# Patient Record
Sex: Female | Born: 1990 | Race: Black or African American | Hispanic: No | Marital: Single | Smoking: Current some day smoker
Health system: Southern US, Community
[De-identification: ages and names within clinical notes are randomized; demographics above are authoritative.]

## PROBLEM LIST (undated history)

## (undated) ENCOUNTER — Inpatient Hospital Stay (HOSPITAL_COMMUNITY): Payer: No Typology Code available for payment source

## (undated) ENCOUNTER — Ambulatory Visit: Payer: Commercial Managed Care - HMO

## (undated) DIAGNOSIS — F1291 Cannabis use, unspecified, in remission: Secondary | ICD-10-CM

## (undated) DIAGNOSIS — F419 Anxiety disorder, unspecified: Secondary | ICD-10-CM

## (undated) DIAGNOSIS — J939 Pneumothorax, unspecified: Secondary | ICD-10-CM

## (undated) DIAGNOSIS — S329XXA Fracture of unspecified parts of lumbosacral spine and pelvis, initial encounter for closed fracture: Secondary | ICD-10-CM

## (undated) HISTORY — PX: NO PAST SURGERIES: SHX2092

---
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
## 2012-01-31 ENCOUNTER — Encounter (HOSPITAL_COMMUNITY): Payer: Self-pay | Admitting: *Deleted

## 2012-01-31 ENCOUNTER — Inpatient Hospital Stay (HOSPITAL_COMMUNITY)
Admission: AD | Admit: 2012-01-31 | Discharge: 2012-01-31 | Disposition: A | Discharge status: Home / Self Care | Payer: Self-pay | Source: Ambulatory Visit | Attending: Family Medicine | Admitting: Family Medicine

## 2012-01-31 DIAGNOSIS — B9689 Other specified bacterial agents as the cause of diseases classified elsewhere: Secondary | ICD-10-CM | POA: Insufficient documentation

## 2012-01-31 DIAGNOSIS — Z202 Contact with and (suspected) exposure to infections with a predominantly sexual mode of transmission: Secondary | ICD-10-CM | POA: Diagnosis present

## 2012-01-31 DIAGNOSIS — Z3202 Encounter for pregnancy test, result negative: Secondary | ICD-10-CM | POA: Insufficient documentation

## 2012-01-31 DIAGNOSIS — N912 Amenorrhea, unspecified: Secondary | ICD-10-CM | POA: Insufficient documentation

## 2012-01-31 DIAGNOSIS — N760 Acute vaginitis: Secondary | ICD-10-CM | POA: Insufficient documentation

## 2012-01-31 DIAGNOSIS — N911 Secondary amenorrhea: Secondary | ICD-10-CM | POA: Diagnosis present

## 2012-01-31 DIAGNOSIS — A499 Bacterial infection, unspecified: Secondary | ICD-10-CM | POA: Insufficient documentation

## 2012-01-31 HISTORY — DX: Contact with and (suspected) exposure to infections with a predominantly sexual mode of transmission: Z20.2

## 2012-01-31 LAB — URINALYSIS, ROUTINE W REFLEX MICROSCOPIC
Bilirubin Urine: NEGATIVE
Ketones, ur: NEGATIVE mg/dL
Leukocytes, UA: NEGATIVE
Nitrite: NEGATIVE
Protein, ur: NEGATIVE mg/dL
Urobilinogen, UA: 0.2 mg/dL (ref 0.0–1.0)
pH: 8.5 — ABNORMAL HIGH (ref 5.0–8.0)

## 2012-01-31 LAB — CBC
Hemoglobin: 11.5 g/dL — ABNORMAL LOW (ref 12.0–15.0)
MCH: 24.4 pg — ABNORMAL LOW (ref 26.0–34.0)
MCHC: 32.6 g/dL (ref 30.0–36.0)
RDW: 15.3 % (ref 11.5–15.5)

## 2012-01-31 LAB — WET PREP, GENITAL: Yeast Wet Prep HPF POC: NONE SEEN

## 2012-01-31 MED ORDER — METRONIDAZOLE 500 MG PO TABS: 500.0000 mg | ORAL_TABLET | Freq: Two times a day (BID) | ORAL | Status: DC

## 2012-01-31 MED ORDER — ONDANSETRON 8 MG PO TBDP
8.0000 mg | ORAL_TABLET | ORAL | Status: AC
  Administered 2012-01-31: 8 mg via ORAL
  Filled 2012-01-31: qty 1

## 2012-01-31 MED ORDER — CEFTRIAXONE SODIUM 250 MG IJ SOLR
250.0000 mg | INTRAMUSCULAR | Status: AC
  Administered 2012-01-31: 250 mg via INTRAMUSCULAR
  Filled 2012-01-31: qty 250

## 2012-01-31 MED ORDER — AZITHROMYCIN 250 MG PO TABS
1000.0000 mg | ORAL_TABLET | Freq: Once | ORAL | Status: AC
  Administered 2012-01-31: 1000 mg via ORAL
  Filled 2012-01-31: qty 4

## 2012-01-31 NOTE — MAU Provider Note (Signed)
Chief Complaint: Possible Pregnancy   First Provider Initiated Contact with Patient 01/31/12 2016     SUBJECTIVE HPI: Victoria Cline is a 21 y.o. G2P0020 at who presents to maternity admissions reporting positive UPT at home in June, last LMP in May, but no weight gain or pregnancy symptoms.  She denies abdominal pain, vaginal bleeding, vaginal itching/burning, urinary symptoms, h/a, dizziness, n/v, or fever/chills.    After initial history taking, pt reports concern about exposure to STIs.  She requested treatment before her labs today come back.  Past Medical History  Diagnosis Date  . Asthma   . Anxiety    Past Surgical History  Procedure Date  . No past surgeries    History   Social History  . Marital Status: Single    Spouse Name: N/A    Number of Children: N/A  . Years of Education: N/A   Occupational History  . Not on file.   Social History Main Topics  . Smoking status: Current Every Day Smoker -- 0.2 packs/day for 5 years    Types: Cigarettes  . Smokeless tobacco: Not on file  . Alcohol Use: No  . Drug Use: No  . Sexually Active: Yes    Birth Control/ Protection: None   Other Topics Concern  . Not on file   Social History Narrative  . No narrative on file   No current facility-administered medications on file prior to encounter.   No current outpatient prescriptions on file prior to encounter.   No Known Allergies  ROS: Pertinent items in HPI  OBJECTIVE Blood pressure 114/72, pulse 74, temperature 98 F (36.7 C), temperature source Oral, resp. rate 16, height 5\' 3"  (1.6 m), weight 48.807 kg (107 lb 9.6 oz). GENERAL: Well-developed, well-nourished female in no acute distress.  HEENT: Normocephalic HEART: normal rate RESP: normal effort ABDOMEN: Soft, non-tender EXTREMITIES: Nontender, no edema NEURO: Alert and oriented Pelvic exam: Cervix pink, visually closed, without lesion, moderate amount white creamy discharge, vaginal walls and external  genitalia normal Bimanual exam: Cervix 0/long/high, firm, posterior, neg CMT, uterus nontender, nonenlarged, adnexa without tenderness, enlargement, or mass  LAB RESULTS Results for orders placed during the hospital encounter of 01/31/12 (from the past 24 hour(s))  URINALYSIS, ROUTINE W REFLEX MICROSCOPIC     Status: Abnormal   Collection Time   01/31/12  7:52 PM      Component Value Range   Color, Urine YELLOW  YELLOW   APPearance CLEAR  CLEAR   Specific Gravity, Urine 1.020  1.005 - 1.030   pH 8.5 (*) 5.0 - 8.0   Glucose, UA NEGATIVE  NEGATIVE mg/dL   Hgb urine dipstick NEGATIVE  NEGATIVE   Bilirubin Urine NEGATIVE  NEGATIVE   Ketones, ur NEGATIVE  NEGATIVE mg/dL   Protein, ur NEGATIVE  NEGATIVE mg/dL   Urobilinogen, UA 0.2  0.0 - 1.0 mg/dL   Nitrite NEGATIVE  NEGATIVE   Leukocytes, UA NEGATIVE  NEGATIVE  POCT PREGNANCY, URINE     Status: Normal   Collection Time   01/31/12  8:02 PM      Component Value Range   Preg Test, Ur NEGATIVE  NEGATIVE  WET PREP, GENITAL     Status: Abnormal   Collection Time   01/31/12  8:25 PM      Component Value Range   Yeast Wet Prep HPF POC NONE SEEN  NONE SEEN   Trich, Wet Prep NONE SEEN  NONE SEEN   Clue Cells Wet Prep HPF POC MANY (*)  NONE SEEN   WBC, Wet Prep HPF POC FEW (*) NONE SEEN  CBC     Status: Abnormal   Collection Time   01/31/12  8:31 PM      Component Value Range   WBC 7.9  4.0 - 10.5 K/uL   RBC 4.71  3.87 - 5.11 MIL/uL   Hemoglobin 11.5 (*) 12.0 - 15.0 g/dL   HCT 81.1 (*) 91.4 - 78.2 %   MCV 74.9 (*) 78.0 - 100.0 fL   MCH 24.4 (*) 26.0 - 34.0 pg   MCHC 32.6  30.0 - 36.0 g/dL   RDW 95.6  21.3 - 08.6 %   Platelets 296  150 - 400 K/uL    ASSESSMENT 1. Negative pregnancy test   2. Exposure to STD   3. Secondary amenorrhea   4. Bacterial vaginosis     PLAN Rocephin 250 mg IM, Zofran 8 mg ODT, and azithromycin 1000 mg PO x1 dose in MAU Discharge home Reviewed negative pregnancy test results with pt Message sent to  Gyn clinic for f/u for amenorrhea Flagyl 500 mg BID x7 days Return to MAU as needed  Sharen Counter Certified Nurse-Midwife 01/31/2012  8:32 PM

## 2012-01-31 NOTE — MAU Note (Signed)
Pt LMP in May, + home UPT in June.  No prenatal care.  Pt concerned because she is not gaining weight.  Denies pain, bleeding, nausea or vomiting at this time.

## 2012-02-01 LAB — GC/CHLAMYDIA PROBE AMP, GENITAL
Chlamydia, DNA Probe: NEGATIVE
GC Probe Amp, Genital: NEGATIVE

## 2012-02-01 NOTE — MAU Provider Note (Signed)
Chart reviewed and agree with management and plan.  

## 2012-06-10 ENCOUNTER — Emergency Department (HOSPITAL_COMMUNITY): Payer: Self-pay

## 2012-06-10 ENCOUNTER — Emergency Department (HOSPITAL_COMMUNITY)
Admission: EM | Admit: 2012-06-10 | Discharge: 2012-06-10 | Disposition: A | Discharge status: Home / Self Care | Payer: Self-pay | Attending: Emergency Medicine | Admitting: Emergency Medicine

## 2012-06-10 DIAGNOSIS — F17200 Nicotine dependence, unspecified, uncomplicated: Secondary | ICD-10-CM | POA: Insufficient documentation

## 2012-06-10 DIAGNOSIS — R079 Chest pain, unspecified: Secondary | ICD-10-CM | POA: Insufficient documentation

## 2012-06-10 DIAGNOSIS — Z3202 Encounter for pregnancy test, result negative: Secondary | ICD-10-CM | POA: Insufficient documentation

## 2012-06-10 DIAGNOSIS — F411 Generalized anxiety disorder: Secondary | ICD-10-CM | POA: Insufficient documentation

## 2012-06-10 DIAGNOSIS — R1110 Vomiting, unspecified: Secondary | ICD-10-CM | POA: Insufficient documentation

## 2012-06-10 DIAGNOSIS — N390 Urinary tract infection, site not specified: Secondary | ICD-10-CM | POA: Insufficient documentation

## 2012-06-10 DIAGNOSIS — J45909 Unspecified asthma, uncomplicated: Secondary | ICD-10-CM | POA: Insufficient documentation

## 2012-06-10 DIAGNOSIS — R1013 Epigastric pain: Secondary | ICD-10-CM | POA: Insufficient documentation

## 2012-06-10 LAB — URINALYSIS, ROUTINE W REFLEX MICROSCOPIC
Bilirubin Urine: NEGATIVE
Glucose, UA: NEGATIVE mg/dL
Nitrite: NEGATIVE
Specific Gravity, Urine: 1.027 (ref 1.005–1.030)
pH: 7.5 (ref 5.0–8.0)

## 2012-06-10 LAB — COMPREHENSIVE METABOLIC PANEL
ALT: 17 U/L (ref 0–35)
AST: 19 U/L (ref 0–37)
Alkaline Phosphatase: 67 U/L (ref 39–117)
Calcium: 9.9 mg/dL (ref 8.4–10.5)
Potassium: 3.9 mEq/L (ref 3.5–5.1)
Sodium: 134 mEq/L — ABNORMAL LOW (ref 135–145)
Total Protein: 8.3 g/dL (ref 6.0–8.3)

## 2012-06-10 LAB — CBC WITH DIFFERENTIAL/PLATELET
Basophils Absolute: 0 10*3/uL (ref 0.0–0.1)
Eosinophils Absolute: 0 10*3/uL (ref 0.0–0.7)
Eosinophils Relative: 0 % (ref 0–5)
Lymphocytes Relative: 12 % (ref 12–46)
MCH: 24.7 pg — ABNORMAL LOW (ref 26.0–34.0)
MCV: 73.6 fL — ABNORMAL LOW (ref 78.0–100.0)
Neutrophils Relative %: 85 % — ABNORMAL HIGH (ref 43–77)
Platelets: 357 10*3/uL (ref 150–400)
RBC: 5.23 MIL/uL — ABNORMAL HIGH (ref 3.87–5.11)
RDW: 15.1 % (ref 11.5–15.5)
WBC: 9.7 10*3/uL (ref 4.0–10.5)

## 2012-06-10 LAB — URINE MICROSCOPIC-ADD ON

## 2012-06-10 MED ORDER — PROMETHAZINE HCL 25 MG PO TABS: 25.0000 mg | ORAL_TABLET | Freq: Four times a day (QID) | ORAL | Status: DC | PRN

## 2012-06-10 MED ORDER — ONDANSETRON HCL 4 MG/2ML IJ SOLN
4.0000 mg | Freq: Once | INTRAMUSCULAR | Status: AC
  Administered 2012-06-10: 4 mg via INTRAVENOUS
  Filled 2012-06-10: qty 2

## 2012-06-10 MED ORDER — HYDROMORPHONE HCL PF 1 MG/ML IJ SOLN
0.5000 mg | Freq: Once | INTRAMUSCULAR | Status: AC
  Administered 2012-06-10: 0.5 mg via INTRAVENOUS
  Filled 2012-06-10: qty 1

## 2012-06-10 MED ORDER — TRAMADOL HCL 50 MG PO TABS: 50.0000 mg | ORAL_TABLET | Freq: Four times a day (QID) | ORAL | Status: DC | PRN

## 2012-06-10 MED ORDER — CEPHALEXIN 500 MG PO CAPS: 500.0000 mg | ORAL_CAPSULE | Freq: Four times a day (QID) | ORAL | Status: DC

## 2012-06-10 MED ORDER — SODIUM CHLORIDE 0.9 % IV BOLUS (SEPSIS)
1000.0000 mL | Freq: Once | INTRAVENOUS | Status: AC
  Administered 2012-06-10: 1000 mL via INTRAVENOUS

## 2012-06-10 MED ORDER — LORAZEPAM 2 MG/ML IJ SOLN
0.5000 mg | Freq: Once | INTRAMUSCULAR | Status: AC
  Administered 2012-06-10: 0.5 mg via INTRAVENOUS
  Filled 2012-06-10: qty 1

## 2012-06-10 MED ORDER — PANTOPRAZOLE SODIUM 40 MG IV SOLR
40.0000 mg | Freq: Once | INTRAVENOUS | Status: AC
  Administered 2012-06-10: 40 mg via INTRAVENOUS
  Filled 2012-06-10: qty 40

## 2012-06-10 NOTE — ED Notes (Signed)
Pt family and pt report pt has been vomiting since last night multiple times. Pt reports central chest pain  10/10, aching, burning that started this morning. Hx of asthma and anxiety. Pt has not been able to keep fluids down.

## 2012-06-10 NOTE — ED Provider Notes (Signed)
History     CSN: [id number]  Arrival date & time 06/10/12  4098   First MD Initiated Contact with Patient 06/10/12 [phone]      Chief Complaint  Patient presents with  . Abdominal Pain  . Chest Pain    (Consider location/radiation/quality/duration/timing/severity/associated sxs/prior treatment) Patient is a 22 y.o. female presenting with abdominal pain and chest pain. The history is provided by the patient (the pt complains of vomiting).  Abdominal Pain Pain location:  Epigastric Pain quality: aching   Pain radiates to:  Does not radiate Pain severity:  Mild Onset quality:  Sudden Timing:  Constant Progression:  Waxing and waning Chronicity:  New Context: not alcohol use   Associated symptoms: chest pain   Associated symptoms: no cough, no diarrhea, no fatigue and no hematuria   Chest Pain Associated symptoms: abdominal pain   Associated symptoms: no back pain, no cough, no fatigue and no headache     Past Medical History  Diagnosis Date  . Asthma   . Anxiety     Past Surgical History  Procedure Laterality Date  . No past surgeries      No family history on file.  History  Substance Use Topics  . Smoking status: Current Every Day Smoker -- 0.25 packs/day for 5 years    Types: Cigarettes  . Smokeless tobacco: Not on file  . Alcohol Use: No    OB History   Grav Para Term Preterm Abortions TAB SAB Ect Mult Living   2    2  2          Review of Systems  Constitutional: Negative for fatigue.  HENT: Negative for congestion, sinus pressure and ear discharge.   Eyes: Negative for discharge.  Respiratory: Negative for cough.   Cardiovascular: Positive for chest pain.  Gastrointestinal: Positive for abdominal pain. Negative for diarrhea.  Genitourinary: Negative for frequency and hematuria.  Musculoskeletal: Negative for back pain.  Skin: Negative for rash.  Neurological: Negative for seizures and headaches.  Psychiatric/Behavioral: Negative for  hallucinations.    Allergies  Review of patient's allergies indicates no known allergies.  Home Medications   Current Outpatient Rx  Name  Route  Sig  Dispense  Refill  . cephALEXin (KEFLEX) 500 MG capsule   Oral   Take 1 capsule (500 mg total) by mouth 4 (four) times daily.   28 capsule   0   . promethazine (PHENERGAN) 25 MG tablet   Oral   Take 1 tablet (25 mg total) by mouth every 6 (six) hours as needed for nausea.   15 tablet   0   . traMADol (ULTRAM) 50 MG tablet   Oral   Take 1 tablet (50 mg total) by mouth every 6 (six) hours as needed for pain.   15 tablet   0     BP 125/64  Pulse 89  Temp(Src) 98.1 F (36.7 C)  Resp 22  SpO2 100%  LMP 06/10/2012  Physical Exam  Constitutional: She is oriented to person, place, and time. She appears well-developed.  HENT:  Head: Normocephalic and atraumatic.  Eyes: Conjunctivae and EOM are normal. No scleral icterus.  Neck: Neck supple. No thyromegaly present.  Cardiovascular: Normal rate and regular rhythm.  Exam reveals no gallop and no friction rub.   No murmur heard. Pulmonary/Chest: No stridor. She has no wheezes. She has no rales. She exhibits no tenderness.  Abdominal: She exhibits no distension. There is tenderness. There is no rebound.  Musculoskeletal: Normal range  of motion. She exhibits no edema.  Lymphadenopathy:    She has no cervical adenopathy.  Neurological: She is oriented to person, place, and time. Coordination normal.  Skin: No rash noted. No erythema.  Psychiatric: She has a normal mood and affect. Her behavior is normal.    ED Course  Procedures (including critical care time)  Labs Reviewed  CBC WITH DIFFERENTIAL - Abnormal; Notable for the following:    RBC 5.23 (*)    MCV 73.6 (*)    MCH 24.7 (*)    Neutrophils Relative 85 (*)    Neutro Abs 8.2 (*)    All other components within normal limits  COMPREHENSIVE METABOLIC PANEL - Abnormal; Notable for the following:    Sodium 134 (*)     Glucose, Bld 113 (*)    All other components within normal limits  URINALYSIS, ROUTINE W REFLEX MICROSCOPIC - Abnormal; Notable for the following:    APPearance CLOUDY (*)    Hgb urine dipstick MODERATE (*)    Ketones, ur TRACE (*)    Protein, ur 30 (*)    Leukocytes, UA SMALL (*)    All other components within normal limits  URINE MICROSCOPIC-ADD ON - Abnormal; Notable for the following:    Squamous Epithelial / LPF FEW (*)    Bacteria, UA MANY (*)    All other components within normal limits  URINE CULTURE  PREGNANCY, URINE   Dg Abd Acute W/chest  06/10/2012  *RADIOLOGY REPORT*  Clinical Data: Abdomen pain and chest pain.  Nausea and vomiting.  ACUTE ABDOMEN SERIES (ABDOMEN 2 VIEW & CHEST 1 VIEW)  Comparison: Radiographs dated 03/30/2011 and 02/01/2008  Findings: Heart and lungs appear normal.  No free air in the abdomen.  Bowel gas pattern is normal.  No abnormal calcifications. No osseous abnormality.  IMPRESSION: Benign-appearing abdomen and chest.   Original Report Authenticated By: Francene Boyers, M.D.      1. UTI (lower urinary tract infection)   2. Vomiting       MDM          Benny Lennert, MD 06/10/12 1316

## 2012-06-11 LAB — URINE CULTURE: Colony Count: 15000

## 2012-06-24 ENCOUNTER — Emergency Department (HOSPITAL_COMMUNITY): Payer: Self-pay

## 2012-06-24 ENCOUNTER — Emergency Department (HOSPITAL_COMMUNITY)
Admission: EM | Admit: 2012-06-24 | Discharge: 2012-06-24 | Disposition: A | Discharge status: Home / Self Care | Payer: Self-pay | Attending: Emergency Medicine | Admitting: Emergency Medicine

## 2012-06-24 ENCOUNTER — Encounter (HOSPITAL_COMMUNITY): Payer: Self-pay | Admitting: Emergency Medicine

## 2012-06-24 DIAGNOSIS — S43401A Unspecified sprain of right shoulder joint, initial encounter: Secondary | ICD-10-CM

## 2012-06-24 DIAGNOSIS — R209 Unspecified disturbances of skin sensation: Secondary | ICD-10-CM | POA: Insufficient documentation

## 2012-06-24 DIAGNOSIS — Z8659 Personal history of other mental and behavioral disorders: Secondary | ICD-10-CM | POA: Insufficient documentation

## 2012-06-24 DIAGNOSIS — F17200 Nicotine dependence, unspecified, uncomplicated: Secondary | ICD-10-CM | POA: Insufficient documentation

## 2012-06-24 DIAGNOSIS — IMO0002 Reserved for concepts with insufficient information to code with codable children: Secondary | ICD-10-CM | POA: Insufficient documentation

## 2012-06-24 DIAGNOSIS — J45909 Unspecified asthma, uncomplicated: Secondary | ICD-10-CM | POA: Insufficient documentation

## 2012-06-24 MED ORDER — HYDROCODONE-ACETAMINOPHEN 5-325 MG PO TABS: 1.0000 | ORAL_TABLET | Freq: Four times a day (QID) | ORAL | Status: DC | PRN

## 2012-06-24 MED ORDER — HYDROCODONE-ACETAMINOPHEN 5-325 MG PO TABS
1.0000 | ORAL_TABLET | Freq: Once | ORAL | Status: AC
  Administered 2012-06-24: 1 via ORAL
  Filled 2012-06-24: qty 1
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
## 2012-06-24 NOTE — ED Provider Notes (Signed)
Medical screening examination/treatment/procedure(s) were performed by non-physician practitioner and as supervising physician I was immediately available for consultation/collaboration.   David H Yao, MD 06/24/12 1146 

## 2012-08-01 ENCOUNTER — Emergency Department (HOSPITAL_COMMUNITY)
Admission: EM | Admit: 2012-08-01 | Discharge: 2012-08-01 | Disposition: A | Discharge status: Home / Self Care | Payer: Self-pay | Attending: Emergency Medicine | Admitting: Emergency Medicine

## 2012-08-01 ENCOUNTER — Encounter (HOSPITAL_COMMUNITY): Payer: Self-pay | Admitting: Emergency Medicine

## 2012-08-01 DIAGNOSIS — IMO0001 Reserved for inherently not codable concepts without codable children: Secondary | ICD-10-CM | POA: Insufficient documentation

## 2012-08-01 DIAGNOSIS — R51 Headache: Secondary | ICD-10-CM | POA: Insufficient documentation

## 2012-08-01 DIAGNOSIS — O99330 Smoking (tobacco) complicating pregnancy, unspecified trimester: Secondary | ICD-10-CM | POA: Insufficient documentation

## 2012-08-01 DIAGNOSIS — Z8659 Personal history of other mental and behavioral disorders: Secondary | ICD-10-CM | POA: Insufficient documentation

## 2012-08-01 DIAGNOSIS — J45909 Unspecified asthma, uncomplicated: Secondary | ICD-10-CM | POA: Insufficient documentation

## 2012-08-01 DIAGNOSIS — R112 Nausea with vomiting, unspecified: Secondary | ICD-10-CM

## 2012-08-01 DIAGNOSIS — O210 Mild hyperemesis gravidarum: Secondary | ICD-10-CM | POA: Insufficient documentation

## 2012-08-01 DIAGNOSIS — O9989 Other specified diseases and conditions complicating pregnancy, childbirth and the puerperium: Secondary | ICD-10-CM | POA: Insufficient documentation

## 2012-08-01 LAB — URINALYSIS, ROUTINE W REFLEX MICROSCOPIC
Ketones, ur: >80 mg/dL — AB
Leukocytes, UA: NEGATIVE
Protein, ur: NEGATIVE mg/dL
Urobilinogen, UA: 0.2 mg/dL (ref 0.0–1.0)

## 2012-08-01 LAB — CBC WITH DIFFERENTIAL/PLATELET
Basophils Absolute: 0 10*3/uL (ref 0.0–0.1)
Basophils Relative: 0 % (ref 0–1)
Eosinophils Absolute: 0.1 10*3/uL (ref 0.0–0.7)
Eosinophils Relative: 1 % (ref 0–5)
Lymphocytes Relative: 15 % (ref 12–46)
MCHC: 33.7 g/dL (ref 30.0–36.0)
MCV: 72.7 fL — ABNORMAL LOW (ref 78.0–100.0)
Platelets: 343 10*3/uL (ref 150–400)
RDW: 15.1 % (ref 11.5–15.5)
WBC: 8.2 10*3/uL (ref 4.0–10.5)

## 2012-08-01 LAB — PREGNANCY, URINE: Preg Test, Ur: NEGATIVE

## 2012-08-01 LAB — BASIC METABOLIC PANEL
Calcium: 9.6 mg/dL (ref 8.4–10.5)
Creatinine, Ser: 0.7 mg/dL (ref 0.50–1.10)
GFR calc Af Amer: >90 mL/min (ref >90)
GFR calc non Af Amer: >90 mL/min (ref >90)
Sodium: 138 mEq/L (ref 135–145)

## 2012-08-01 MED ORDER — METOCLOPRAMIDE HCL 5 MG/ML IJ SOLN
10.0000 mg | Freq: Once | INTRAMUSCULAR | Status: AC
  Administered 2012-08-01: 10 mg via INTRAVENOUS
  Filled 2012-08-01: qty 2

## 2012-08-01 MED ORDER — ACETAMINOPHEN 10 MG/ML IV SOLN
1000.0000 mg | Freq: Once | INTRAVENOUS | Status: AC
  Administered 2012-08-01: 1000 mg via INTRAVENOUS
  Filled 2012-08-01: qty 100

## 2012-08-01 MED ORDER — ONDANSETRON HCL 4 MG/2ML IJ SOLN
4.0000 mg | Freq: Once | INTRAMUSCULAR | Status: AC
  Administered 2012-08-01: 4 mg via INTRAVENOUS
  Filled 2012-08-01: qty 2

## 2012-08-01 MED ORDER — DIPHENHYDRAMINE HCL 50 MG/ML IJ SOLN
25.0000 mg | Freq: Once | INTRAMUSCULAR | Status: AC
  Administered 2012-08-01: 25 mg via INTRAVENOUS
  Filled 2012-08-01: qty 1

## 2012-08-01 MED ORDER — PROMETHAZINE HCL 25 MG PO TABS: 25.0000 mg | ORAL_TABLET | Freq: Four times a day (QID) | ORAL | Status: DC | PRN

## 2012-08-01 MED ORDER — SODIUM CHLORIDE 0.9 % IV BOLUS (SEPSIS)
1000.0000 mL | Freq: Once | INTRAVENOUS | Status: AC
  Administered 2012-08-01: 1000 mL via INTRAVENOUS

## 2012-08-01 MED ORDER — PROMETHAZINE HCL 25 MG/ML IJ SOLN
25.0000 mg | Freq: Once | INTRAMUSCULAR | Status: AC
  Administered 2012-08-01: 25 mg via INTRAVENOUS
  Filled 2012-08-01: qty 1

## 2012-08-01 MED ORDER — ONDANSETRON HCL 4 MG/2ML IJ SOLN
INTRAMUSCULAR | Status: AC
  Administered 2012-08-01: 4 mg via INTRAVENOUS
  Filled 2012-08-01: qty 2

## 2012-08-01 MED ORDER — KETOROLAC TROMETHAMINE 30 MG/ML IJ SOLN: 30.0000 mg | Freq: Once | INTRAMUSCULAR | Status: DC

## 2012-08-01 MED ORDER — DIPHENHYDRAMINE HCL 50 MG/ML IJ SOLN: 25.0000 mg | Freq: Once | INTRAMUSCULAR | Status: DC

## 2012-08-01 MED ORDER — ONDANSETRON HCL 4 MG/2ML IJ SOLN: 4.0000 mg | Freq: Once | INTRAMUSCULAR | Status: AC

## 2012-08-01 NOTE — ED Notes (Signed)
PA notified that patient is still vomiting.

## 2012-08-01 NOTE — ED Notes (Signed)
Pt c/o generalized pain, with nausea and vomiting, pt is 4 months pregnant, states she is having small amount of spotting which she has had intermittently since pregnant, denies abdominal cramping at present, vomiting in waiting room and room

## 2012-08-01 NOTE — ED Notes (Signed)
Hold medications until patient urinates and urine sample obtained.

## 2012-08-01 NOTE — ED Provider Notes (Signed)
History     CSN: [id number]  Arrival date & time 08/01/12  1326   First MD Initiated Contact with Patient 08/01/12 1512      No chief complaint on file.   (Consider location/radiation/quality/duration/timing/severity/associated sxs/prior treatment) HPI Comments: Patient is a 22 year old G1P0 female who presents with headache and body aches that patient woke up with this morning. Patient is 4 months pregnant. Patient reports a gradual onset and progressive worsening of the headache. The pain is sharp, constant and is located in generalized head without radiation. The body aches are "all over her body" and severe. Patient has tried nothing for symptoms without relief. No alleviating/aggravating factors. Patient reports associated nausea and vomiting. Patient denies fever, vomiting, diarrhea, numbness/tingling, weakness, visual changes, congestion, chest pain, SOB, abdominal pain.      Past Medical History  Diagnosis Date  . Asthma   . Anxiety     Past Surgical History  Procedure Laterality Date  . No past surgeries      No family history on file.  History  Substance Use Topics  . Smoking status: Current Every Day Smoker -- 0.25 packs/day for 5 years    Types: Cigarettes  . Smokeless tobacco: Never Used  . Alcohol Use: No    OB History   Grav Para Term Preterm Abortions TAB SAB Ect Mult Living   3    2  2          Review of Systems  Gastrointestinal: Positive for nausea and vomiting.  Musculoskeletal: Positive for myalgias.  Neurological: Positive for headaches.  All other systems reviewed and are negative.    Allergies  Review of patient's allergies indicates no known allergies.  Home Medications   Current Outpatient Rx  Name  Route  Sig  Dispense  Refill  . albuterol (PROVENTIL HFA;VENTOLIN HFA) 108 (90 BASE) MCG/ACT inhaler   Inhalation   Inhale 2 puffs into the lungs every 6 (six) hours as needed for wheezing.         . Prenatal Vit-Fe Fumarate-FA  (PRENATAL MULTIVITAMIN) TABS   Oral   Take 1 tablet by mouth daily.           BP 113/79  Pulse 87  Temp(Src) 98.8 F (37.1 C) (Oral)  SpO2 100%  LMP 06/10/2012  Physical Exam  Nursing note and vitals reviewed. Constitutional: She is oriented to person, place, and time. She appears well-developed and well-nourished. No distress.  HENT:  Head: Normocephalic and atraumatic.  Eyes: Conjunctivae and EOM are normal. Pupils are equal, round, and reactive to light. No scleral icterus.  Neck: Normal range of motion. Neck supple.  Cardiovascular: Normal rate and regular rhythm.  Exam reveals no gallop and no friction rub.   No murmur heard. Pulmonary/Chest: Effort normal and breath sounds normal. She has no wheezes. She has no rales. She exhibits no tenderness.  Abdominal: Soft. She exhibits distension. There is no tenderness. There is no rebound and no guarding.  Distension consistent with pregnancy. No tenderness to palpation.   Musculoskeletal: Normal range of motion.  Neurological: She is alert and oriented to person, place, and time. Coordination normal.  Speech is goal-oriented. Moves limbs without ataxia.   Skin: Skin is warm and dry.  Psychiatric: She has a normal mood and affect. Her behavior is normal.    ED Course  Procedures (including critical care time)  Labs Reviewed  CBC WITH DIFFERENTIAL - Abnormal; Notable for the following:    MCV 72.7 (*)  MCH 24.5 (*)    Neutrophils Relative 79 (*)    All other components within normal limits  URINALYSIS, ROUTINE W REFLEX MICROSCOPIC - Abnormal; Notable for the following:    APPearance CLOUDY (*)    Hgb urine dipstick LARGE (*)    Ketones, ur >80 (*)    All other components within normal limits  URINE MICROSCOPIC-ADD ON - Abnormal; Notable for the following:    Bacteria, UA FEW (*)    All other components within normal limits  BASIC METABOLIC PANEL  PREGNANCY, URINE   No results found.   1. Nausea and vomiting        MDM  3:14 PM Basic labs pending. Patient will have fluids and benadryl. Vitals stable and patient is afebrile.   6:53 PM Patient continues to vomit despite 2 doses of zofran. Patient given IV tylenol for pain.   7:11 PM Patient is not pregnant and is now changing her initial history. Patient will be discharged with phenergan for nausea. Vitals stable and patient afebrile. Labs show no signs of dehydration. No further evaluation needed. Patient instructed to return with worsening or concerning symptoms.      Emilia Beck, PA-C 08/01/12 1954

## 2012-08-01 NOTE — ED Notes (Signed)
Patient continues to be nauseated with pain.  PA notified.

## 2012-08-01 NOTE — ED Notes (Signed)
Pt sts she woke this AM and has pain "everywhere" as well as a HA. Pt also stated she has been vomiting this am. Pt sts she is 4 months pregnant. Pt does not have an OB/GYN. Denies any vaginal concerns. Pt has never been pregnant in the past.

## 2012-08-01 NOTE — ED Provider Notes (Signed)
Medical screening examination/treatment/procedure(s) were performed by non-physician practitioner and as supervising physician I was immediately available for consultation/collaboration.    Tonimarie Gritz R Shivangi Lutz, MD 08/01/12 2015 

## 2012-08-08 ENCOUNTER — Emergency Department (HOSPITAL_COMMUNITY)
Admission: EM | Admit: 2012-08-08 | Discharge: 2012-08-08 | Disposition: A | Discharge status: Home / Self Care | Payer: Self-pay

## 2012-08-08 NOTE — ED Notes (Signed)
Pt noted to be cursing during check in and threatening to pass out which was voiced more than once. Pt observed leaving lobby after speaking briefly with Security.

## 2012-12-03 ENCOUNTER — Emergency Department (HOSPITAL_COMMUNITY)
Admission: EM | Admit: 2012-12-03 | Discharge: 2012-12-03 | Disposition: A | Discharge status: Home / Self Care | Payer: Self-pay | Attending: Emergency Medicine | Admitting: Emergency Medicine

## 2012-12-03 ENCOUNTER — Encounter (HOSPITAL_COMMUNITY): Payer: Self-pay | Admitting: Emergency Medicine

## 2012-12-03 DIAGNOSIS — R350 Frequency of micturition: Secondary | ICD-10-CM | POA: Insufficient documentation

## 2012-12-03 DIAGNOSIS — R1084 Generalized abdominal pain: Secondary | ICD-10-CM | POA: Insufficient documentation

## 2012-12-03 DIAGNOSIS — R112 Nausea with vomiting, unspecified: Secondary | ICD-10-CM | POA: Insufficient documentation

## 2012-12-03 DIAGNOSIS — R3915 Urgency of urination: Secondary | ICD-10-CM | POA: Insufficient documentation

## 2012-12-03 DIAGNOSIS — Z3202 Encounter for pregnancy test, result negative: Secondary | ICD-10-CM | POA: Insufficient documentation

## 2012-12-03 DIAGNOSIS — R197 Diarrhea, unspecified: Secondary | ICD-10-CM | POA: Insufficient documentation

## 2012-12-03 DIAGNOSIS — N898 Other specified noninflammatory disorders of vagina: Secondary | ICD-10-CM | POA: Insufficient documentation

## 2012-12-03 DIAGNOSIS — R109 Unspecified abdominal pain: Secondary | ICD-10-CM

## 2012-12-03 DIAGNOSIS — F17200 Nicotine dependence, unspecified, uncomplicated: Secondary | ICD-10-CM | POA: Insufficient documentation

## 2012-12-03 DIAGNOSIS — R52 Pain, unspecified: Secondary | ICD-10-CM | POA: Insufficient documentation

## 2012-12-03 DIAGNOSIS — J45909 Unspecified asthma, uncomplicated: Secondary | ICD-10-CM | POA: Insufficient documentation

## 2012-12-03 DIAGNOSIS — Z8659 Personal history of other mental and behavioral disorders: Secondary | ICD-10-CM | POA: Insufficient documentation

## 2012-12-03 DIAGNOSIS — IMO0001 Reserved for inherently not codable concepts without codable children: Secondary | ICD-10-CM | POA: Insufficient documentation

## 2012-12-03 LAB — COMPREHENSIVE METABOLIC PANEL
Albumin: 3.9 g/dL (ref 3.5–5.2)
Alkaline Phosphatase: 84 U/L (ref 39–117)
BUN: 5 mg/dL — ABNORMAL LOW (ref 6–23)
Chloride: 104 mEq/L (ref 96–112)
Creatinine, Ser: 0.73 mg/dL (ref 0.50–1.10)
GFR calc Af Amer: >90 mL/min (ref >90)
Glucose, Bld: 99 mg/dL (ref 70–99)
Potassium: 4 mEq/L (ref 3.5–5.1)
Total Bilirubin: 0.3 mg/dL (ref 0.3–1.2)

## 2012-12-03 LAB — URINALYSIS, ROUTINE W REFLEX MICROSCOPIC
Bilirubin Urine: NEGATIVE
Ketones, ur: NEGATIVE mg/dL
Leukocytes, UA: NEGATIVE
Nitrite: NEGATIVE
Protein, ur: NEGATIVE mg/dL
Urobilinogen, UA: 0.2 mg/dL (ref 0.0–1.0)

## 2012-12-03 LAB — CBC WITH DIFFERENTIAL/PLATELET
Basophils Relative: 1 % (ref 0–1)
Eosinophils Absolute: 0.1 10*3/uL (ref 0.0–0.7)
HCT: 35.5 % — ABNORMAL LOW (ref 36.0–46.0)
Hemoglobin: 11.2 g/dL — ABNORMAL LOW (ref 12.0–15.0)
Lymphs Abs: 1.6 10*3/uL (ref 0.7–4.0)
MCH: 23.7 pg — ABNORMAL LOW (ref 26.0–34.0)
MCHC: 31.5 g/dL (ref 30.0–36.0)
Monocytes Absolute: 0.3 10*3/uL (ref 0.1–1.0)
Monocytes Relative: 5 % (ref 3–12)
Neutro Abs: 3.6 10*3/uL (ref 1.7–7.7)
Neutrophils Relative %: 64 % (ref 43–77)
RBC: 4.73 MIL/uL (ref 3.87–5.11)

## 2012-12-03 LAB — URINE MICROSCOPIC-ADD ON

## 2012-12-03 LAB — LIPASE, BLOOD: Lipase: 26 U/L (ref 11–59)

## 2012-12-03 MED ORDER — DICYCLOMINE HCL 20 MG PO TABS: 20.0000 mg | ORAL_TABLET | Freq: Two times a day (BID) | ORAL | Status: DC | PRN

## 2012-12-03 MED ORDER — ONDANSETRON HCL 4 MG PO TABS: 4.0000 mg | ORAL_TABLET | Freq: Three times a day (TID) | ORAL | Status: DC | PRN

## 2012-12-03 MED ORDER — SODIUM CHLORIDE 0.9 % IV BOLUS (SEPSIS)
1000.0000 mL | Freq: Once | INTRAVENOUS | Status: AC
  Administered 2012-12-03: 1000 mL via INTRAVENOUS

## 2012-12-03 MED ORDER — ONDANSETRON HCL 4 MG/2ML IJ SOLN
4.0000 mg | Freq: Once | INTRAMUSCULAR | Status: AC
Start: 2012-12-03 — End: 2012-12-03
  Administered 2012-12-03: 4 mg via INTRAVENOUS
  Filled 2012-12-03: qty 2

## 2012-12-03 MED ORDER — KETOROLAC TROMETHAMINE 30 MG/ML IJ SOLN
30.0000 mg | Freq: Once | INTRAMUSCULAR | Status: AC
  Administered 2012-12-03: 30 mg via INTRAVENOUS
  Filled 2012-12-03: qty 1

## 2012-12-03 NOTE — ED Notes (Signed)
MD at bedside. 

## 2012-12-03 NOTE — ED Provider Notes (Signed)
CSN: [id number]     Arrival date & time 12/03/12  1328 History     First MD Initiated Contact with Patient 12/03/12 1405     Chief Complaint  Patient presents with  . Generalized Body Aches  . Nausea  . Emesis  . Diarrhea   (Consider location/radiation/quality/duration/timing/severity/associated sxs/prior Treatment) HPI Comments: Patient reports one day of body aches, N/V/D.  Emesis x 5 - green/yellow, then clear.  Diarrhea x 3.  Is also have urinary urgency and frequency x weeks.  Had unprotected sex 2 months ago. Has had two positive and one negative pregnancy test last month, currently "menstruating," and has had normal periods since the unprotected sex- states women in her family have periods while they are pregnant.  Pt is taking prenatal vitamins but has not seen an OB yet.  If she is pregnant she is G3P0 with two spontaneous abortions at around 3 months.  Denies eating abnormal foods, denies sick contacts, denies recent travel.   Patient is a 22 y.o. female presenting with vomiting and diarrhea. The history is provided by the patient.  Emesis Associated symptoms: abdominal pain, diarrhea and myalgias   Associated symptoms: no chills   Diarrhea Associated symptoms: abdominal pain, myalgias and vomiting   Associated symptoms: no chills and no fever     Past Medical History  Diagnosis Date  . Asthma   . Anxiety    Past Surgical History  Procedure Laterality Date  . No past surgeries     No family history on file. History  Substance Use Topics  . Smoking status: Current Every Day Smoker -- 0.25 packs/day for 5 years    Types: Cigarettes  . Smokeless tobacco: Never Used  . Alcohol Use: No   OB History   Grav Para Term Preterm Abortions TAB SAB Ect Mult Living   3    2  2         Review of Systems  Constitutional: Negative for fever and chills.  Gastrointestinal: Positive for nausea, vomiting, abdominal pain and diarrhea.  Genitourinary: Positive for urgency,  frequency and vaginal bleeding. Negative for dysuria, vaginal discharge and menstrual problem.  Musculoskeletal: Positive for myalgias.    Allergies  Review of patient's allergies indicates no known allergies.  Home Medications   Current Outpatient Rx  Name  Route  Sig  Dispense  Refill  . albuterol (PROVENTIL HFA;VENTOLIN HFA) 108 (90 BASE) MCG/ACT inhaler   Inhalation   Inhale 2 puffs into the lungs every 6 (six) hours as needed for wheezing.         . promethazine (PHENERGAN) 25 MG tablet   Oral   Take 1 tablet (25 mg total) by mouth every 6 (six) hours as needed for nausea.   12 tablet   0    BP 114/71  Pulse 69  Temp(Src) 98.6 F (37 C) (Oral)  Resp 16  SpO2 100%  LMP 10/08/2012 Physical Exam  Nursing note and vitals reviewed. Constitutional: She appears well-developed and well-nourished. No distress.  HENT:  Head: Normocephalic and atraumatic.  Neck: Neck supple.  Cardiovascular: Normal rate and regular rhythm.   Pulmonary/Chest: Effort normal and breath sounds normal. No respiratory distress. She has no wheezes. She has no rales.  Abdominal: Soft. She exhibits no distension. There is generalized tenderness. There is no rebound and no guarding.  Neurological: She is alert.  Skin: She is not diaphoretic.    ED Course   Procedures (including critical care time)  Labs Reviewed  CBC  WITH DIFFERENTIAL - Abnormal; Notable for the following:    Hemoglobin 11.2 (*)    HCT 35.5 (*)    MCV 75.1 (*)    MCH 23.7 (*)    All other components within normal limits  COMPREHENSIVE METABOLIC PANEL - Abnormal; Notable for the following:    BUN 5 (*)    All other components within normal limits  URINALYSIS, ROUTINE W REFLEX MICROSCOPIC - Abnormal; Notable for the following:    Hgb urine dipstick LARGE (*)    All other components within normal limits  LIPASE, BLOOD  URINE MICROSCOPIC-ADD ON  POCT PREGNANCY, URINE   No results found.  I have discussed with the  patient that if she is pregnant, most medications will reach the baby.  I explained that zofran is currently commonly used in pregnancy but there is no guarantee that it will continue to be used or considered safe for her or the baby.  Pt verbalizes understanding and states she would like to have zofran.     3:40 PM Abdominal exam is unchanged.    No diagnosis found.  MDM  Pt with N/V/D and generalized crampy abdominal pain that began today.  Unknown etiology - likely viral vs food poisoning.  Question of whether she might be pregnant because of unprotected sex two months ago and positive pregnancy test over a month ago.  Pregnancy test is negative here.  D/C home with symptomatic medications bentyl and zofran. Discussed all results with patient.  Pt given return precautions.  Pt verbalizes understanding and agrees with plan.      Trixie Dredge, PA-C 12/03/12 1543

## 2012-12-03 NOTE — ED Notes (Signed)
When asked when pt's last period was, pt stated that she didn't know.  I asked if she had gotten one last month.  Pt stated yes.  The LMP that was in epic was in February and said that the pt was pregnant.  Pt states that she lost that baby and now she is 2 months pregnant again (she thinks).  Unsure of due date b/c pt does not know date of LMP.

## 2012-12-03 NOTE — ED Notes (Signed)
Pt c/o body aches, NVD since this morning.  Has thrown up 5 times today.

## 2012-12-03 NOTE — ED Provider Notes (Signed)
Medical screening examination/treatment/procedure(s) were performed by non-physician practitioner and as supervising physician I was immediately available for consultation/collaboration. Devoria Albe, MD, Armando Gang   Ward Givens, MD 12/03/12 [phone]

## 2013-04-25 ENCOUNTER — Emergency Department (HOSPITAL_COMMUNITY)
Admission: EM | Admit: 2013-04-25 | Discharge: 2013-04-25 | Disposition: A | Discharge status: Home / Self Care | Payer: Medicaid Other | Attending: Emergency Medicine | Admitting: Emergency Medicine

## 2013-04-25 ENCOUNTER — Encounter (HOSPITAL_COMMUNITY): Payer: Self-pay | Admitting: Emergency Medicine

## 2013-04-25 DIAGNOSIS — J45909 Unspecified asthma, uncomplicated: Secondary | ICD-10-CM | POA: Insufficient documentation

## 2013-04-25 DIAGNOSIS — F411 Generalized anxiety disorder: Secondary | ICD-10-CM | POA: Insufficient documentation

## 2013-04-25 DIAGNOSIS — R112 Nausea with vomiting, unspecified: Secondary | ICD-10-CM | POA: Insufficient documentation

## 2013-04-25 DIAGNOSIS — F17200 Nicotine dependence, unspecified, uncomplicated: Secondary | ICD-10-CM | POA: Insufficient documentation

## 2013-04-25 DIAGNOSIS — R52 Pain, unspecified: Secondary | ICD-10-CM | POA: Insufficient documentation

## 2013-04-25 MED ORDER — OXYCODONE-ACETAMINOPHEN 5-325 MG PO TABS
1.0000 | ORAL_TABLET | Freq: Once | ORAL | Status: AC
  Administered 2013-04-25: 1 via ORAL
  Filled 2013-04-25: qty 1

## 2013-04-25 MED ORDER — ONDANSETRON HCL 4 MG/2ML IJ SOLN
4.0000 mg | Freq: Once | INTRAMUSCULAR | Status: AC
  Administered 2013-04-25: 4 mg via INTRAVENOUS
  Filled 2013-04-25: qty 2

## 2013-04-25 NOTE — ED Notes (Signed)
Pt presents to department for evaluation of nausea/vomiting and body aches. Onset today. Denies pain at the time. Pt is alert and oriented x4. Denies urinary symptoms. Denies vaginal symptoms. No signs of acute distress noted.

## 2013-04-25 NOTE — ED Notes (Signed)
Pt left. Did not say anything to RN or tech.

## 2013-04-25 NOTE — ED Notes (Signed)
Pt took out IV

## 2013-05-17 ENCOUNTER — Emergency Department (HOSPITAL_COMMUNITY)
Admission: EM | Admit: 2013-05-17 | Discharge: 2013-05-17 | Disposition: A | Discharge status: Home / Self Care | Payer: Medicaid Other | Attending: Emergency Medicine | Admitting: Emergency Medicine

## 2013-05-17 ENCOUNTER — Encounter (HOSPITAL_COMMUNITY): Payer: Self-pay | Admitting: Emergency Medicine

## 2013-05-17 DIAGNOSIS — O9989 Other specified diseases and conditions complicating pregnancy, childbirth and the puerperium: Secondary | ICD-10-CM | POA: Insufficient documentation

## 2013-05-17 DIAGNOSIS — Z8659 Personal history of other mental and behavioral disorders: Secondary | ICD-10-CM | POA: Insufficient documentation

## 2013-05-17 DIAGNOSIS — Z792 Long term (current) use of antibiotics: Secondary | ICD-10-CM | POA: Insufficient documentation

## 2013-05-17 DIAGNOSIS — Z79899 Other long term (current) drug therapy: Secondary | ICD-10-CM | POA: Insufficient documentation

## 2013-05-17 DIAGNOSIS — J069 Acute upper respiratory infection, unspecified: Secondary | ICD-10-CM | POA: Insufficient documentation

## 2013-05-17 DIAGNOSIS — H9209 Otalgia, unspecified ear: Secondary | ICD-10-CM | POA: Insufficient documentation

## 2013-05-17 DIAGNOSIS — Z7982 Long term (current) use of aspirin: Secondary | ICD-10-CM | POA: Insufficient documentation

## 2013-05-17 DIAGNOSIS — O99330 Smoking (tobacco) complicating pregnancy, unspecified trimester: Secondary | ICD-10-CM | POA: Insufficient documentation

## 2013-05-17 DIAGNOSIS — Z3490 Encounter for supervision of normal pregnancy, unspecified, unspecified trimester: Secondary | ICD-10-CM

## 2013-05-17 DIAGNOSIS — R112 Nausea with vomiting, unspecified: Secondary | ICD-10-CM | POA: Insufficient documentation

## 2013-05-17 DIAGNOSIS — O2390 Unspecified genitourinary tract infection in pregnancy, unspecified trimester: Secondary | ICD-10-CM | POA: Insufficient documentation

## 2013-05-17 DIAGNOSIS — N390 Urinary tract infection, site not specified: Secondary | ICD-10-CM | POA: Insufficient documentation

## 2013-05-17 DIAGNOSIS — J45909 Unspecified asthma, uncomplicated: Secondary | ICD-10-CM | POA: Insufficient documentation

## 2013-05-17 DIAGNOSIS — R197 Diarrhea, unspecified: Secondary | ICD-10-CM | POA: Insufficient documentation

## 2013-05-17 LAB — URINALYSIS, ROUTINE W REFLEX MICROSCOPIC
BILIRUBIN URINE: NEGATIVE
Glucose, UA: NEGATIVE mg/dL
KETONES UR: 15 mg/dL — AB
Nitrite: POSITIVE — AB
PROTEIN: NEGATIVE mg/dL
Specific Gravity, Urine: 1.015 (ref 1.005–1.030)
UROBILINOGEN UA: 0.2 mg/dL (ref 0.0–1.0)
pH: 7 (ref 5.0–8.0)

## 2013-05-17 LAB — URINE MICROSCOPIC-ADD ON

## 2013-05-17 LAB — POCT I-STAT, CHEM 8
BUN: 5 mg/dL — ABNORMAL LOW (ref 6–23)
CALCIUM ION: 1.29 mmol/L — AB (ref 1.12–1.23)
CREATININE: 0.7 mg/dL (ref 0.50–1.10)
Chloride: 102 mEq/L (ref 96–112)
GLUCOSE: 93 mg/dL (ref 70–99)
HCT: 44 % (ref 36.0–46.0)
HEMOGLOBIN: 15 g/dL (ref 12.0–15.0)
POTASSIUM: 3.7 meq/L (ref 3.7–5.3)
Sodium: 138 mEq/L (ref 137–147)
TCO2: 25 mmol/L (ref 0–100)

## 2013-05-17 MED ORDER — SODIUM CHLORIDE 0.9 % IV BOLUS (SEPSIS)
1000.0000 mL | Freq: Once | INTRAVENOUS | Status: AC
  Administered 2013-05-17: 1000 mL via INTRAVENOUS

## 2013-05-17 MED ORDER — NITROFURANTOIN MONOHYD MACRO 100 MG PO CAPS
100.0000 mg | ORAL_CAPSULE | Freq: Once | ORAL | Status: AC
  Administered 2013-05-17: 100 mg via ORAL
  Filled 2013-05-17: qty 1

## 2013-05-17 MED ORDER — NITROFURANTOIN MONOHYD MACRO 100 MG PO CAPS: 100.0000 mg | ORAL_CAPSULE | Freq: Two times a day (BID) | ORAL | Status: DC

## 2013-05-17 MED ORDER — AZITHROMYCIN 250 MG PO TABS: 500.0000 mg | ORAL_TABLET | Freq: Once | ORAL | Status: DC

## 2013-05-17 MED ORDER — SODIUM CHLORIDE 0.9 % IV SOLN
1000.0000 mL | Freq: Once | INTRAVENOUS | Status: AC
  Administered 2013-05-17: 1000 mL via INTRAVENOUS

## 2013-05-17 MED ORDER — ONDANSETRON HCL 4 MG/2ML IJ SOLN
4.0000 mg | Freq: Once | INTRAMUSCULAR | Status: AC
  Administered 2013-05-17: 4 mg via INTRAVENOUS
  Filled 2013-05-17: qty 2

## 2013-05-17 MED ORDER — AZITHROMYCIN 250 MG PO TABS: 250.0000 mg | ORAL_TABLET | Freq: Every day | ORAL | Status: DC

## 2013-05-17 MED ORDER — PRENATAL COMPLETE 14-0.4 MG PO TABS: 1.0000 | ORAL_TABLET | Freq: Two times a day (BID) | ORAL | Status: DC

## 2013-05-17 MED ORDER — OXYCODONE-ACETAMINOPHEN 5-325 MG PO TABS
1.0000 | ORAL_TABLET | Freq: Once | ORAL | Status: AC
  Administered 2013-05-17: 1 via ORAL
  Filled 2013-05-17: qty 1

## 2013-05-17 NOTE — ED Provider Notes (Signed)
CSN: [id number]     Arrival date & time 05/17/13  1502 History   First MD Initiated Contact with Patient 05/17/13 1734     Chief Complaint  Patient presents with  . Flu like symptoms    (Consider location/radiation/quality/duration/timing/severity/associated sxs/prior Treatment) HPI  Patient is a 23 yo AA female who presents today with flu-like symptoms ongoing for about a week. Patient recently found out with home pregnancy test she is approximately 4-[redacted] weeks pregnant. Has not had any prenatal care and is currently taking OTC prenatal vitamins. States she has generalized body aches, fatigue, a persistent dry cough, and a runny nose. Also complains of bilateral ear pain. Has had nausea and vomiting for the past three days, with about three episodes of vomiting per day without pain in her flank or abdomen. Recent decrease in fluid and food intake. Has noticed recent odor change in urine and slight discomfort with urination, but denies blood in urine or color change. Denies chest pain, headaches, abdominal pain, diarrhea, hematochezia, and hematemesis. Patient is afebrile and has not taken any medication for fever and BP is 116/63.    Past Medical History  Diagnosis Date  . Asthma   . Anxiety    Past Surgical History  Procedure Laterality Date  . No past surgeries     History reviewed. No pertinent family history. History  Substance Use Topics  . Smoking status: Current Every Day Smoker -- 0.25 packs/day for 5 years    Types: Cigarettes  . Smokeless tobacco: Never Used  . Alcohol Use: No   OB History   Grav Para Term Preterm Abortions TAB SAB Ect Mult Living   3    2  2         Review of Systems  Constitutional: Positive for fatigue. Negative for fever.  HENT: Positive for congestion, ear pain and rhinorrhea.        Nasal congestion with yellow drainage. Bilateral ear pain.   Respiratory: Negative for chest tightness and wheezing.   Cardiovascular: Negative for chest pain.   Gastrointestinal: Positive for nausea, vomiting and diarrhea. Negative for abdominal pain and blood in stool.  Genitourinary: Negative for frequency and flank pain.       States some mild discomfort with urination. Has noticed recent odor change in urine. States "feels like I have a UTI"     Allergies  Review of patient's allergies indicates no known allergies.  Home Medications   Current Outpatient Rx  Name  Route  Sig  Dispense  Refill  . albuterol (PROVENTIL HFA;VENTOLIN HFA) 108 (90 BASE) MCG/ACT inhaler   Inhalation   Inhale 2 puffs into the lungs every 6 (six) hours as needed for wheezing.         [name] aspirin 325 MG tablet   Oral   Take 1,300 mg by mouth daily as needed for moderate pain.          . nitrofurantoin, macrocrystal-monohydrate, (MACROBID) 100 MG capsule   Oral   Take 1 capsule (100 mg total) by mouth 2 (two) times daily.   10 capsule   0   . Prenatal Vit-Fe Fumarate-FA (PRENATAL COMPLETE) 14-0.4 MG TABS   Oral   Take 1 tablet by mouth 2 (two) times daily.   60 each   2    BP 116/63  Pulse 103  Temp(Src) 98.9 F (37.2 C) (Oral)  Resp 16  Wt 130 lb (58.968 kg)  SpO2 100%  LMP 03/19/2013 Physical Exam  Constitutional: She is  oriented to person, place, and time. She appears well-developed and well-nourished.  HENT:  Head: Normocephalic.  Mouth/Throat: Oropharynx is clear and moist. No oropharyngeal exudate.  Patient states some mild discomfort during examination of ear. Some redness along both canals. Both canals clear of any exudate and both TMs visualized. Nasal passages clear with some clear rhinorrhea.   Eyes: Conjunctivae are normal. Pupils are equal, round, and reactive to light.  Cardiovascular: Normal rate, regular rhythm and normal heart sounds.   Pulmonary/Chest: Effort normal and breath sounds normal. No respiratory distress. She has no wheezes. She has no rales.  Abdominal: Soft. Bowel sounds are normal. There is no tenderness.   Neurological: She is alert and oriented to person, place, and time.    ED Course  Procedures (including critical care time) Labs Review Labs Reviewed  URINALYSIS, ROUTINE W REFLEX MICROSCOPIC - Abnormal; Notable for the following:    Hgb urine dipstick TRACE (*)    Ketones, ur 15 (*)    Nitrite POSITIVE (*)    Leukocytes, UA TRACE (*)    All other components within normal limits  URINE MICROSCOPIC-ADD ON - Abnormal; Notable for the following:    Squamous Epithelial / LPF MANY (*)    Bacteria, UA MANY (*)    All other components within normal limits  POCT I-STAT, CHEM 8 - Abnormal; Notable for the following:    BUN 5 (*)    Calcium, Ion 1.29 (*)    All other components within normal limits  URINE CULTURE  URINE CULTURE   Imaging Review No results found.  EKG Interpretation   None       MDM   1. Pregnant   2. URI (upper respiratory infection)   3. UTI (lower urinary tract infection)    Patient received 2 L of saline in the. Nurse has documented her pulse at 110. When coughing the pulse does elevate but in the room, patient resting it was in the high 80's.  Her I-stat and urine tests are not concerning.  I feel that this is more bronchitis, I did not xray to avoid excess radiation during pregnancy.   [facility]  22 y.o.Manuella GhaziSamira Najaah Fossum's evaluation in the Emergency Department is complete. It has been determined that no acute conditions requiring further emergency intervention are present at this time. The patient/guardian have been advised of the diagnosis and plan. We have discussed signs and symptoms that warrant return to the ED, such as changes or worsening in symptoms.  Vital signs are stable at discharge. Filed Vitals:   05/17/13 1830  BP: 116/63  Pulse: 103  Temp:   Resp:     Patient/guardian has voiced understanding and agreed to follow-up with the PCP or specialist.     Dorthula Matasiffany G Khalidah Herbold, PA-C 05/17/13  1942

## 2013-05-17 NOTE — ED Notes (Signed)
PA at bedside.

## 2013-05-17 NOTE — Discharge Instructions (Signed)
Urinary Tract Infection Urinary tract infections (UTIs) can develop anywhere along your urinary tract. Your urinary tract is your body's drainage system for removing wastes and extra water. Your urinary tract includes two kidneys, two ureters, a bladder, and a urethra. Your kidneys are a pair of bean-shaped organs. Each kidney is about the size of your fist. They are located below your ribs, one on each side of your spine. CAUSES Infections are caused by microbes, which are microscopic organisms, including fungi, viruses, and bacteria. These organisms are so small that they can only be seen through a microscope. Bacteria are the microbes that most commonly cause UTIs. SYMPTOMS  Symptoms of UTIs may vary by age and gender of the patient and by the location of the infection. Symptoms in young women typically include a frequent and intense urge to urinate and a painful, burning feeling in the bladder or urethra during urination. Older women and men are more likely to be tired, shaky, and weak and have muscle aches and abdominal pain. A fever may mean the infection is in your kidneys. Other symptoms of a kidney infection include pain in your back or sides below the ribs, nausea, and vomiting. DIAGNOSIS To diagnose a UTI, your caregiver will ask you about your symptoms. Your caregiver also will ask to provide a urine sample. The urine sample will be tested for bacteria and white blood cells. White blood cells are made by your body to help fight infection. TREATMENT  Typically, UTIs can be treated with medication. Because most UTIs are caused by a bacterial infection, they usually can be treated with the use of antibiotics. The choice of antibiotic and length of treatment depend on your symptoms and the type of bacteria causing your infection. HOME CARE INSTRUCTIONS  If you were prescribed antibiotics, take them exactly as your caregiver instructs you. Finish the medication even if you feel better after you  have only taken some of the medication.  Drink enough water and fluids to keep your urine clear or pale yellow.  Avoid caffeine, tea, and carbonated beverages. They tend to irritate your bladder.  Empty your bladder often. Avoid holding urine for long periods of time.  Empty your bladder before and after sexual intercourse.  After a bowel movement, women should cleanse from front to back. Use each tissue only once. SEEK MEDICAL CARE IF:   You have back pain.  You develop a fever.  Your symptoms do not begin to resolve within 3 days. SEEK IMMEDIATE MEDICAL CARE IF:   You have severe back pain or lower abdominal pain.  You develop chills.  You have nausea or vomiting.  You have continued burning or discomfort with urination. MAKE SURE YOU:   Understand these instructions.  Will watch your condition.  Will get help right away if you are not doing well or get worse. Document Released: 01/07/2005 Document Revised: 09/29/2011 Document Reviewed: 05/08/2011 [facility] Patient Information 2014 [place].  Bronchitis Bronchitis is inflammation of the airways that extend from the windpipe into the lungs (bronchi). The inflammation often causes mucus to develop, which leads to a cough. If the inflammation becomes severe, it may cause shortness of breath. CAUSES  Bronchitis may be caused by:   Viral infections.   Bacteria.   Cigarette smoke.   Allergens, pollutants, and other irritants.  SIGNS AND SYMPTOMS  The most common symptom of bronchitis is a frequent cough that produces mucus. Other symptoms include:  Fever.   Body aches.   Chest congestion.  Chills.   Shortness of breath.   Sore throat.  DIAGNOSIS  Bronchitis is usually diagnosed through a medical history and physical exam. Tests, such as chest X-rays, are sometimes done to rule out other conditions.  TREATMENT  You may need to avoid contact with whatever caused the problem (smoking, for  example). Medicines are sometimes needed. These may include:  Antibiotics. These may be prescribed if the condition is caused by bacteria.  Cough suppressants. These may be prescribed for relief of cough symptoms.   Inhaled medicines. These may be prescribed to help open your airways and make it easier for you to breathe.   Steroid medicines. These may be prescribed for those with recurrent (chronic) bronchitis. HOME CARE INSTRUCTIONS  Get plenty of rest.   Drink enough fluids to keep your urine clear or pale yellow (unless you have a medical condition that requires fluid restriction). Increasing fluids may help thin your secretions and will prevent dehydration.   Only take over-the-counter or prescription medicines as directed by your health care provider.  Only take antibiotics as directed. Make sure you finish them even if you start to feel better.  Avoid secondhand smoke, irritating chemicals, and strong fumes. These will make bronchitis worse. If you are a smoker, quit smoking. Consider using nicotine gum or skin patches to help control withdrawal symptoms. Quitting smoking will help your lungs heal faster.   Put a cool-mist humidifier in your bedroom at night to moisten the air. This may help loosen mucus. Change the water in the humidifier daily. You can also run the hot water in your shower and sit in the bathroom with the door closed for 5 10 minutes.   Follow up with your health care provider as directed.   Wash your hands frequently to avoid catching bronchitis again or spreading an infection to others.  SEEK MEDICAL CARE IF: Your symptoms do not improve after 1 week of treatment.  SEEK IMMEDIATE MEDICAL CARE IF:  Your fever increases.  You have chills.   You have chest pain.   You have worsening shortness of breath.   You have bloody sputum.  You faint.  You have lightheadedness.  You have a severe headache.   You vomit repeatedly. MAKE SURE  YOU:   Understand these instructions.  Will watch your condition.  Will get help right away if you are not doing well or get worse. Document Released: 03/30/2005 Document Revised: 01/18/2013 Document Reviewed: 11/22/2012 [facility] Patient Information 2014 [place], [place]. Pregnancy - First Trimester During sexual intercourse, millions of sperm go into the vagina. Only 1 sperm will penetrate and fertilize the female egg while it is in the Fallopian tube. One week later, the fertilized egg implants into the wall of the uterus. An embryo begins to develop into a baby. At 6 to 8 weeks, the eyes and face are formed and the heartbeat can be seen on ultrasound. At the end of 12 weeks (first trimester), all the baby's organs are formed. Now that you are pregnant, you will want to do everything you can to have a healthy baby. Two of the most important things are to get good prenatal care and follow your caregiver's instructions. Prenatal care is all the medical care you receive before the baby's birth. It is given to prevent, find, and treat problems during the pregnancy and childbirth. PRENATAL EXAMS  During prenatal visits, your weight, blood pressure, and urine are checked. This is done to make sure you are healthy and progressing normally  during the pregnancy.  A pregnant woman should gain 25 to 35 pounds during the pregnancy. However, if you are overweight or underweight, your caregiver will advise you regarding your weight.  Your caregiver will ask and answer questions for you.  Blood work, cervical cultures, other necessary tests, and a Pap test are done during your prenatal exams. These tests are done to check on your health and the probable health of your baby. Tests are strongly recommended and done for HIV with your permission. This is the virus that causes AIDS. These tests are done because medicines can be given to help prevent your baby from being born with this infection should you have been  infected without knowing it. Blood work is also used to find out your blood type, previous infections, and follow your blood levels (hemoglobin).  Low hemoglobin (anemia) is common during pregnancy. Iron and vitamins are given to help prevent this. Later in the pregnancy, blood tests for diabetes will be done along with any other tests if any problems develop.  You may need other tests to make sure you and the baby are doing well. CHANGES DURING THE FIRST TRIMESTER  Your body goes through many changes during pregnancy. They vary from person to person. Talk to your caregiver about changes you notice and are concerned about. Changes can include:  Your menstrual period stops.  The egg and sperm carry the genes that determine what you look like. Genes from you and your partner are forming a baby. The female genes determine whether the baby is a boy or a girl.  Your body increases in girth and you may feel bloated.  Feeling sick to your stomach (nauseous) and throwing up (vomiting). If the vomiting is uncontrollable, call your caregiver.  Your breasts will begin to enlarge and become tender.  Your nipples may stick out more and become darker.  The need to urinate more. Painful urination may mean you have a bladder infection.  Tiring easily.  Loss of appetite.  Cravings for certain kinds of food.  At first, you may gain or lose a couple of pounds.  You may have changes in your emotions from day to day (excited to be pregnant or concerned something may go wrong with the pregnancy and baby).  You may have more vivid and strange dreams. HOME CARE INSTRUCTIONS   It is very important to avoid all smoking, alcohol and non-prescribed drugs during your pregnancy. These affect the formation and growth of the baby. Avoid chemicals while pregnant to ensure the delivery of a healthy infant.  Start your prenatal visits by the 12th week of pregnancy. They are usually scheduled monthly at first, then  more often in the last 2 months before delivery. Keep your caregiver's appointments. Follow your caregiver's instructions regarding medicine use, blood and lab tests, exercise, and diet.  During pregnancy, you are providing food for you and your baby. Eat regular, well-balanced meals. Choose foods such as meat, fish, milk and other low fat dairy products, vegetables, fruits, and whole-grain breads and cereals. Your caregiver will tell you of the ideal weight gain.  You can help morning sickness by keeping soda crackers at the bedside. Eat a couple before arising in the morning. You may want to use the crackers without salt on them.  Eating 4 to 5 small meals rather than 3 large meals a day also may help the nausea and vomiting.  Drinking liquids between meals instead of during meals also seems to help nausea and  vomiting.  A physical sexual relationship may be continued throughout pregnancy if there are no other problems. Problems may be early (premature) leaking of amniotic fluid from the membranes, vaginal bleeding, or belly (abdominal) pain.  Exercise regularly if there are no restrictions. Check with your caregiver or physical therapist if you are unsure of the safety of some of your exercises. Greater weight gain will occur in the last 2 trimesters of pregnancy. Exercising will help:  Control your weight.  Keep you in shape.  Prepare you for labor and delivery.  Help you lose your pregnancy weight after you deliver your baby.  Wear a good support or jogging bra for breast tenderness during pregnancy. This may help if worn during sleep too.  Ask when prenatal classes are available. Begin classes when they are offered.  Do not use hot tubs, steam rooms, or saunas.  Wear your seat belt when driving. This protects you and your baby if you are in an accident.  Avoid raw meat, uncooked cheese, cat litter boxes, and soil used by cats throughout the pregnancy. These carry germs that can  cause birth defects in the baby.  The first trimester is a good time to visit your dentist for your dental health. Getting your teeth cleaned is okay. Use a softer toothbrush and brush gently during pregnancy.  Ask for help if you have financial, counseling, or nutritional needs during pregnancy. Your caregiver will be able to offer counseling for these needs as well as refer you for other special needs.  Do not take any medicines or herbs unless told by your caregiver.  Inform your caregiver if there is any mental or physical domestic violence.  Make a list of emergency phone numbers of family, friends, hospital, and police and fire departments.  Write down your questions. Take them to your prenatal visit.  Do not douche.  Do not cross your legs.  If you have to stand for long periods of time, rotate you feet or take small steps in a circle.  You may have more vaginal secretions that may require a sanitary pad. Do not use tampons or scented sanitary pads. MEDICINES AND DRUG USE IN PREGNANCY  Take prenatal vitamins as directed. The vitamin should contain 1 milligram of folic acid. Keep all vitamins out of reach of children. Only a couple vitamins or tablets containing iron may be fatal to a baby or young child when ingested.  Avoid use of all medicines, including herbs, over-the-counter medicines, not prescribed or suggested by your caregiver. Only take over-the-counter or prescription medicines for pain, discomfort, or fever as directed by your caregiver. Do not use aspirin, ibuprofen, or naproxen unless directed by your caregiver.  Let your caregiver also know about herbs you may be using.  Alcohol is related to a number of birth defects. This includes fetal alcohol syndrome. All alcohol, in any form, should be avoided completely. Smoking will cause low birth rate and premature babies.  Street or illegal drugs are very harmful to the baby. They are absolutely forbidden. A baby born  to an addicted mother will be addicted at birth. The baby will go through the same withdrawal an adult does.  Let your caregiver know about any medicines that you have to take and for what reason you take them. SEEK MEDICAL CARE IF:  You have any concerns or worries during your pregnancy. It is better to call with your questions if you feel they cannot wait, rather than worry about them. SEEK IMMEDIATE  MEDICAL CARE IF:   An unexplained oral temperature above 102 F (38.9 C) develops, or as your caregiver suggests.  You have leaking of fluid from the vagina (birth canal). If leaking membranes are suspected, take your temperature and inform your caregiver of this when you call.  There is vaginal spotting or bleeding. Notify your caregiver of the amount and how many pads are used.  You develop a bad smelling vaginal discharge with a change in the color.  You continue to feel sick to your stomach (nauseated) and have no relief from remedies suggested. You vomit blood or coffee ground-like materials.  You lose more than 2 pounds of weight in 1 week.  You gain more than 2 pounds of weight in 1 week and you notice swelling of your face, hands, feet, or legs.  You gain 5 pounds or more in 1 week (even if you do not have swelling of your hands, face, legs, or feet).  You get exposed to [place] measles and have never had them.  You are exposed to fifth disease or chickenpox.  You develop belly (abdominal) pain. Round ligament discomfort is a common non-cancerous (benign) cause of abdominal pain in pregnancy. Your caregiver still must evaluate this.  You develop headache, fever, diarrhea, pain with urination, or shortness of breath.  You fall or are in a car accident or have any kind of trauma.  There is mental or physical violence in your home. Document Released: 03/24/2001 Document Revised: 12/23/2011 Document Reviewed: 09/25/2008 [facility] Patient Information 2014 [place], [place].  Folic  Acid in Pregnancy Folic acid is a B vitamin that helps prevent neural tube defects (NTDs). The neural tube is the part of a developing baby that becomes the brain and spinal cord. When the neural tube does not close properly, a baby is born with an NTD. NTDs include spina bifida, hernia of the spinal cord, and the absence of part of, or all of the brain (anencephaly).  Take folic acid at least 4 weeks before getting pregnant and through the first 3 months of pregnancy. This is when the neural tube is developing. It is available in most multivitamins, as a folic acid-only supplement, and in some foods. Taking the right amount of folic acid before conception and during pregnancy lessens the chances of having a baby born with an NTD. Giving folic acid will not affect a neural tube defect if it is already present. DIAGNOSIS   An Alpha-Fetoprotein (AFP) blood or amniotic fluid test will show high levels of the alpha-feto protein if a woman is carrying a baby with an NTD. This test is done on all pregnant women in the first trimester.  An ultrasound may detect an NTD. WHAT YOU CAN DO:  Take a multivitamin with at least 0.4 milligrams (400 micrograms) of folic acid daily at least 4 weeks before getting pregnant and through the first 12 weeks of pregnancy.  If you have already had a pregnancy affected by an NTD, take 4 milligrams (4,000 micrograms) of folic acid daily. Take this amount 1 month before you start trying to get pregnant and continue through the first 3 months of pregnancy. If you have a seizure disorder or take medicines to control seizures, tell your maternity care provider. Continue to take your folic acid unless you are told otherwise.  FOLIC ACID IN FOODS Eat a healthy diet that has foods that contain folic acid, the natural form of the vitamin. Such foods include:  Fortified breakfast cereals.  Lentils.  Asparagus.  Spinach.  Organ meats (liver).  Black beans.  Peanuts (eat  only if you do not have a peanut allergy).  Broccoli.  Strawberries, oranges.  Orange juice (from concentrate is best).  Enriched breads and pasta.  Romaine lettuce. TALK TO YOUR HEALTH CARE PROVIDER IF:  You are in your first trimester and have high blood sugar.  You are in your first trimester and develop a high fever. In almost all cases, a fetus found to have an NTD will need specialized care that may not be available in all hospitals. Talk to your health care provider about what is best for you and your baby. Document Released: 04/02/2003 Document Revised: 01/18/2013 Document Reviewed: 07/03/2009 [facility] Patient Information 2014 [place], [place].

## 2013-05-17 NOTE — ED Notes (Addendum)
Pt in c/o cough, congestion, fever and vomiting over the last week, also body aches and chills, no medication PTA. Pt states she is approx 4-[redacted] weeks pregnant. Denies lower abd cramping or vaginal bleeding.

## 2013-05-19 LAB — URINE CULTURE

## 2013-05-19 NOTE — ED Provider Notes (Signed)
Medical screening examination/treatment/procedure(s) were performed by non-physician practitioner and as supervising physician I was immediately available for consultation/collaboration.  EKG Interpretation   None         [name] [name], DO 05/19/13 1427 

## 2013-05-20 ENCOUNTER — Telehealth (HOSPITAL_COMMUNITY): Payer: Self-pay | Admitting: Emergency Medicine

## 2013-05-20 NOTE — ED Notes (Signed)
Post ED Visit - Positive Culture Follow-up  Culture report reviewed by antimicrobial stewardship pharmacist: []  Wes Dulaney, Pharm.D., BCPS [x]  Celedonio MiyamotoJeremy Frens, Pharm.D., BCPS []  Georgina PillionElizabeth Martin, [address]Pharm[address]D., BCPS []  [place] Pham, [place]arm[place] BCPS, AAHIVP []  Estella HuskMichelle Turner, Pharm.D., BCPS, AAHIVP  Positive urine culture Treated with Macrobid, organism sensitive to the same and no further patient follow-up is required at this time.  [place], Victoria LucksKylie 05/20/2013, 9:28 AM

## 2013-08-08 ENCOUNTER — Emergency Department (HOSPITAL_COMMUNITY)
Admission: EM | Admit: 2013-08-08 | Discharge: 2013-08-08 | Discharge status: Against Medical Advice | Payer: Medicaid Other | Attending: Emergency Medicine | Admitting: Emergency Medicine

## 2013-08-08 ENCOUNTER — Emergency Department (HOSPITAL_COMMUNITY): Payer: Medicaid Other

## 2013-08-08 ENCOUNTER — Encounter (HOSPITAL_COMMUNITY): Payer: Self-pay | Admitting: Emergency Medicine

## 2013-08-08 DIAGNOSIS — Z87828 Personal history of other (healed) physical injury and trauma: Secondary | ICD-10-CM | POA: Insufficient documentation

## 2013-08-08 DIAGNOSIS — J45909 Unspecified asthma, uncomplicated: Secondary | ICD-10-CM | POA: Insufficient documentation

## 2013-08-08 DIAGNOSIS — N938 Other specified abnormal uterine and vaginal bleeding: Secondary | ICD-10-CM | POA: Insufficient documentation

## 2013-08-08 DIAGNOSIS — N949 Unspecified condition associated with female genital organs and menstrual cycle: Secondary | ICD-10-CM | POA: Insufficient documentation

## 2013-08-08 DIAGNOSIS — R102 Pelvic and perineal pain: Secondary | ICD-10-CM

## 2013-08-08 DIAGNOSIS — Z8659 Personal history of other mental and behavioral disorders: Secondary | ICD-10-CM | POA: Insufficient documentation

## 2013-08-08 DIAGNOSIS — F17200 Nicotine dependence, unspecified, uncomplicated: Secondary | ICD-10-CM | POA: Insufficient documentation

## 2013-08-08 DIAGNOSIS — N390 Urinary tract infection, site not specified: Secondary | ICD-10-CM | POA: Insufficient documentation

## 2013-08-08 DIAGNOSIS — Z3202 Encounter for pregnancy test, result negative: Secondary | ICD-10-CM | POA: Insufficient documentation

## 2013-08-08 DIAGNOSIS — Z8781 Personal history of (healed) traumatic fracture: Secondary | ICD-10-CM | POA: Insufficient documentation

## 2013-08-08 DIAGNOSIS — Z79899 Other long term (current) drug therapy: Secondary | ICD-10-CM | POA: Insufficient documentation

## 2013-08-08 DIAGNOSIS — N925 Other specified irregular menstruation: Secondary | ICD-10-CM | POA: Insufficient documentation

## 2013-08-08 HISTORY — DX: Fracture of unspecified parts of lumbosacral spine and pelvis, initial encounter for closed fracture: S32.9XXA

## 2013-08-08 LAB — URINE MICROSCOPIC-ADD ON

## 2013-08-08 LAB — URINALYSIS, ROUTINE W REFLEX MICROSCOPIC
Bilirubin Urine: NEGATIVE
GLUCOSE, UA: NEGATIVE mg/dL
KETONES UR: NEGATIVE mg/dL
Nitrite: POSITIVE — AB
PH: 6 (ref 5.0–8.0)
Protein, ur: NEGATIVE mg/dL
Specific Gravity, Urine: 1.019 (ref 1.005–1.030)
Urobilinogen, UA: 0.2 mg/dL (ref 0.0–1.0)

## 2013-08-08 LAB — HCG, QUANTITATIVE, PREGNANCY: hCG, Beta Chain, Quant, S: <1 m[IU]/mL (ref <5)

## 2013-08-08 LAB — POC URINE PREG, ED: Preg Test, Ur: NEGATIVE

## 2013-08-08 MED ORDER — MORPHINE SULFATE 4 MG/ML IJ SOLN
4.0000 mg | Freq: Once | INTRAMUSCULAR | Status: AC
  Administered 2013-08-08: 4 mg via INTRAMUSCULAR
  Filled 2013-08-08: qty 1

## 2013-08-08 MED ORDER — KETOROLAC TROMETHAMINE 60 MG/2ML IM SOLN: 15.0000 mg | Freq: Once | INTRAMUSCULAR | Status: DC

## 2013-08-08 MED ORDER — PREDNISONE 20 MG PO TABS
60.0000 mg | ORAL_TABLET | Freq: Once | ORAL | Status: AC
  Administered 2013-08-08: 60 mg via ORAL
  Filled 2013-08-08: qty 3

## 2013-08-08 MED ORDER — DEXAMETHASONE SODIUM PHOSPHATE 10 MG/ML IJ SOLN: 10.0000 mg | Freq: Once | INTRAMUSCULAR | Status: DC

## 2013-08-08 NOTE — ED Notes (Signed)
Pt not in room to receive d/c instructions.  

## 2013-08-08 NOTE — ED Notes (Signed)
Pt states that when she was [age]yr old she had an accident and broke her pelvic bone. For past week pt c/o of pelvic pain. Pt denies any trouble using the bathroom.

## 2013-08-08 NOTE — ED Notes (Signed)
Pt has a ride home.  

## 2013-08-08 NOTE — ED Notes (Signed)
Patient transported to X-ray 
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
## 2013-08-10 LAB — URINE CULTURE
Colony Count: >=100000
SPECIAL REQUESTS: NORMAL

## 2013-08-10 NOTE — ED Provider Notes (Signed)
Medical screening examination/treatment/procedure(s) were performed by non-physician practitioner and as supervising physician I was immediately available for consultation/collaboration.   EKG Interpretation None       Taquila Leys, MD 08/10/13 1321 

## 2013-08-11 ENCOUNTER — Telehealth (HOSPITAL_BASED_OUTPATIENT_CLINIC_OR_DEPARTMENT_OTHER): Payer: Self-pay | Admitting: Emergency Medicine

## 2013-08-11 NOTE — Progress Notes (Signed)
ED Antimicrobial Stewardship Positive Culture Follow Up   Victoria GhaziSamira Najaah Cline is an 23 y.o. female who [facility] with a chief complaint of pelvic pain  Chief Complaint  Patient presents with  . Pelvic Pain    Recent Results (from the past 720 hour(s))  URINE CULTURE     Status: None   Collection Time    08/08/13  5:26 PM      Result Value Ref Range Status   Specimen Description URINE, CLEAN CATCH   Final   Special Requests Normal   Final   Culture  Setup Time     Final   Value: 08/09/2013 01:03     Performed at [facility]   Colony Count     Final   Value: >=100,000 COLONIES/ML     [facility]formed at [facility]   Culture     Final   Value: ESCHERICHIA COLI     Performed at [facility]   Report Status 08/10/2013 FINAL   Final   Organism ID, Bacteria ESCHERICHIA COLI   Final    [x]  Patient discharged originally without antimicrobial agent and treatment is now indicated  22 YOF who [facility] with c/o pelvic pain (pt has hx broke pelvic bone ~2 years ago). Work-up revealed a dirty UA however pt left prior to further testing (i.e. CT of the pelvis). UCx grew E. Coli -- would recommend treating.   New antibiotic prescription: Bactrim DS 1 tab bid x 5 days  ED Provider: Ebbie Ridgehris Lawyer, PA-C  [name] 08/11/2013, 9:45 AM Infectious Diseases Pharmacist Phone# [phone]

## 2013-08-11 NOTE — Telephone Encounter (Signed)
Post ED Visit - Positive Culture Follow-up: Successful Patient Follow-Up  Culture assessed and recommendations reviewed by: []  Wes Dulaney, Pharm.D., BCPS []  Celedonio MiyamotoJeremy Frens, Pharm.D., BCPS [x]  Georgina PillionElizabeth Martin, [address] BoulevardPharm.D., BCPS []  HeathcoteMinh Pham, [place]arm[place]D., BCPS, AAHIVP []  Estella HuskMichelle Turner, Pharm.D., BCPS, AAHIVP  Positive urine culture  [x]  Patient discharged without antimicrobial prescription and treatment is now indicated []  Organism is resistant to prescribed ED discharge antimicrobial []  Patient with positive blood cultures  Changes discussed with ED provider: Ebbie Ridgehris Lawyer PA-C New antibiotic prescription: Bactrim DS 1 tab BID x 5 days    [facility] Cline 08/11/2013, 2:47 PM

## 2013-10-31 ENCOUNTER — Encounter (HOSPITAL_COMMUNITY): Payer: Self-pay | Admitting: Emergency Medicine

## 2013-10-31 ENCOUNTER — Emergency Department (HOSPITAL_COMMUNITY)
Admission: EM | Admit: 2013-10-31 | Discharge: 2013-10-31 | Disposition: A | Discharge status: Home / Self Care | Payer: Medicaid Other | Attending: Emergency Medicine | Admitting: Emergency Medicine

## 2013-10-31 ENCOUNTER — Emergency Department (HOSPITAL_COMMUNITY): Payer: Medicaid Other

## 2013-10-31 DIAGNOSIS — I319 Disease of pericardium, unspecified: Secondary | ICD-10-CM | POA: Diagnosis not present

## 2013-10-31 DIAGNOSIS — R079 Chest pain, unspecified: Secondary | ICD-10-CM | POA: Diagnosis present

## 2013-10-31 DIAGNOSIS — F419 Anxiety disorder, unspecified: Secondary | ICD-10-CM

## 2013-10-31 DIAGNOSIS — Z8781 Personal history of (healed) traumatic fracture: Secondary | ICD-10-CM | POA: Insufficient documentation

## 2013-10-31 DIAGNOSIS — F411 Generalized anxiety disorder: Secondary | ICD-10-CM | POA: Insufficient documentation

## 2013-10-31 DIAGNOSIS — J45901 Unspecified asthma with (acute) exacerbation: Secondary | ICD-10-CM | POA: Insufficient documentation

## 2013-10-31 DIAGNOSIS — Z79899 Other long term (current) drug therapy: Secondary | ICD-10-CM | POA: Diagnosis not present

## 2013-10-31 DIAGNOSIS — F17200 Nicotine dependence, unspecified, uncomplicated: Secondary | ICD-10-CM | POA: Insufficient documentation

## 2013-10-31 MED ORDER — LORAZEPAM 1 MG PO TABS
1.0000 mg | ORAL_TABLET | Freq: Once | ORAL | Status: AC
  Administered 2013-10-31: 1 mg via ORAL
  Filled 2013-10-31: qty 1

## 2013-10-31 MED ORDER — IBUPROFEN 600 MG PO TABS: 600.0000 mg | ORAL_TABLET | Freq: Three times a day (TID) | ORAL | Status: DC

## 2013-10-31 MED ORDER — KETOROLAC TROMETHAMINE 30 MG/ML IJ SOLN
30.0000 mg | Freq: Once | INTRAMUSCULAR | Status: AC
  Administered 2013-10-31: 30 mg via INTRAVENOUS
  Filled 2013-10-31: qty 1
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
## 2013-10-31 NOTE — ED Notes (Signed)
Pt ambulated with standby assist to nearby restroom to void.  

## 2013-10-31 NOTE — ED Notes (Addendum)
Pt states "my heart hurts and my body is numb." Pt is teary.
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
## 2013-10-31 NOTE — ED Notes (Signed)
Bed: WA11 Expected date:  Expected time:  Means of arrival:  Comments: EMS 

## 2013-11-01 NOTE — ED Provider Notes (Signed)
Medical screening examination/treatment/procedure(s) were performed by non-physician practitioner and as supervising physician I was immediately available for consultation/collaboration.   EKG Interpretation   Date/Time:  Tuesday October 31 2013 06:26:04 EDT Ventricular Rate:  77 PR Interval:  125 QRS Duration: 89 QT Interval:  403 QTC Calculation: 456 R Axis:   90 Text Interpretation:  Sinus rhythm Borderline right axis deviation RSR' in  V1 or V2, probably normal variant Probable left ventricular hypertrophy ST  elevation suggests acute pericarditis Confirmed by WARD,  DO, KRISTEN  [phone] on 10/31/2013 8:22:38 AM        Victoria CoKevin M Lorelie Biermann, MD 11/01/13 0400

## 2013-11-23 ENCOUNTER — Encounter: Payer: Self-pay | Admitting: *Deleted

## 2013-11-28 ENCOUNTER — Ambulatory Visit (INDEPENDENT_AMBULATORY_CARE_PROVIDER_SITE_OTHER): Payer: Medicaid Other | Admitting: Cardiology

## 2013-11-28 ENCOUNTER — Encounter: Payer: Self-pay | Admitting: Cardiology

## 2013-11-28 VITALS — BP 120/80 | HR 94 | Ht 63.0 in

## 2013-11-28 DIAGNOSIS — R002 Palpitations: Secondary | ICD-10-CM

## 2013-11-28 LAB — TSH: TSH: 1.989 u[IU]/mL (ref 0.350–4.500)

## 2013-11-28 NOTE — Progress Notes (Signed)
HPI The patient presents for evaluation of chest discomfort. She has no past cardiac history. She was in the emergency room in late July I reviewed these. She told her that they have fluid around her heart but I see no evidence of this. In particular a chest x-ray demonstrated no edema. The patient reports that daily with any activity or emotional stress her heart will race. She also developed chest discomfort which is under her left breast and in the mid chest. It is sharp. It is 10 out of 10 in intensity. She gets nauseated short of breath and diaphoretic. He maintains hours before he goes away. She has been told in the past she has panic attacks but I am not clear on this workup for this diagnosis was made. She limits her activities because of this discomfort. He denies any PND or orthopnea. She's had no presyncope or syncope. She has had multiple emergency room visits for various complaints.  No Known Allergies  Current Outpatient Prescriptions  Medication Sig Dispense Refill  . albuterol (PROVENTIL HFA;VENTOLIN HFA) 108 (90 BASE) MCG/ACT inhaler Inhale 2 puffs into the lungs every 6 (six) hours as needed for wheezing.      [name] ibuprofen (ADVIL,MOTRIN) 600 MG tablet Take 1 tablet (600 mg total) by mouth every 8 (eight) hours.  42 tablet  0   No current facility-administered medications for this visit.    Past Medical History  Diagnosis Date  . Asthma   . Anxiety   . Fracture, pelvis closed     Past Surgical History  Procedure Laterality Date  . No past surgeries      No family history on file.  History   Social History  . Marital Status: Single    Spouse Name: N/A    Number of Children: N/A  . Years of Education: N/A   Occupational History  . Not on file.   Social History Main Topics  . Smoking status: Current Every Day Smoker -- 0.25 packs/day for 5 years    Types: Cigarettes  . Smokeless tobacco: Never Used  . Alcohol Use: No  . Drug Use: No  . Sexual Activity: Yes     Birth Control/ Protection: None   Other Topics Concern  . Not on file   Social History Narrative  . No narrative on file    ROS:  Positive for dizziness, reflux, feet swelling.  Otherwise as stated in the HPI and negative for all other systems.  PHYSICAL EXAM BP 120/80  Pulse 94  Ht 5\' 3"  (1.6 m)  LMP 10/11/2013 GENERAL:  Well appearing HEENT:  Pupils equal round and reactive, fundi not visualized, oral mucosa unremarkable NECK:  No jugular venous distention, waveform within normal limits, carotid upstroke brisk and symmetric, no bruits, no thyromegaly LYMPHATICS:  No cervical, inguinal adenopathy LUNGS:  Clear to auscultation bilaterally BACK:  No CVA tenderness CHEST:  Unremarkable HEART:  PMI not displaced or sustained,S1 and S2 within normal limits, no S3, no S4, no clicks, no rubs, no murmurs ABD:  Flat, positive bowel sounds normal in frequency in pitch, no bruits, no rebound, no guarding, no midline pulsatile mass, no hepatomegaly, no splenomegaly EXT:  2 plus pulses throughout, no edema, no cyanosis no clubbing SKIN:  No rashes no nodules NEURO:  Cranial nerves II through XII grossly intact, motor grossly intact throughout PSYCH:  Cognitively intact, oriented to person place and time   EKG:  Sinus arrhythmia, rate 94, axis within normal limits, intervals within  normal limits, early repolarization pattern, no acute ST-T wave changes. 11/28/2013  ASSESSMENT AND PLAN  PALPITATIONS:  Since these are these are occurring a daily basis I will place occuring on a daily basis I should be capturing event with a 48 hour monitor. I will also check a TSH.  CHEST PAIN:  This is very atypical. The pretest probability of obstructive coronary disease is very low and therefore stress testing would be of no value. I do not also suspect structural heart disease.  No further testing is planned.  She will be referred to see if she can establish with a primary care MD.

## 2013-11-28 NOTE — Patient Instructions (Signed)
Your physician recommends that you schedule a follow-up appointment in: as needed  We are ordering a 48 hr monitor to evaluate your palptations  We are ordering some Bloodwork for you to get done  We encourage you to obtain a primary care doctor

## 2013-11-30 ENCOUNTER — Encounter: Payer: Self-pay | Admitting: *Deleted

## 2013-11-30 ENCOUNTER — Encounter (INDEPENDENT_AMBULATORY_CARE_PROVIDER_SITE_OTHER): Payer: Medicaid Other

## 2013-11-30 DIAGNOSIS — R002 Palpitations: Secondary | ICD-10-CM

## 2013-11-30 NOTE — Progress Notes (Signed)
Patient ID: Victoria Cline, female   DOB: 02/05/1991, 23 y.o.   MRN: [id number] [facility] 48 hour holter monitor applied to patient.

## 2013-12-19 ENCOUNTER — Encounter (HOSPITAL_COMMUNITY): Payer: Self-pay | Admitting: Emergency Medicine

## 2013-12-19 ENCOUNTER — Emergency Department (HOSPITAL_COMMUNITY)
Admission: EM | Admit: 2013-12-19 | Discharge: 2013-12-19 | Discharge status: Against Medical Advice | Payer: Medicaid Other | Attending: Emergency Medicine | Admitting: Emergency Medicine

## 2013-12-19 DIAGNOSIS — Z79899 Other long term (current) drug therapy: Secondary | ICD-10-CM | POA: Diagnosis not present

## 2013-12-19 DIAGNOSIS — Z8781 Personal history of (healed) traumatic fracture: Secondary | ICD-10-CM | POA: Diagnosis not present

## 2013-12-19 DIAGNOSIS — J45901 Unspecified asthma with (acute) exacerbation: Secondary | ICD-10-CM | POA: Diagnosis not present

## 2013-12-19 DIAGNOSIS — F411 Generalized anxiety disorder: Secondary | ICD-10-CM | POA: Diagnosis not present

## 2013-12-19 DIAGNOSIS — J069 Acute upper respiratory infection, unspecified: Secondary | ICD-10-CM | POA: Diagnosis not present

## 2013-12-19 DIAGNOSIS — F17200 Nicotine dependence, unspecified, uncomplicated: Secondary | ICD-10-CM | POA: Insufficient documentation

## 2013-12-19 DIAGNOSIS — J3489 Other specified disorders of nose and nasal sinuses: Secondary | ICD-10-CM | POA: Insufficient documentation

## 2013-12-19 DIAGNOSIS — R6883 Chills (without fever): Secondary | ICD-10-CM | POA: Diagnosis not present

## 2013-12-19 LAB — POC URINE PREG, ED: PREG TEST UR: NEGATIVE

## 2013-12-19 MED ORDER — IPRATROPIUM-ALBUTEROL 0.5-2.5 (3) MG/3ML IN SOLN: 3.0000 mL | Freq: Once | RESPIRATORY_TRACT | Status: DC

## 2013-12-19 MED ORDER — PREDNISONE 20 MG PO TABS: 60.0000 mg | ORAL_TABLET | Freq: Once | ORAL | Status: DC

## 2013-12-19 NOTE — ED Provider Notes (Signed)
CSN: [id number]     Arrival date & time 12/19/13  1856 History  This chart was scribed for Victoria Drown, PA-C working with Arby Barrette, MD by Evon Slack, ED Scribe. This patient was seen in room TR07C/TR07C and the patient's care was started at 8:21 PM.  Chief Complaint  Patient presents with  . Nasal Congestion  . Cough   HPI Comments: Takeela Peil is a 23 y.o. female with a Hx of asthma who presents to the Emergency Department complaining of nasal congestion onset 3 days prior. She state she has associated non productive cough, rhinorrhea, fever and intermittent chills. She sattes she has tried Nyquil and OTC cough medicine with no relief. She states she has recently been exposed to 1 sick contact. She states she recently used the last of her inhaler last night. She denies SOB, chest pain.   Patient is a 23 y.o. female presenting with cough. The history is provided by the patient. No language interpreter was used.  Cough Associated symptoms: chills, fever, rhinorrhea and wheezing   Associated symptoms: no chest pain and no shortness of breath      Past Medical History  Diagnosis Date  . Asthma   . Anxiety   . Fracture, pelvis closed    Past Surgical History  Procedure Laterality Date  . No past surgeries     Family History  Problem Relation Age of Onset  . Cancer Mother     Living, brain tumor   History  Substance Use Topics  . Smoking status: Current Every Day Smoker -- 0.25 packs/day for 5 years    Types: Cigarettes  . Smokeless tobacco: Never Used  . Alcohol Use: No   OB History   Grav Para Term Preterm Abortions TAB SAB Ect Mult Living   Review of Systems  Constitutional: Positive for fever and chills.  HENT: Positive for congestion and rhinorrhea.   Respiratory: Positive for cough and wheezing. Negative for shortness of breath.   Cardiovascular: Negative for chest pain.   Allergies  Review of patient's allergies indicates no  known allergies.  Home Medications   Prior to Admission medications   Medication Sig Start Date End Date Taking? Authorizing Provider  albuterol (PROVENTIL HFA;VENTOLIN HFA) 108 (90 BASE) MCG/ACT inhaler Inhale 2 puffs into the lungs every 6 (six) hours as needed for wheezing.    Historical Provider, MD  ibuprofen (ADVIL,MOTRIN) 600 MG tablet Take 1 tablet (600 mg total) by mouth every 8 (eight) hours. 10/31/13   Santiago Glad, PA-C   Triage Vitals: BP 112/68  Pulse 98  Temp(Src) 98.1 F (36.7 C) (Oral)  Resp 18  SpO2 100%  LMP 11/18/2013  Physical Exam  Nursing note and vitals reviewed. Constitutional: She is oriented to person, place, and time. She appears well-developed and well-nourished. No distress.  HENT:  Head: Normocephalic and atraumatic.  Right Ear: Tympanic membrane normal. Tympanic membrane is not erythematous, not retracted and not bulging.  Left Ear: Tympanic membrane normal. Tympanic membrane is not erythematous, not retracted and not bulging.  Nose: Rhinorrhea present.  Mouth/Throat: Uvula is midline and mucous membranes are normal. No trismus in the jaw. Posterior oropharyngeal erythema present.  Eyes: Conjunctivae and EOM are normal.  Neck: Neck supple. No tracheal deviation present.  Cardiovascular: Normal rate.   Pulmonary/Chest: Effort normal. Not tachypneic. No respiratory distress. She has no decreased breath sounds. She has wheezes. She has rhonchi.  She has no rales.  Mild wheezing in bilateral lung fields left slightly greater than right. Patient is able to speak in complete sentences.   Musculoskeletal: Normal range of motion.  Neurological: She is alert and oriented to person, place, and time.  Skin: Skin is warm and dry.  Psychiatric: She has a normal mood and affect. Her behavior is normal.    ED Course  Procedures (including critical care time) DIAGNOSTIC STUDIES: Oxygen Saturation is 100% on RA, normal by my interpretation.    COORDINATION OF  CARE: 8:26 PM-Discussed treatment plan which includes breathing treatment with pt at bedside and pt agreed to plan.     Labs Review Labs Reviewed  POC URINE PREG, ED    Imaging Review No results found.   EKG Interpretation None      MDM   Final diagnoses:  URI, acute  Asthma exacerbation   Patient presents with upper respiratory symptoms history of asthma, in no acute distress, oxygen saturation 100% on room air, mild wheezing bilaterally slight increase on the left lower lung fields no rhonchi. Patient afebrile, no tachycardia. Likely viral URI and asthma exacerbation will treat with 1 DuoNeb and prednisone. And will reassess. High fived friend after hearing about pregnancy results. After discussing treatment plan with the patient and placing order patient left emergency department in NAD AMA. Meds given in ED:  Medications  ipratropium-albuterol (DUONEB) 0.5-2.5 (3) MG/3ML nebulizer solution 3 mL (not administered)  predniSONE (DELTASONE) tablet 60 mg (not administered)    Discharge Medication List as of 12/19/2013  8:31 PM     I personally performed the services described in this documentation, which was scribed in my presence. The recorded information has been reviewed and is accurate.         Victoria Drown, PA-C 12/19/13 2034

## 2013-12-19 NOTE — ED Notes (Signed)
Pt also wants pregnancy test. PA aware and gave VO order.

## 2013-12-19 NOTE — ED Notes (Signed)
Pt left after PA evaluated pt.

## 2013-12-19 NOTE — ED Notes (Signed)
Pt reports nasal congestion and non productive cough x 3 days. Reports intermittent chills. Denies SOB. Denies CP but states "it just hurts when I cough." Pt in NAD. 98% RA.

## 2013-12-21 ENCOUNTER — Emergency Department (HOSPITAL_COMMUNITY): Payer: Medicaid Other

## 2013-12-21 ENCOUNTER — Encounter (HOSPITAL_COMMUNITY): Payer: Self-pay | Admitting: Emergency Medicine

## 2013-12-21 ENCOUNTER — Emergency Department (HOSPITAL_COMMUNITY)
Admission: EM | Admit: 2013-12-21 | Discharge: 2013-12-22 | Discharge status: Against Medical Advice | Payer: Medicaid Other | Attending: Emergency Medicine | Admitting: Emergency Medicine

## 2013-12-21 DIAGNOSIS — R0602 Shortness of breath: Secondary | ICD-10-CM | POA: Insufficient documentation

## 2013-12-21 DIAGNOSIS — J45901 Unspecified asthma with (acute) exacerbation: Secondary | ICD-10-CM | POA: Insufficient documentation

## 2013-12-21 DIAGNOSIS — F17200 Nicotine dependence, unspecified, uncomplicated: Secondary | ICD-10-CM | POA: Diagnosis not present

## 2013-12-21 DIAGNOSIS — Z8781 Personal history of (healed) traumatic fracture: Secondary | ICD-10-CM | POA: Diagnosis not present

## 2013-12-21 MED ORDER — IPRATROPIUM BROMIDE 0.02 % IN SOLN
RESPIRATORY_TRACT | Status: AC
  Administered 2013-12-21: 0.5 mg
  Filled 2013-12-21: qty 2.5

## 2013-12-21 MED ORDER — ALBUTEROL SULFATE (2.5 MG/3ML) 0.083% IN NEBU
INHALATION_SOLUTION | RESPIRATORY_TRACT | Status: AC
  Administered 2013-12-21: 21:00:00
  Filled 2013-12-21: qty 3

## 2013-12-21 NOTE — ED Provider Notes (Signed)
Medical screening examination/treatment/procedure(s) were performed by non-physician practitioner and as supervising physician I was immediately available for consultation/collaboration.   EKG Interpretation None       Arby Barrette, MD 12/21/13 [phone]

## 2013-12-21 NOTE — ED Notes (Signed)
Pt complains of being short of breath earlier today, friend states her breathing became worse on the way here. Pt states no relief with her inhaler

## 2013-12-22 NOTE — ED Notes (Signed)
Pt called again from triage, no answer, registration saw her walk out

## 2013-12-22 NOTE — ED Notes (Signed)
Pt called from triage, no answer 

## 2014-02-12 ENCOUNTER — Encounter (HOSPITAL_COMMUNITY): Payer: Self-pay | Admitting: Emergency Medicine

## 2014-02-22 ENCOUNTER — Emergency Department (HOSPITAL_COMMUNITY)
Admission: EM | Admit: 2014-02-22 | Discharge: 2014-02-22 | Disposition: A | Discharge status: Home / Self Care | Payer: Medicaid Other | Attending: Emergency Medicine | Admitting: Emergency Medicine

## 2014-02-22 ENCOUNTER — Encounter (HOSPITAL_COMMUNITY): Payer: Self-pay

## 2014-02-22 DIAGNOSIS — M791 Myalgia: Secondary | ICD-10-CM | POA: Insufficient documentation

## 2014-02-22 DIAGNOSIS — Z720 Tobacco use: Secondary | ICD-10-CM | POA: Diagnosis not present

## 2014-02-22 DIAGNOSIS — R1110 Vomiting, unspecified: Secondary | ICD-10-CM | POA: Diagnosis not present

## 2014-02-22 DIAGNOSIS — Z3202 Encounter for pregnancy test, result negative: Secondary | ICD-10-CM | POA: Diagnosis not present

## 2014-02-22 DIAGNOSIS — R5383 Other fatigue: Secondary | ICD-10-CM | POA: Diagnosis not present

## 2014-02-22 DIAGNOSIS — R109 Unspecified abdominal pain: Secondary | ICD-10-CM

## 2014-02-22 DIAGNOSIS — Z8659 Personal history of other mental and behavioral disorders: Secondary | ICD-10-CM | POA: Diagnosis not present

## 2014-02-22 DIAGNOSIS — R197 Diarrhea, unspecified: Secondary | ICD-10-CM | POA: Insufficient documentation

## 2014-02-22 DIAGNOSIS — Z79899 Other long term (current) drug therapy: Secondary | ICD-10-CM | POA: Diagnosis not present

## 2014-02-22 DIAGNOSIS — Z8781 Personal history of (healed) traumatic fracture: Secondary | ICD-10-CM | POA: Diagnosis not present

## 2014-02-22 DIAGNOSIS — J45909 Unspecified asthma, uncomplicated: Secondary | ICD-10-CM | POA: Diagnosis not present

## 2014-02-22 DIAGNOSIS — R05 Cough: Secondary | ICD-10-CM | POA: Insufficient documentation

## 2014-02-22 LAB — CBC WITH DIFFERENTIAL/PLATELET
BASOS ABS: 0 10*3/uL (ref 0.0–0.1)
BASOS PCT: 0 % (ref 0–1)
EOS ABS: 0.1 10*3/uL (ref 0.0–0.7)
Eosinophils Relative: 2 % (ref 0–5)
HCT: 34 % — ABNORMAL LOW (ref 36.0–46.0)
HEMOGLOBIN: 10.9 g/dL — AB (ref 12.0–15.0)
Lymphocytes Relative: 16 % (ref 12–46)
Lymphs Abs: 1.3 10*3/uL (ref 0.7–4.0)
MCH: 24.7 pg — ABNORMAL LOW (ref 26.0–34.0)
MCHC: 32.1 g/dL (ref 30.0–36.0)
MCV: 77.1 fL — ABNORMAL LOW (ref 78.0–100.0)
Monocytes Absolute: 0.4 10*3/uL (ref 0.1–1.0)
Monocytes Relative: 5 % (ref 3–12)
NEUTROS ABS: 6.3 10*3/uL (ref 1.7–7.7)
NEUTROS PCT: 77 % (ref 43–77)
Platelets: 324 10*3/uL (ref 150–400)
RBC: 4.41 MIL/uL (ref 3.87–5.11)
RDW: 14.9 % (ref 11.5–15.5)
WBC: 8.1 10*3/uL (ref 4.0–10.5)

## 2014-02-22 LAB — URINALYSIS, ROUTINE W REFLEX MICROSCOPIC
Bilirubin Urine: NEGATIVE
GLUCOSE, UA: NEGATIVE mg/dL
Ketones, ur: NEGATIVE mg/dL
LEUKOCYTES UA: NEGATIVE
Nitrite: NEGATIVE
PROTEIN: NEGATIVE mg/dL
SPECIFIC GRAVITY, URINE: 1.03 (ref 1.005–1.030)
Urobilinogen, UA: 0.2 mg/dL (ref 0.0–1.0)
pH: 6.5 (ref 5.0–8.0)

## 2014-02-22 LAB — COMPREHENSIVE METABOLIC PANEL
ALBUMIN: 4 g/dL (ref 3.5–5.2)
ALT: 9 U/L (ref 0–35)
ANION GAP: 14 (ref 5–15)
AST: 14 U/L (ref 0–37)
Alkaline Phosphatase: 78 U/L (ref 39–117)
BILIRUBIN TOTAL: 0.2 mg/dL — AB (ref 0.3–1.2)
BUN: 8 mg/dL (ref 6–23)
CHLORIDE: 102 meq/L (ref 96–112)
CO2: 21 mEq/L (ref 19–32)
Calcium: 8.8 mg/dL (ref 8.4–10.5)
Creatinine, Ser: 0.63 mg/dL (ref 0.50–1.10)
GFR calc Af Amer: >90 mL/min (ref >90)
GFR calc non Af Amer: >90 mL/min (ref >90)
Glucose, Bld: 94 mg/dL (ref 70–99)
POTASSIUM: 4.1 meq/L (ref 3.7–5.3)
SODIUM: 137 meq/L (ref 137–147)
Total Protein: 7 g/dL (ref 6.0–8.3)

## 2014-02-22 LAB — LIPASE, BLOOD: Lipase: 23 U/L (ref 11–59)

## 2014-02-22 LAB — URINE MICROSCOPIC-ADD ON

## 2014-02-22 LAB — POC URINE PREG, ED: Preg Test, Ur: NEGATIVE

## 2014-02-22 MED ORDER — ONDANSETRON 8 MG PO TBDP: 8.0000 mg | ORAL_TABLET | Freq: Three times a day (TID) | ORAL | Status: DC | PRN

## 2014-02-22 MED ORDER — MORPHINE SULFATE 2 MG/ML IJ SOLN
2.0000 mg | Freq: Once | INTRAMUSCULAR | Status: AC
  Administered 2014-02-22: 2 mg via INTRAVENOUS
  Filled 2014-02-22: qty 1

## 2014-02-22 MED ORDER — SODIUM CHLORIDE 0.9 % IV BOLUS (SEPSIS)
1000.0000 mL | Freq: Once | INTRAVENOUS | Status: AC
  Administered 2014-02-22: 1000 mL via INTRAVENOUS

## 2014-02-22 MED ORDER — LOPERAMIDE HCL 2 MG PO CAPS
2.0000 mg | ORAL_CAPSULE | Freq: Once | ORAL | Status: AC
  Administered 2014-02-22: 2 mg via ORAL
  Filled 2014-02-22: qty 1

## 2014-02-22 MED ORDER — ONDANSETRON 4 MG PO TBDP
8.0000 mg | ORAL_TABLET | Freq: Once | ORAL | Status: AC
  Administered 2014-02-22: 8 mg via ORAL
  Filled 2014-02-22: qty 2

## 2014-02-22 MED ORDER — ONDANSETRON HCL 4 MG/2ML IJ SOLN
4.0000 mg | Freq: Once | INTRAMUSCULAR | Status: AC
  Administered 2014-02-22: 4 mg via INTRAVENOUS
  Filled 2014-02-22: qty 2

## 2014-02-22 NOTE — ED Notes (Signed)
Pt presents with onset of epigastric pain, nausea and vomiting that began this morning.

## 2014-02-22 NOTE — ED Notes (Signed)
Ginger ale given

## 2014-02-22 NOTE — ED Provider Notes (Signed)
CSN: [id number]     Arrival date & time 02/22/14  1046 History   First MD Initiated Contact with Patient 02/22/14 1132     Chief Complaint - vomiting/diarrhea   Patient is a 23 y.o. female presenting with vomiting. The history is provided by the patient.  Emesis Severity:  Moderate Timing:  Intermittent Progression:  Worsening Chronicity:  New Relieved by:  Nothing Worsened by:  Nothing tried Associated symptoms: abdominal pain, diarrhea and myalgias   Associated symptoms: no fever   Patient reports having acute onset vomitng/diarrhea (nonbloody) that started this morning She also reports diffuse abdominal cramping She had otherwise been well last night No travel No sick contacts   Past Medical History  Diagnosis Date  . Asthma   . Anxiety   . Fracture, pelvis closed    Past Surgical History  Procedure Laterality Date  . No past surgeries     Family History  Problem Relation Age of Onset  . Cancer Mother     Living, brain tumor   History  Substance Use Topics  . Smoking status: Current Every Day Smoker -- 0.25 packs/day for 5 years    Types: Cigarettes  . Smokeless tobacco: Never Used  . Alcohol Use: No   OB History    Gravida Para Term Preterm AB TAB SAB Ectopic Multiple Living   3    2  2         Review of Systems  Constitutional: Positive for fatigue.  Respiratory: Positive for cough.   Cardiovascular: Negative for chest pain.  Gastrointestinal: Positive for vomiting, abdominal pain and diarrhea.  Musculoskeletal: Positive for myalgias.  All other systems reviewed and are negative.     Allergies  Review of patient's allergies indicates no known allergies.  Home Medications   Prior to Admission medications   Medication Sig Start Date End Date Taking? Authorizing Provider  albuterol (PROVENTIL HFA;VENTOLIN HFA) 108 (90 BASE) MCG/ACT inhaler Inhale 2 puffs into the lungs every 6 (six) hours as needed for wheezing.   Yes Historical Provider, MD   BP  123/76 mmHg  Pulse 65  Temp(Src) 97.6 F (36.4 C) (Oral)  Resp 22  Ht 5\' 3"  (1.6 m)  Wt 115 lb (52.164 kg)  BMI 20.38 kg/m2  SpO2 100%  LMP 02/22/2014 Physical Exam CONSTITUTIONAL: Well developed/well nourished HEAD: Normocephalic/atraumatic EYES: EOMI/PERRL ENMT: Mucous membranes moist NECK: supple no meningeal signs SPINE:entire spine nontender CV: S1/S2 noted, no murmurs/rubs/gallops noted LUNGS: Lungs are clear to auscultation bilaterally, no apparent distress ABDOMEN: soft, nontender, no rebound or guarding GU:no cva tenderness NEURO: Pt is awake/alert, moves all extremitiesx4 EXTREMITIES: pulses normal, full ROM SKIN: warm, color normal PSYCH: no abnormalities of mood noted  ED Course  Procedures  Labs Review Labs Reviewed  CBC WITH DIFFERENTIAL - Abnormal; Notable for the following:    Hemoglobin 10.9 (*)    HCT 34.0 (*)    MCV 77.1 (*)    MCH 24.7 (*)    All other components within normal limits  COMPREHENSIVE METABOLIC PANEL - Abnormal; Notable for the following:    Total Bilirubin 0.2 (*)    All other components within normal limits  URINALYSIS, ROUTINE W REFLEX MICROSCOPIC - Abnormal; Notable for the following:    APPearance CLOUDY (*)    Hgb urine dipstick MODERATE (*)    All other components within normal limits  URINE MICROSCOPIC-ADD ON - Abnormal; Notable for the following:    Squamous Epithelial / LPF FEW (*)    All  other components within normal limits  LIPASE, BLOOD  POC URINE PREG, ED    1:25 PM Pt improved No vomiting here Labs unremarkable We discussed strict return precautions Suspect viral gastroenteritis   MDM   Final diagnoses:  Abdominal pain, unspecified abdominal location  Vomiting and diarrhea    Nursing notes including past medical history and social history reviewed and considered in documentation Labs/vital reviewed and considered     Joya Gaskinsonald W Ginamarie Banfield, MD 02/22/14 1326

## 2014-02-27 ENCOUNTER — Emergency Department (HOSPITAL_COMMUNITY)
Admission: EM | Admit: 2014-02-27 | Discharge: 2014-02-28 | Discharge status: Against Medical Advice | Payer: Medicaid Other | Attending: Emergency Medicine | Admitting: Emergency Medicine

## 2014-02-27 ENCOUNTER — Encounter (HOSPITAL_COMMUNITY): Payer: Self-pay | Admitting: Emergency Medicine

## 2014-02-27 DIAGNOSIS — R112 Nausea with vomiting, unspecified: Secondary | ICD-10-CM | POA: Insufficient documentation

## 2014-02-27 DIAGNOSIS — R197 Diarrhea, unspecified: Secondary | ICD-10-CM | POA: Insufficient documentation

## 2014-02-27 DIAGNOSIS — J45909 Unspecified asthma, uncomplicated: Secondary | ICD-10-CM | POA: Insufficient documentation

## 2014-02-27 DIAGNOSIS — Z8659 Personal history of other mental and behavioral disorders: Secondary | ICD-10-CM | POA: Diagnosis not present

## 2014-02-27 DIAGNOSIS — R5383 Other fatigue: Secondary | ICD-10-CM | POA: Insufficient documentation

## 2014-02-27 DIAGNOSIS — Z79899 Other long term (current) drug therapy: Secondary | ICD-10-CM | POA: Diagnosis not present

## 2014-02-27 DIAGNOSIS — Z720 Tobacco use: Secondary | ICD-10-CM | POA: Diagnosis not present

## 2014-02-27 DIAGNOSIS — R1030 Lower abdominal pain, unspecified: Secondary | ICD-10-CM | POA: Diagnosis not present

## 2014-02-27 DIAGNOSIS — R109 Unspecified abdominal pain: Secondary | ICD-10-CM | POA: Diagnosis present

## 2014-02-27 LAB — I-STAT CHEM 8, ED
BUN: 7 mg/dL (ref 6–23)
CHLORIDE: 106 meq/L (ref 96–112)
Calcium, Ion: 1.13 mmol/L (ref 1.12–1.23)
Creatinine, Ser: 0.6 mg/dL (ref 0.50–1.10)
GLUCOSE: 102 mg/dL — AB (ref 70–99)
HEMATOCRIT: 39 % (ref 36.0–46.0)
Hemoglobin: 13.3 g/dL (ref 12.0–15.0)
POTASSIUM: 3.8 meq/L (ref 3.7–5.3)
Sodium: 139 mEq/L (ref 137–147)
TCO2: 21 mmol/L (ref 0–100)

## 2014-02-27 MED ORDER — IOHEXOL 300 MG/ML  SOLN
25.0000 mL | Freq: Once | INTRAMUSCULAR | Status: AC | PRN
  Administered 2014-02-27: 25 mL via ORAL

## 2014-02-27 MED ORDER — ONDANSETRON HCL 4 MG/2ML IJ SOLN
4.0000 mg | Freq: Once | INTRAMUSCULAR | Status: AC
  Administered 2014-02-27: 4 mg via INTRAVENOUS
  Filled 2014-02-27: qty 2

## 2014-02-27 MED ORDER — MORPHINE SULFATE 4 MG/ML IJ SOLN
4.0000 mg | Freq: Once | INTRAMUSCULAR | Status: AC
  Administered 2014-02-27: 4 mg via INTRAVENOUS
  Filled 2014-02-27: qty 1

## 2014-02-27 MED ORDER — SODIUM CHLORIDE 0.9 % IV BOLUS (SEPSIS)
1000.0000 mL | Freq: Once | INTRAVENOUS | Status: AC
  Administered 2014-02-27: 1000 mL via INTRAVENOUS

## 2014-02-27 NOTE — ED Notes (Signed)
The patient was just seen here for the same thing on the 12th of this month and she was discharged with nausea medicaton.  She says her pain has gotten worse and her diarrhea has gotten worse as well.  She advises she has had more than five bowel movements.

## 2014-02-27 NOTE — ED Notes (Addendum)
Pt refusing to have labs drawn, she states that she wants them to be drawn off IV.

## 2014-02-27 NOTE — ED Notes (Signed)
Carla RN unsuccessful IV attempt.

## 2014-02-27 NOTE — ED Provider Notes (Signed)
CSN: [id number]     Arrival date & time 02/27/14  1956 History   First MD Initiated Contact with Patient 02/27/14 2212     Chief Complaint  Patient presents with  . Abdominal Pain    The patient was just seen here for the same thing on the 12th of this month and she was discharged with nausea medicaton.     (Consider location/radiation/quality/duration/timing/severity/associated sxs/prior Treatment) Patient is a 23 y.o. female presenting with abdominal pain. The history is provided by the patient.  Abdominal Pain Associated symptoms: diarrhea, fatigue, nausea and vomiting   Associated symptoms: no chest pain and no shortness of breath   patient was seen in the ER 5 days ago for nausea vomiting diarrhea and abdominal pain. She was given anti-emetics and antidiarrhea pills and was discharged home. Patient is continued to have abdominal pain. She states she's gotten worse. She states it is somewhat severe in the mid abdomen. She has gone from diarrhea to constipation back to some diarrhea. She continues to have nausea and vomiting. No fevers. She states her chills of decreased somewhat. No dysuria. No vaginal bleeding or discharge.  Past Medical History  Diagnosis Date  . Asthma   . Anxiety   . Fracture, pelvis closed    Past Surgical History  Procedure Laterality Date  . No past surgeries     Family History  Problem Relation Age of Onset  . Cancer Mother     Living, brain tumor   History  Substance Use Topics  . Smoking status: Current Every Day Smoker -- 0.25 packs/day for 5 years    Types: Cigarettes  . Smokeless tobacco: Never Used  . Alcohol Use: No   OB History    Gravida Para Term Preterm AB TAB SAB Ectopic Multiple Living   3    2  2         Review of Systems  Constitutional: Positive for fatigue. Negative for activity change and appetite change.  Eyes: Negative for pain.  Respiratory: Negative for chest tightness and shortness of breath.   Cardiovascular:  Negative for chest pain and leg swelling.  Gastrointestinal: Positive for nausea, vomiting, abdominal pain and diarrhea.  Genitourinary: Negative for flank pain.  Musculoskeletal: Negative for back pain and neck stiffness.  Skin: Negative for rash.  Neurological: Negative for weakness, numbness and headaches.  Psychiatric/Behavioral: Negative for behavioral problems.      Allergies  Review of patient's allergies indicates no known allergies.  Home Medications   Prior to Admission medications   Medication Sig Start Date End Date Taking? Authorizing Provider  albuterol (PROVENTIL HFA;VENTOLIN HFA) 108 (90 BASE) MCG/ACT inhaler Inhale 2 puffs into the lungs every 6 (six) hours as needed for wheezing.   Yes Historical Provider, MD  ondansetron (ZOFRAN ODT) 8 MG disintegrating tablet Take 1 tablet (8 mg total) by mouth every 8 (eight) hours as needed. 8mg  ODT q4 hours prn nausea 02/22/14   Joya Gaskinsonald W Wickline, MD   BP 102/60 mmHg  Pulse 77  Temp(Src) 98.3 F (36.8 C) (Oral)  Resp 20  SpO2 100%  LMP 02/22/2014 Physical Exam  Constitutional: She is oriented to person, place, and time. She appears well-developed and well-nourished.  HENT:  Head: Normocephalic and atraumatic.  Eyes: EOM are normal. Pupils are equal, round, and reactive to light.  Neck: Normal range of motion. Neck supple.  Cardiovascular: Normal rate, regular rhythm and normal heart sounds.   No murmur heard. Pulmonary/Chest: Effort normal and breath sounds normal.  No respiratory distress. She has no wheezes. She has no rales.  Abdominal: Soft. She exhibits distension. There is tenderness. There is no rebound and no guarding.  Moderate lower abdominal tenderness without rebound or guarding. Mild diffuse distention.  Musculoskeletal: Normal range of motion.  Neurological: She is alert and oriented to person, place, and time. No cranial nerve deficit.  Skin: Skin is warm and dry.  Psychiatric: She has a normal mood and  affect. Her speech is normal.  Nursing note and vitals reviewed.   ED Course  Procedures (including critical care time) Labs Review Labs Reviewed  CBC WITH DIFFERENTIAL  I-STAT CHEM 8, ED    Imaging Review No results found.   EKG Interpretation None      MDM   Final diagnoses:  Lower abdominal pain    Patient with abdominal pain. Has had NVD. Labs reassuring. CT pending. No vaginal complaints    Juliet Rudeathan R. Rubin PayorPickering, MD 02/28/14 [phone]

## 2014-02-28 ENCOUNTER — Encounter (HOSPITAL_COMMUNITY): Payer: Self-pay

## 2014-02-28 ENCOUNTER — Emergency Department (HOSPITAL_COMMUNITY): Payer: Medicaid Other

## 2014-02-28 LAB — CBC WITH DIFFERENTIAL/PLATELET
Basophils Absolute: 0 10*3/uL (ref 0.0–0.1)
Basophils Relative: 0 % (ref 0–1)
EOS PCT: 1 % (ref 0–5)
Eosinophils Absolute: 0.1 10*3/uL (ref 0.0–0.7)
HEMATOCRIT: 32.8 % — AB (ref 36.0–46.0)
HEMOGLOBIN: 10.8 g/dL — AB (ref 12.0–15.0)
LYMPHS PCT: 29 % (ref 12–46)
Lymphs Abs: 3 10*3/uL (ref 0.7–4.0)
MCH: 24.2 pg — ABNORMAL LOW (ref 26.0–34.0)
MCHC: 32.9 g/dL (ref 30.0–36.0)
MCV: 73.5 fL — AB (ref 78.0–100.0)
Monocytes Absolute: 0.6 10*3/uL (ref 0.1–1.0)
Monocytes Relative: 6 % (ref 3–12)
Neutro Abs: 6.7 10*3/uL (ref 1.7–7.7)
Neutrophils Relative %: 64 % (ref 43–77)
Platelets: 321 10*3/uL (ref 150–400)
RBC: 4.46 MIL/uL (ref 3.87–5.11)
RDW: 14.8 % (ref 11.5–15.5)
WBC: 10.4 10*3/uL (ref 4.0–10.5)

## 2014-02-28 MED ORDER — MORPHINE SULFATE 4 MG/ML IJ SOLN
4.0000 mg | Freq: Once | INTRAMUSCULAR | Status: AC
  Administered 2014-02-28: 4 mg via INTRAVENOUS
  Filled 2014-02-28: qty 1

## 2014-02-28 MED ORDER — IOHEXOL 300 MG/ML  SOLN
100.0000 mL | Freq: Once | INTRAMUSCULAR | Status: AC | PRN
  Administered 2014-02-28: 100 mL via INTRAVENOUS

## 2014-02-28 NOTE — ED Notes (Signed)
Patient called and she stated she took IV out of arm before leaving.  Patient states that she will come back for results of CT.

## 2014-02-28 NOTE — ED Notes (Signed)
Pt is not in room. Pt, her friend and all belongings are not located in room. [facility].

## 2014-02-28 NOTE — ED Notes (Signed)
RN gone into room, patient no longer in ED.

## 2014-02-28 NOTE — ED Provider Notes (Signed)
Signed out by Dr. Pickering.  Pending CT scan.  CT scan negative for acute pathology. Placed order for patient to be by mouth challenge. At time of reassessment, patient had eloped from the ER without receiving scan results. Patient was never assessed by myself.  Results for orders placed or performed during the hospital encounter of 02/27/14  CBC with Differential  Result Value Ref Range   WBC 10.4 4.0 - 10.5 K/uL   RBC 4.46 3.87 - 5.11 MIL/uL   Hemoglobin 10.8 (L) 12.0 - 15.0 g/dL   HCT 32.8 (L) 36.0 - 46.0 %   MCV 73.5 (L) 78.0 - 100.0 fL   MCH 24.2 (L) 26.0 - 34.0 pg   MCHC 32.9 30.0 - 36.0 g/dL   RDW 14.8 11.5 - 15.5 %   Platelets 321 150 - 400 K/uL   Neutrophils Relative % 64 43 - 77 %   Lymphocytes Relative 29 12 - 46 %   Monocytes Relative 6 3 - 12 %   Eosinophils Relative 1 0 - 5 %   Basophils Relative 0 0 - 1 %   Neutro Abs 6.7 1.7 - 7.7 K/uL   Lymphs Abs 3.0 0.7 - 4.0 K/uL   Monocytes Absolute 0.6 0.1 - 1.0 K/uL   Eosinophils Absolute 0.1 0.0 - 0.7 K/uL   Basophils Absolute 0.0 0.0 - 0.1 K/uL   RBC Morphology TARGET CELLS   I-stat chem 8, ed  Result Value Ref Range   Sodium 139 137 - 147 mEq/L   Potassium 3.8 3.7 - 5.3 mEq/L   Chloride 106 96 - 112 mEq/L   BUN 7 6 - 23 mg/dL   Creatinine, Ser 0.60 0.50 - 1.10 mgRohr8Daphine Deutscher6Rex KrasBld Long8Daphine Deu KoreMarta 8.114SWFFayrene FeariJoKrKore ized lung bases are clear. No  pleural or pericardial effusion.  The liver demonstrates a normal contrast enhanced appearance. Gallbladder within normal limits. No biliary dilatation. Spleen, adrenal glands, and pancreas are within normal limits.  Kidneys are equal in size with symmetric enhancement. No nephrolithiasis, hydronephrosis, or focal enhancing renal mass.  Stomach is moderately distended but otherwise unremarkable. No evidence for bowel obstruction. No evidence for acute appendicitis. No abnormal wall thickening, mucosal enhancement, or inflammatory fat stranding seen about the bowels.  Bladder within normal limits. Uterus and left ovary are unremarkable. 2 cm cyst within the right ovary likely reflects a normal physiologic cyst.  Small volume free fluid present within the pelvic cul-de-sac, likely physiologic. No free intraperitoneal air. No adenopathy. Normal intravascular enhancement seen within the abdomen and pelvis.  No acute osseous abnormality. No worrisome lytic or blastic osseous lesions.  IMPRESSION: 1. No CT evidence for acute intra-abdominal or pelvic process. 2. 2 cm right ovarian cyst, likely a normal physiologic cyst.   Electronically Signed   By: Benjamin  McClintock M.D.   On: 02/28/2014 01:43  Shon Batonourtney F Kimble Hitchens, MD 02/28/14 [phone]

## 2014-08-17 ENCOUNTER — Encounter (HOSPITAL_COMMUNITY): Payer: Self-pay | Admitting: Emergency Medicine

## 2014-08-17 ENCOUNTER — Emergency Department (HOSPITAL_COMMUNITY)
Admission: EM | Admit: 2014-08-17 | Discharge: 2014-08-17 | Disposition: A | Discharge status: Home / Self Care | Payer: Medicaid Other | Attending: Emergency Medicine | Admitting: Emergency Medicine

## 2014-08-17 DIAGNOSIS — Z79899 Other long term (current) drug therapy: Secondary | ICD-10-CM | POA: Insufficient documentation

## 2014-08-17 DIAGNOSIS — J45909 Unspecified asthma, uncomplicated: Secondary | ICD-10-CM | POA: Insufficient documentation

## 2014-08-17 DIAGNOSIS — Z720 Tobacco use: Secondary | ICD-10-CM | POA: Insufficient documentation

## 2014-08-17 DIAGNOSIS — Z8659 Personal history of other mental and behavioral disorders: Secondary | ICD-10-CM | POA: Insufficient documentation

## 2014-08-17 DIAGNOSIS — L03116 Cellulitis of left lower limb: Secondary | ICD-10-CM

## 2014-08-17 DIAGNOSIS — Z792 Long term (current) use of antibiotics: Secondary | ICD-10-CM | POA: Insufficient documentation

## 2014-08-17 DIAGNOSIS — Z8781 Personal history of (healed) traumatic fracture: Secondary | ICD-10-CM | POA: Insufficient documentation

## 2014-08-17 MED ORDER — CEPHALEXIN 500 MG PO CAPS: 500.0000 mg | ORAL_CAPSULE | Freq: Four times a day (QID) | ORAL | Status: DC

## 2014-08-17 MED ORDER — BUPIVACAINE-EPINEPHRINE (PF) 0.5% -1:200000 IJ SOLN
10.0000 mL | Freq: Once | INTRAMUSCULAR | Status: AC
  Administered 2014-08-17: 10 mL
  Filled 2014-08-17: qty 30

## 2014-08-17 MED ORDER — OXYCODONE-ACETAMINOPHEN 5-325 MG PO TABS
2.0000 | ORAL_TABLET | Freq: Once | ORAL | Status: AC
  Administered 2014-08-17: 2 via ORAL
  Filled 2014-08-17: qty 2

## 2014-08-17 NOTE — Discharge Instructions (Signed)
Cellulitis Apply warm compresses several times a day.  Return for worsening symptoms such as fever or increased swelling.  Cellulitis is an infection of the skin and the tissue beneath it. The infected area is usually red and tender. Cellulitis occurs most often in the arms and lower legs.  CAUSES  Cellulitis is caused by bacteria that enter the skin through cracks or cuts in the skin. The most common types of bacteria that cause cellulitis are staphylococci and streptococci. SIGNS AND SYMPTOMS   Redness and warmth.  Swelling.  Tenderness or pain.  Fever. DIAGNOSIS  Your health care provider can usually determine what is wrong based on a physical exam. Blood tests may also be done. TREATMENT  Treatment usually involves taking an antibiotic medicine. HOME CARE INSTRUCTIONS   Take your antibiotic medicine as directed by your health care provider. Finish the antibiotic even if you start to feel better.  Keep the infected arm or leg elevated to reduce swelling.  Apply a warm cloth to the affected area up to 4 times per day to relieve pain.  Take medicines only as directed by your health care provider.  Keep all follow-up visits as directed by your health care provider. SEEK MEDICAL CARE IF:   You notice red streaks coming from the infected area.  Your red area gets larger or turns dark in color.  Your bone or joint underneath the infected area becomes painful after the skin has healed.  Your infection returns in the same area or another area.  You notice a swollen bump in the infected area.  You develop new symptoms.  You have a fever. SEEK IMMEDIATE MEDICAL CARE IF:   You feel very sleepy.  You develop vomiting or diarrhea.  You have a general ill feeling (malaise) with muscle aches and pains. MAKE SURE YOU:   Understand these instructions.  Will watch your condition.  Will get help right away if you are not doing well or get worse. Document Released: 01/07/2005  Document Revised: 08/14/2013 Document Reviewed: 06/15/2011 [facility] Patient Information 2015 [employer] [place]. This information is not intended to replace advice given to you by your health care provider. Make sure you discuss any questions you have with your health care provider.

## 2014-08-17 NOTE — ED Notes (Addendum)
Pt reports abscess between leg and vaginal area for past week. Also reports clear/white vaginal discharge that is more than usual.

## 2014-08-17 NOTE — ED Provider Notes (Signed)
CSN: [id number]     Arrival date & time 08/17/14  1527 History   First MD Initiated Contact with Patient 08/17/14 1707     Chief Complaint  Patient presents with  . Abscess  . Vaginal Discharge     (Consider location/radiation/quality/duration/timing/severity/associated sxs/prior Treatment) Patient is a 24 y.o. female presenting with abscess and vaginal discharge. The history is provided by the patient. No language interpreter was used.  Abscess Associated symptoms: no fever, no nausea and no vomiting   Vaginal Discharge Associated symptoms: no dysuria, no fever, no nausea and no vomiting   Victoria Cline is a 24 y.o female with a history of asthma and anxiety who presents with new onset left leg and groin abscess.  She states it has been worsening over the last 3 days. No drainage or streaking. She had no prior treatment. She has never had this in the past. Her LMP was at the beginning of last month. She denies any fever, nausea, vomiting, difficulty with bowel movements, dysuria, difficulty urinating, rectal pain, vaginal bleeding or discharge.   Past Medical History  Diagnosis Date  . Asthma   . Anxiety   . Fracture, pelvis closed    Past Surgical History  Procedure Laterality Date  . No past surgeries     Family History  Problem Relation Age of Onset  . Cancer Mother     Living, brain tumor   History  Substance Use Topics  . Smoking status: Current Every Day Smoker -- 0.25 packs/day for 5 years    Types: Cigarettes  . Smokeless tobacco: Never Used  . Alcohol Use: No   OB History    Gravida Para Term Preterm AB TAB SAB Ectopic Multiple Living   3    2  2         Review of Systems  Constitutional: Negative for fever and chills.  Gastrointestinal: Negative for nausea and vomiting.  Genitourinary: Negative for dysuria, urgency, hematuria, vaginal bleeding, vaginal discharge and difficulty urinating.  Musculoskeletal: Negative for gait problem.      Allergies  Review  of patient's allergies indicates no known allergies.  Home Medications   Prior to Admission medications   Medication Sig Start Date End Date Taking? Authorizing Provider  albuterol (PROVENTIL HFA;VENTOLIN HFA) 108 (90 BASE) MCG/ACT inhaler Inhale 2 puffs into the lungs every 6 (six) hours as needed for wheezing.   Yes Historical Provider, MD  cephALEXin (KEFLEX) 500 MG capsule Take 1 capsule (500 mg total) by mouth 4 (four) times daily. 08/17/14   Travers Goodley Patel-Mills, PA-C  ondansetron (ZOFRAN ODT) 8 MG disintegrating tablet Take 1 tablet (8 mg total) by mouth every 8 (eight) hours as needed. 8mg  ODT q4 hours prn nausea Patient not taking: Reported on 08/17/2014 02/22/14   Zadie Rhineonald Wickline, MD   BP 118/65 mmHg  Pulse 62  Temp(Src) 98.5 F (36.9 C) (Oral)  Resp 16  SpO2 100% Physical Exam  Constitutional: She is oriented to person, place, and time. She appears well-developed and well-nourished.  Cardiovascular: Normal rate, regular rhythm and normal heart sounds.   Pulmonary/Chest: Effort normal and breath sounds normal.  Abdominal: Soft. There is no tenderness.  Genitourinary:  Rectal exam: No tenderness or fluctuance.   Musculoskeletal: Normal range of motion.  Neurological: She is alert and oriented to person, place, and time.  Skin: Skin is warm and dry.  Left proximal medial thigh abscess with tenderness to palpation and warmth along the medial thigh and left groin.  No fluctuance.  ED Course  Procedures (including critical care time) Labs Review Labs Reviewed - No data to display  Imaging Review No results found.   EKG Interpretation None      MDM   Final diagnoses:  Cellulitis of left thigh   Patient presents for abscess on the left medial thigh.  I viewed the abscess with the ultrasound and could not see a pocket of fluid or cobblestoning.  The patient is tender along the left groin and thigh to the posterior buttox. She states she shaves the are and puts nair  on the area on occasion.  It does not involve the labia. I was able to inject the area with marcaine .5% with epi to provide the patient comfort due to pain.  There was no drainage during the injection and the patient tolerated the procedure well. A bandaid was placed over the injection site.   The patient is currently afebrile and non toxic appearing. I put the patient on keflex with strict return precautions.  She is to apply warm compresses. I have given her the resource guide to follow up.    Victoria GosselinHanna Patel-Mills, PA-C 08/18/14 [id number]  Bethann BerkshireJoseph Zammit, MD 08/18/14 [phone]

## 2014-09-23 ENCOUNTER — Emergency Department (HOSPITAL_COMMUNITY)
Admission: EM | Admit: 2014-09-23 | Discharge: 2014-09-23 | Disposition: A | Discharge status: Home / Self Care | Payer: Medicaid Other | Attending: Emergency Medicine | Admitting: Emergency Medicine

## 2014-09-23 ENCOUNTER — Emergency Department (HOSPITAL_COMMUNITY): Payer: Medicaid Other

## 2014-09-23 ENCOUNTER — Encounter (HOSPITAL_COMMUNITY): Payer: Self-pay | Admitting: *Deleted

## 2014-09-23 DIAGNOSIS — S60212A Contusion of left wrist, initial encounter: Secondary | ICD-10-CM | POA: Insufficient documentation

## 2014-09-23 DIAGNOSIS — Z792 Long term (current) use of antibiotics: Secondary | ICD-10-CM | POA: Insufficient documentation

## 2014-09-23 DIAGNOSIS — Z720 Tobacco use: Secondary | ICD-10-CM | POA: Insufficient documentation

## 2014-09-23 DIAGNOSIS — J45909 Unspecified asthma, uncomplicated: Secondary | ICD-10-CM | POA: Insufficient documentation

## 2014-09-23 DIAGNOSIS — Z3202 Encounter for pregnancy test, result negative: Secondary | ICD-10-CM | POA: Insufficient documentation

## 2014-09-23 DIAGNOSIS — Y9389 Activity, other specified: Secondary | ICD-10-CM | POA: Insufficient documentation

## 2014-09-23 DIAGNOSIS — W231XXA Caught, crushed, jammed, or pinched between stationary objects, initial encounter: Secondary | ICD-10-CM | POA: Insufficient documentation

## 2014-09-23 DIAGNOSIS — Y9289 Other specified places as the place of occurrence of the external cause: Secondary | ICD-10-CM | POA: Insufficient documentation

## 2014-09-23 DIAGNOSIS — Y998 Other external cause status: Secondary | ICD-10-CM | POA: Insufficient documentation

## 2014-09-23 DIAGNOSIS — Z79899 Other long term (current) drug therapy: Secondary | ICD-10-CM | POA: Insufficient documentation

## 2014-09-23 LAB — PREGNANCY, URINE: Preg Test, Ur: NEGATIVE

## 2014-09-23 MED ORDER — HYDROCODONE-ACETAMINOPHEN 5-325 MG PO TABS
1.0000 | ORAL_TABLET | Freq: Once | ORAL | Status: AC
  Administered 2014-09-23: 1 via ORAL
  Filled 2014-09-23: qty 1

## 2014-09-23 MED ORDER — NAPROXEN 375 MG PO TABS: 375.0000 mg | ORAL_TABLET | Freq: Two times a day (BID) | ORAL | Status: DC

## 2014-09-23 NOTE — ED Provider Notes (Signed)
CSN: [id number]     Arrival date & time 09/23/14  1933 History   First MD Initiated Contact with Patient 09/23/14 2039     Chief Complaint  Patient presents with  . Wrist Injury     (Consider location/radiation/quality/duration/timing/severity/associated sxs/prior Treatment) Patient is a 24 y.o. female presenting with wrist injury. The history is provided by the patient.  Wrist Injury Location:  Wrist Time since incident:  1 day Injury: yes   Mechanism of injury: crush   Crush injury:    Mechanism:  Door Wrist location:  L wrist Pain details:    Quality:  Aching  Victoria Cline is a 24 y.o. female who presents to the ED with left wrist pain. She states that she closed her wrist in a car door last night. She complains of pain over the radial aspect of the wrist that radiates to the forearm. She denies any other injuries.   Past Medical History  Diagnosis Date  . Asthma   . Anxiety   . Fracture, pelvis closed    Past Surgical History  Procedure Laterality Date  . No past surgeries     Family History  Problem Relation Age of Onset  . Cancer Mother     Living, brain tumor   History  Substance Use Topics  . Smoking status: Current Every Day Smoker -- 0.25 packs/day for 5 years    Types: Cigarettes  . Smokeless tobacco: Never Used  . Alcohol Use: No   OB History    Gravida Para Term Preterm AB TAB SAB Ectopic Multiple Living   Review of Systems Negative except as stated in HPI   Allergies  Review of patient's allergies indicates no known allergies.  Home Medications   Prior to Admission medications   Medication Sig Start Date End Date Taking? Authorizing Provider  albuterol (PROVENTIL HFA;VENTOLIN HFA) 108 (90 BASE) MCG/ACT inhaler Inhale 2 puffs into the lungs every 6 (six) hours as needed for wheezing.    Historical Provider, MD  cephALEXin (KEFLEX) 500 MG capsule Take 1 capsule (500 mg total) by mouth 4 (four) times daily. 08/17/14    Hanna Patel-Mills, PA-C  naproxen (NAPROSYN) 375 MG tablet Take 1 tablet (375 mg total) by mouth 2 (two) times daily. 09/23/14   Hope Orlene Och, NP  ondansetron (ZOFRAN ODT) 8 MG disintegrating tablet Take 1 tablet (8 mg total) by mouth every 8 (eight) hours as needed.  ODT q4 hours prn nausea Patient not taking: Reported on 08/17/2014 02/22/14   Zadie Rhine, MD   BP 120/68 mmHg  Pulse 72  Temp(Src) 98.9 F (37.2 C) (Oral)  Resp 20  Ht  (1.6 m)  Wt 108 lb 4.8 oz (49.125 kg)  BMI 19.19 kg/m2  SpO2 100%  LMP 08/05/2014 Physical Exam  Constitutional: She is oriented to person, place, and time. She appears well-developed and well-nourished. No distress.  HENT:  Head: Normocephalic.  Eyes: EOM are normal.  Neck: Neck supple.  Cardiovascular: Normal rate.   Pulmonary/Chest: Effort normal.  Musculoskeletal:       Left wrist: She exhibits tenderness. She exhibits no swelling, no deformity and no laceration. Decreased range of motion: due to pain.       Arms: Radial pulses 2+ bilateral, adequate circulation, good touch sensation.   Neurological: She is alert and oriented to person, place, and time. No cranial nerve deficit.  Skin: Skin is warm and  dry.  Psychiatric: She has a normal mood and affect. Her behavior is normal.  Nursing note and vitals reviewed.   ED Course  Procedures (including critical care time) Labs Review Dg Wrist Complete Left  09/23/2014   CLINICAL DATA:  Initial evaluation for acute trauma.  EXAM: LEFT WRIST - COMPLETE 3+ VIEW  COMPARISON:  None.  FINDINGS: There is no evidence of fracture or dislocation. There is no evidence of arthropathy or other focal bone abnormality. Soft tissues are unremarkable.  IMPRESSION: Negative.   Electronically Signed   By: Rise Mu M.D.   On: 09/23/2014 21:22     Imaging Review Dg Wrist Complete Left  09/23/2014   CLINICAL DATA:  Initial evaluation for acute trauma.  EXAM: LEFT WRIST - COMPLETE 3+ VIEW   COMPARISON:  None.  FINDINGS: There is no evidence of fracture or dislocation. There is no evidence of arthropathy or other focal bone abnormality. Soft tissues are unremarkable.  IMPRESSION: Negative.   Electronically Signed   By: Rise Mu M.D.   On: 09/23/2014 21:22     MDM  24 y.o. female with pain to the left wrist s/p injury last night. Stable for d/c without neurovascular compromise. Placed in wrist splint, ice, elevation and pain management. She will follow up with ortho if symptoms persist.   Final diagnoses:  Contusion of left wrist, initial encounter     Janne Napoleon, NP 09/23/14 2203  Raeford Razor, MD 09/24/14 1537

## 2014-09-23 NOTE — ED Notes (Addendum)
Pt states she closed her left wrist in a car door last night. Pt states she also may be pregnant.

## 2014-09-23 NOTE — Discharge Instructions (Signed)
Your pregnancy test today is negative. Wear the wrist splint for comfort, take the medication for pain. Follow up with Dr. Romeo Apple if symptoms persist.

## 2015-01-21 ENCOUNTER — Encounter (HOSPITAL_COMMUNITY): Payer: Self-pay | Admitting: *Deleted

## 2015-01-21 ENCOUNTER — Emergency Department (HOSPITAL_COMMUNITY)
Admission: EM | Admit: 2015-01-21 | Discharge: 2015-01-21 | Discharge status: Against Medical Advice | Payer: Medicaid Other | Attending: Emergency Medicine | Admitting: Emergency Medicine

## 2015-01-21 ENCOUNTER — Emergency Department (HOSPITAL_COMMUNITY)
Admission: EM | Admit: 2015-01-21 | Discharge: 2015-01-21 | Disposition: A | Discharge status: Home / Self Care | Payer: Self-pay | Attending: Emergency Medicine | Admitting: Emergency Medicine

## 2015-01-21 ENCOUNTER — Emergency Department (HOSPITAL_COMMUNITY): Payer: Medicaid Other

## 2015-01-21 DIAGNOSIS — J45901 Unspecified asthma with (acute) exacerbation: Secondary | ICD-10-CM | POA: Insufficient documentation

## 2015-01-21 DIAGNOSIS — Z8781 Personal history of (healed) traumatic fracture: Secondary | ICD-10-CM | POA: Insufficient documentation

## 2015-01-21 DIAGNOSIS — Z79899 Other long term (current) drug therapy: Secondary | ICD-10-CM | POA: Insufficient documentation

## 2015-01-21 DIAGNOSIS — Z8659 Personal history of other mental and behavioral disorders: Secondary | ICD-10-CM | POA: Insufficient documentation

## 2015-01-21 DIAGNOSIS — Z720 Tobacco use: Secondary | ICD-10-CM | POA: Insufficient documentation

## 2015-01-21 DIAGNOSIS — R0602 Shortness of breath: Secondary | ICD-10-CM

## 2015-01-21 MED ORDER — PREDNISONE 20 MG PO TABS
60.0000 mg | ORAL_TABLET | Freq: Once | ORAL | Status: AC
  Administered 2015-01-21: 60 mg via ORAL
  Filled 2015-01-21: qty 3

## 2015-01-21 MED ORDER — IBUPROFEN 400 MG PO TABS
600.0000 mg | ORAL_TABLET | Freq: Once | ORAL | Status: AC
  Administered 2015-01-21: 600 mg via ORAL
  Filled 2015-01-21 (×2): qty 1

## 2015-01-21 MED ORDER — ALBUTEROL SULFATE HFA 108 (90 BASE) MCG/ACT IN AERS
2.0000 | INHALATION_SPRAY | RESPIRATORY_TRACT | Status: DC | PRN
  Administered 2015-01-21: 2 via RESPIRATORY_TRACT
  Filled 2015-01-21: qty 6.7

## 2015-01-21 MED ORDER — PREDNISONE 20 MG PO TABS: 40.0000 mg | ORAL_TABLET | Freq: Every day | ORAL | Status: DC

## 2015-01-21 MED ORDER — ALBUTEROL SULFATE (2.5 MG/3ML) 0.083% IN NEBU
5.0000 mg | INHALATION_SOLUTION | Freq: Once | RESPIRATORY_TRACT | Status: AC
  Administered 2015-01-21: 5 mg via RESPIRATORY_TRACT
  Filled 2015-01-21: qty 6

## 2015-01-21 NOTE — ED Notes (Signed)
After multiple attempts to get pt to get of the phone with her mother,. Pt still refuses and is uncooperative about answering questions

## 2015-01-21 NOTE — ED Provider Notes (Signed)
I went in to see patient initially at 4:37 AM. She was on the phone and did not want to turn off the phone. She states she was talking to her mother. I heard her talking on the phone when I was in the hall entering the room.  I informed her that I could not do her interview with the phone on because I did not know if that was her mother on the phone or if she was recording our interview. She left the room all the while yelling at me and nursing staff with loud voice.  The nurses and a Environmental manager who is in the ED talked to the patient. She then stated she would stay and turn off the phone. I went in to see her again at 56, at this point she told me to "look it up" when I asked her if she had asthma and told me to "read the notes" of the nurse. She refused to answer any questions. She again walked out the room despite being told if she would stay she would be treated. She met her friend in the hall. I asked him to talk to her to convince her to stay. She was talking loudly and yelling about the staff and myself.   On exam patient was talking on the phone with no problems. As she got upset she had "wheezing" which sounded like it was coming from her throat. She said "my oxygen is dropping", the nurse informed her her oxygen was 100 %. No other physical exam was attempted.   Pt left AMA.   Rolland Porter, MD, Barbette Or, MD 01/21/15 [phone]

## 2015-01-21 NOTE — ED Notes (Signed)
The pt is c/o  A cold cough and having diff breathing for 5-10 days.  She was speaking ok when she first came into the room.  Ten minutesl later she was making noises when she breathed  ?? Deliberate sats 100% on room air  lmp oct 4th

## 2015-01-21 NOTE — Discharge Instructions (Signed)
Asthma, Adult Asthma is a condition of the lungs in which the airways tighten and narrow. Asthma can make it hard to breathe. Asthma cannot be cured, but medicine and lifestyle changes can help control it. Asthma may be started (triggered) by:  Animal skin flakes (dander).  Dust.  Cockroaches.  Pollen.  Mold.  Smoke.  Cleaning products.  Hair sprays or aerosol sprays.  Paint fumes or strong smells.  Cold air, weather changes, and winds.  Crying or laughing hard.  Stress.  Certain medicines or drugs.  Foods, such as dried fruit, potato chips, and sparkling grape juice.  Infections or conditions (colds, flu).  Exercise.  Certain medical conditions or diseases.  Exercise or tiring activities. HOME CARE   Take medicine as told by your doctor.  Use a peak flow meter as told by your doctor. A peak flow meter is a tool that measures how well the lungs are working.  Record and keep track of the peak flow meter's readings.  Understand and use the asthma action plan. An asthma action plan is a written plan for taking care of your asthma and treating your attacks.  To help prevent asthma attacks:  Do not smoke. Stay away from secondhand smoke.  Change your heating and air conditioning filter often.  Limit your use of fireplaces and wood stoves.  Get rid of pests (such as roaches and mice) and their droppings.  Throw away plants if you see mold on them.  Clean your floors. Dust regularly. Use cleaning products that do not smell.  Have someone vacuum when you are not home. Use a vacuum cleaner with a HEPA filter if possible.  Replace carpet with wood, tile, or vinyl flooring. Carpet can trap animal skin flakes and dust.  Use allergy-proof pillows, mattress covers, and box spring covers.  Wash bed sheets and blankets every week in hot water and dry them in a dryer.  Use blankets that are made of polyester or cotton.  Clean bathrooms and kitchens with bleach.  If possible, have someone repaint the walls in these rooms with mold-resistant paint. Keep out of the rooms that are being cleaned and painted.  Wash hands often. GET HELP IF:  You have make a whistling sound when breaking (wheeze), have shortness of breath, or have a cough even if taking medicine to prevent attacks.  The colored mucus you cough up (sputum) is thicker than usual.  The colored mucus you cough up changes from clear or white to yellow, green, gray, or bloody.  You have problems from the medicine you are taking such as:  A rash.  Itching.  Swelling.  Trouble breathing.  You need reliever medicines more than 2-3 times a week.  Your peak flow measurement is still at 50-79% of your personal best after following the action plan for 1 hour.  You have a fever. GET HELP RIGHT AWAY IF:   You seem to be worse and are not responding to medicine during an asthma attack.  You are short of breath even at rest.  You get short of breath when doing very little activity.  You have trouble eating, drinking, or talking.  You have chest pain.  You have a fast heartbeat.  Your lips or fingernails start to turn blue.  You are light-headed, dizzy, or faint.  Your peak flow is less than 50% of your personal best.   This information is not intended to replace advice given to you by your health care provider. Make sure   you discuss any questions you have with your health care provider.   Document Released: 09/16/2007 Document Revised: 12/19/2014 Document Reviewed: 10/27/2012 Elsevier Interactive Patient Education 2016 [employer]  

## 2015-01-21 NOTE — ED Notes (Signed)
Pt  Has been to xray and returned.  She is requesting benadryl..  Was told  Not recommended for asthmatics

## 2015-01-21 NOTE — ED Provider Notes (Signed)
CSN: [id number]     Arrival date & time 01/21/15  0529 History   First MD Initiated Contact with Patient 01/21/15 0602     Chief Complaint  Patient presents with  . Croup     (Consider location/radiation/quality/duration/timing/severity/associated sxs/prior Treatment) HPI Comments: Pt comes in with c/o trouble breathing. She states that it has been over the last week. No fever. She has had a cough. She hasn't used and inhaler. She states that she doesn't use inhalers very often so she doesn't have one at this time. She is a smoker. She is not sure when she had prednisone last. She has not had to be intubated.she states that she also had a headache at this time and would like some pain medication  The history is provided by the patient. No language interpreter was used.    Past Medical History  Diagnosis Date  . Asthma   . Anxiety   . Fracture, pelvis closed [facility])    Past Surgical History  Procedure Laterality Date  . No past surgeries     Family History  Problem Relation Age of Onset  . Cancer Mother     Living, brain tumor   Social History  Substance Use Topics  . Smoking status: Current Every Day Smoker -- 0.25 packs/day for 5 years    Types: Cigarettes  . Smokeless tobacco: Never Used  . Alcohol Use: No   OB History    Gravida Para Term Preterm AB TAB SAB Ectopic Multiple Living   Review of Systems  All other systems reviewed and are negative.     Allergies  Review of patient's allergies indicates no known allergies.  Home Medications   Prior to Admission medications   Medication Sig Start Date End Date Taking? Authorizing Provider  albuterol (PROVENTIL HFA;VENTOLIN HFA) 108 (90 BASE) MCG/ACT inhaler Inhale 2 puffs into the lungs every 6 (six) hours as needed for wheezing.    Historical Provider, MD  cephALEXin (KEFLEX) 500 MG capsule Take 1 capsule (500 mg total) by mouth 4 (four) times daily. 08/17/14   Hanna Patel-Mills, PA-C  naproxen  (NAPROSYN) 375 MG tablet Take 1 tablet (375 mg total) by mouth 2 (two) times daily. 09/23/14   Hope Orlene Och, NP  ondansetron (ZOFRAN ODT) 8 MG disintegrating tablet Take 1 tablet (8 mg total) by mouth every 8 (eight) hours as needed.  ODT q4 hours prn nausea Patient not taking: Reported on 08/17/2014 02/22/14   Zadie Rhine, MD   BP 113/59 mmHg  Pulse 109  Temp(Src) 98.2 F (36.8 C) (Oral)  Resp 24  Ht  (1.6 m)  Wt 115 lb (52.164 kg)  BMI 20.38 kg/m2  SpO2 100%  LMP 01/15/2015 Physical Exam  Constitutional: She is oriented to person, place, and time. She appears well-developed and well-nourished.  HENT:  Head: Normocephalic and atraumatic.  Right Ear: External ear normal.  Left Ear: External ear normal.  Eyes: EOM are normal. Pupils are equal, round, and reactive to light.  Cardiovascular: Normal rate and regular rhythm.   Pulmonary/Chest: No respiratory distress. She has wheezes.  Musculoskeletal: Normal range of motion.  Neurological: She is alert and oriented to person, place, and time.  Skin: Skin is warm.  Psychiatric: She has a normal mood and affect.  Nursing note and vitals reviewed.   ED Course  Procedures (including critical care time) Labs Review Labs Reviewed - No data to  display  Imaging Review Dg Chest 2 View  01/21/2015   CLINICAL DATA:  Wheezing and cough for 2 days.  History of asthma.  EXAM: CHEST  2 VIEW  COMPARISON:  12/21/2013  FINDINGS: Pulmonary hyperinflation with mild bronchial wall thickening consistent with history of asthma. No focal airspace disease or consolidation in the lungs. No blunting of costophrenic angles. No pneumothorax. Normal heart size and pulmonary vascularity. Mediastinal contours appear intact.  IMPRESSION: Hyperinflation and peribronchial thickening consistent with history of asthma. No focal consolidation.   Electronically Signed   By: Burman Nieves M.D.   On: 01/21/2015 06:52   I have personally reviewed and evaluated  these images and lab results as part of my medical decision-making.   EKG Interpretation None      MDM   Final diagnoses:  Asthma exacerbation    Pt doing better after treatment. No infection noted on x-ray. Pt given inhaler here and will send home with prednisone   Teressa Lower, NP 01/21/15 1610  Dione Booze, MD 01/22/15 [phone]

## 2015-01-21 NOTE — ED Notes (Signed)
Pt refusing to get off phone with mother and not answering questions.  Officer Zach Fulp came to hallway out side of room to calm pt down.  Pt got off phone but refuses to answer any of Dr. Madaline Savage questions.  Pt states "look it up" when asked about her medical history.  Pt states that she cannot talk because she is losing her breath.  O2 saturation at 100% during time Dr. Lynelle Doctor attempting to assess the pt.

## 2015-01-21 NOTE — ED Notes (Signed)
Pt c/o sob x 1 day; pt is uncooperative during assessment and refused to get off her cell phone intially; Pt is c/o chest pain

## 2016-05-13 ENCOUNTER — Encounter (HOSPITAL_COMMUNITY): Payer: Self-pay | Admitting: Emergency Medicine

## 2016-05-13 ENCOUNTER — Emergency Department (HOSPITAL_COMMUNITY)
Admission: EM | Admit: 2016-05-13 | Discharge: 2016-05-13 | Disposition: A | Discharge status: Home / Self Care | Payer: Medicaid Other | Attending: Emergency Medicine | Admitting: Emergency Medicine

## 2016-05-13 ENCOUNTER — Emergency Department (HOSPITAL_COMMUNITY): Payer: Medicaid Other

## 2016-05-13 DIAGNOSIS — Y999 Unspecified external cause status: Secondary | ICD-10-CM | POA: Insufficient documentation

## 2016-05-13 DIAGNOSIS — S63501A Unspecified sprain of right wrist, initial encounter: Secondary | ICD-10-CM

## 2016-05-13 DIAGNOSIS — W108XXA Fall (on) (from) other stairs and steps, initial encounter: Secondary | ICD-10-CM | POA: Insufficient documentation

## 2016-05-13 DIAGNOSIS — Y9301 Activity, walking, marching and hiking: Secondary | ICD-10-CM | POA: Insufficient documentation

## 2016-05-13 DIAGNOSIS — Z79899 Other long term (current) drug therapy: Secondary | ICD-10-CM | POA: Insufficient documentation

## 2016-05-13 DIAGNOSIS — Y929 Unspecified place or not applicable: Secondary | ICD-10-CM | POA: Insufficient documentation

## 2016-05-13 DIAGNOSIS — F17210 Nicotine dependence, cigarettes, uncomplicated: Secondary | ICD-10-CM | POA: Insufficient documentation

## 2016-05-13 DIAGNOSIS — J45909 Unspecified asthma, uncomplicated: Secondary | ICD-10-CM | POA: Insufficient documentation

## 2016-05-13 MED ORDER — CYCLOBENZAPRINE HCL 10 MG PO TABS: 10.0000 mg | ORAL_TABLET | Freq: Two times a day (BID) | ORAL | 0 refills | Status: DC | PRN

## 2016-05-13 MED ORDER — IBUPROFEN 800 MG PO TABS
800.0000 mg | ORAL_TABLET | Freq: Once | ORAL | Status: AC
  Administered 2016-05-13: 800 mg via ORAL
  Filled 2016-05-13: qty 1

## 2016-05-13 MED ORDER — IBUPROFEN 800 MG PO TABS: 800.0000 mg | ORAL_TABLET | Freq: Three times a day (TID) | ORAL | 0 refills | Status: DC

## 2016-05-13 NOTE — ED Notes (Addendum)
Pt ambulated to XR slowly but without difficulty.

## 2016-05-13 NOTE — ED Triage Notes (Signed)
Pt comes with complaints of right hand pain after a fall down the stairs about 30 minutes ago.  Pt reports she cannot move that hand. Pt reports she hit her head too.  Denies use of blood thinners. Ambulatory in triage.
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
## 2016-07-31 ENCOUNTER — Encounter (HOSPITAL_COMMUNITY): Payer: Self-pay

## 2016-07-31 DIAGNOSIS — R55 Syncope and collapse: Secondary | ICD-10-CM | POA: Insufficient documentation

## 2016-07-31 DIAGNOSIS — N390 Urinary tract infection, site not specified: Secondary | ICD-10-CM | POA: Insufficient documentation

## 2016-07-31 DIAGNOSIS — J45909 Unspecified asthma, uncomplicated: Secondary | ICD-10-CM | POA: Insufficient documentation

## 2016-07-31 DIAGNOSIS — F17210 Nicotine dependence, cigarettes, uncomplicated: Secondary | ICD-10-CM | POA: Insufficient documentation

## 2016-07-31 LAB — CBC
HEMATOCRIT: 27.3 % — AB (ref 36.0–46.0)
HEMOGLOBIN: 8.8 g/dL — AB (ref 12.0–15.0)
MCH: 21.6 pg — ABNORMAL LOW (ref 26.0–34.0)
MCHC: 32.2 g/dL (ref 30.0–36.0)
MCV: 67.1 fL — ABNORMAL LOW (ref 78.0–100.0)
Platelets: 363 10*3/uL (ref 150–400)
RBC: 4.07 MIL/uL (ref 3.87–5.11)
RDW: 18 % — AB (ref 11.5–15.5)
WBC: 7.3 10*3/uL (ref 4.0–10.5)

## 2016-07-31 LAB — BASIC METABOLIC PANEL
ANION GAP: 11 (ref 5–15)
BUN: 10 mg/dL (ref 6–20)
CO2: 24 mmol/L (ref 22–32)
Calcium: 9 mg/dL (ref 8.9–10.3)
Chloride: 104 mmol/L (ref 101–111)
Creatinine, Ser: 0.75 mg/dL (ref 0.44–1.00)
GFR calc Af Amer: >60 mL/min (ref >60)
Glucose, Bld: 109 mg/dL — ABNORMAL HIGH (ref 65–99)
POTASSIUM: 3.4 mmol/L — AB (ref 3.5–5.1)
SODIUM: 139 mmol/L (ref 135–145)

## 2016-07-31 LAB — URINALYSIS, ROUTINE W REFLEX MICROSCOPIC
BILIRUBIN URINE: NEGATIVE
Glucose, UA: NEGATIVE mg/dL
KETONES UR: NEGATIVE mg/dL
NITRITE: POSITIVE — AB
PH: 6 (ref 5.0–8.0)
Protein, ur: NEGATIVE mg/dL
Specific Gravity, Urine: 1.021 (ref 1.005–1.030)

## 2016-07-31 LAB — CBG MONITORING, ED: Glucose-Capillary: 121 mg/dL — ABNORMAL HIGH (ref 65–99)

## 2016-07-31 NOTE — ED Triage Notes (Signed)
Pt states that she had a syncopal episode while at work. Pt denies hitting head during episode, c/o pain all over, denies injury, states that she woke up and has L arm numbness, pt is unsure if she fell onto that arm, neuro intact bilaterally

## 2016-08-01 ENCOUNTER — Emergency Department (HOSPITAL_COMMUNITY)
Admission: EM | Admit: 2016-08-01 | Discharge: 2016-08-01 | Disposition: A | Discharge status: Home / Self Care | Payer: Self-pay | Attending: Emergency Medicine | Admitting: Emergency Medicine

## 2016-08-01 ENCOUNTER — Emergency Department (HOSPITAL_COMMUNITY): Payer: Self-pay

## 2016-08-01 DIAGNOSIS — N390 Urinary tract infection, site not specified: Secondary | ICD-10-CM

## 2016-08-01 DIAGNOSIS — R55 Syncope and collapse: Secondary | ICD-10-CM

## 2016-08-01 LAB — PREGNANCY, URINE: Preg Test, Ur: NEGATIVE

## 2016-08-01 MED ORDER — CEPHALEXIN 500 MG PO CAPS: 500.0000 mg | ORAL_CAPSULE | Freq: Two times a day (BID) | ORAL | 0 refills | Status: DC

## 2016-08-01 MED ORDER — CEPHALEXIN 250 MG PO CAPS
500.0000 mg | ORAL_CAPSULE | Freq: Once | ORAL | Status: AC
  Administered 2016-08-01: 500 mg via ORAL
  Filled 2016-08-01: qty 2

## 2016-08-01 NOTE — ED Notes (Signed)
Patient transported to CT 

## 2016-08-01 NOTE — ED Notes (Signed)
Lab contacted to add on urine pregnancy °
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
## 2016-08-01 NOTE — ED Notes (Signed)
Patient called for room assignment, no answer.

## 2016-08-01 NOTE — ED Notes (Signed)
ED Provider at bedside. 

## 2016-10-25 ENCOUNTER — Observation Stay (HOSPITAL_COMMUNITY)
Admission: EM | Admit: 2016-10-25 | Discharge: 2016-10-26 | Disposition: A | Discharge status: Home / Self Care | Payer: Self-pay | Attending: Family Medicine | Admitting: Family Medicine

## 2016-10-25 ENCOUNTER — Emergency Department (HOSPITAL_COMMUNITY): Payer: Self-pay

## 2016-10-25 ENCOUNTER — Encounter (HOSPITAL_COMMUNITY): Payer: Self-pay | Admitting: *Deleted

## 2016-10-25 DIAGNOSIS — E872 Acidosis: Secondary | ICD-10-CM | POA: Insufficient documentation

## 2016-10-25 DIAGNOSIS — D72829 Elevated white blood cell count, unspecified: Secondary | ICD-10-CM

## 2016-10-25 DIAGNOSIS — R1084 Generalized abdominal pain: Principal | ICD-10-CM | POA: Insufficient documentation

## 2016-10-25 DIAGNOSIS — E860 Dehydration: Secondary | ICD-10-CM

## 2016-10-25 DIAGNOSIS — J45909 Unspecified asthma, uncomplicated: Secondary | ICD-10-CM | POA: Insufficient documentation

## 2016-10-25 DIAGNOSIS — R6510 Systemic inflammatory response syndrome (SIRS) of non-infectious origin without acute organ dysfunction: Secondary | ICD-10-CM

## 2016-10-25 DIAGNOSIS — R109 Unspecified abdominal pain: Secondary | ICD-10-CM | POA: Diagnosis present

## 2016-10-25 HISTORY — DX: Pneumothorax, unspecified: J93.9

## 2016-10-25 LAB — I-STAT VENOUS BLOOD GAS, ED
ACID-BASE DEFICIT: 17 mmol/L — AB (ref 0.0–2.0)
Bicarbonate: 10.3 mmol/L — ABNORMAL LOW (ref 20.0–28.0)
O2 SAT: 95 %
PCO2 VEN: 28 mmHg — AB (ref 44.0–60.0)
TCO2: 11 mmol/L (ref 0–100)
pH, Ven: 7.172 — CL (ref 7.250–7.430)
pO2, Ven: 96 mmHg — ABNORMAL HIGH (ref 32.0–45.0)

## 2016-10-25 LAB — ACETAMINOPHEN LEVEL: Acetaminophen (Tylenol), Serum: <10 ug/mL — ABNORMAL LOW (ref 10–30)

## 2016-10-25 LAB — COMPREHENSIVE METABOLIC PANEL
ALT: 34 U/L (ref 14–54)
AST: 67 U/L — AB (ref 15–41)
Albumin: 5.3 g/dL — ABNORMAL HIGH (ref 3.5–5.0)
Alkaline Phosphatase: 62 U/L (ref 38–126)
Anion gap: 19 — ABNORMAL HIGH (ref 5–15)
BUN: 14 mg/dL (ref 6–20)
CHLORIDE: 112 mmol/L — AB (ref 101–111)
CO2: 7 mmol/L — AB (ref 22–32)
CREATININE: 1 mg/dL (ref 0.44–1.00)
Calcium: 9.8 mg/dL (ref 8.9–10.3)
GFR calc Af Amer: >60 mL/min (ref >60)
GFR calc non Af Amer: >60 mL/min (ref >60)
Glucose, Bld: 76 mg/dL (ref 65–99)
Potassium: 5.1 mmol/L (ref 3.5–5.1)
SODIUM: 138 mmol/L (ref 135–145)
Total Bilirubin: 0.6 mg/dL (ref 0.3–1.2)
Total Protein: 9 g/dL — ABNORMAL HIGH (ref 6.5–8.1)

## 2016-10-25 LAB — CBC
HCT: 33 % — ABNORMAL LOW (ref 36.0–46.0)
Hemoglobin: 10.3 g/dL — ABNORMAL LOW (ref 12.0–15.0)
MCH: 20.9 pg — ABNORMAL LOW (ref 26.0–34.0)
MCHC: 31.2 g/dL (ref 30.0–36.0)
MCV: 67.1 fL — ABNORMAL LOW (ref 78.0–100.0)
Platelets: 428 K/uL — ABNORMAL HIGH (ref 150–400)
RBC: 4.92 MIL/uL (ref 3.87–5.11)
RDW: 19.6 % — ABNORMAL HIGH (ref 11.5–15.5)
WBC: 21.4 K/uL — ABNORMAL HIGH (ref 4.0–10.5)

## 2016-10-25 LAB — URINALYSIS, ROUTINE W REFLEX MICROSCOPIC
Bilirubin Urine: NEGATIVE
Glucose, UA: NEGATIVE mg/dL
Ketones, ur: 20 mg/dL — AB
Leukocytes, UA: NEGATIVE
Nitrite: NEGATIVE
Protein, ur: 100 mg/dL — AB
RBC / HPF: NONE SEEN RBC/hpf (ref 0–5)
Specific Gravity, Urine: 1.015 (ref 1.005–1.030)
pH: 5 (ref 5.0–8.0)

## 2016-10-25 LAB — WET PREP, GENITAL
Sperm: NONE SEEN
TRICH WET PREP: NONE SEEN
Yeast Wet Prep HPF POC: NONE SEEN

## 2016-10-25 LAB — CBG MONITORING, ED: GLUCOSE-CAPILLARY: 90 mg/dL (ref 65–99)

## 2016-10-25 LAB — I-STAT BETA HCG BLOOD, ED (MC, WL, AP ONLY): I-stat hCG, quantitative: <5 m[IU]/mL

## 2016-10-25 LAB — LIPASE, BLOOD: LIPASE: 20 U/L (ref 11–51)

## 2016-10-25 LAB — I-STAT CG4 LACTIC ACID, ED
LACTIC ACID, VENOUS: 1.56 mmol/L (ref 0.5–1.9)
Lactic Acid, Venous: 9.12 mmol/L (ref 0.5–1.9)

## 2016-10-25 LAB — SALICYLATE LEVEL: Salicylate Lvl: <7 mg/dL (ref 2.8–30.0)

## 2016-10-25 MED ORDER — IOPAMIDOL (ISOVUE-300) INJECTION 61%
INTRAVENOUS | Status: AC
  Administered 2016-10-25: 100 mL
  Filled 2016-10-25: qty 100

## 2016-10-25 MED ORDER — VANCOMYCIN HCL IN DEXTROSE 1-5 GM/200ML-% IV SOLN
1000.0000 mg | Freq: Once | INTRAVENOUS | Status: AC
  Administered 2016-10-25: 1000 mg via INTRAVENOUS
  Filled 2016-10-25: qty 200

## 2016-10-25 MED ORDER — SODIUM CHLORIDE 0.9 % IV BOLUS (SEPSIS)
1000.0000 mL | Freq: Once | INTRAVENOUS | Status: AC
  Administered 2016-10-25: 1000 mL via INTRAVENOUS

## 2016-10-25 MED ORDER — ACETAMINOPHEN 650 MG RE SUPP: 650.0000 mg | Freq: Four times a day (QID) | RECTAL | Status: DC | PRN

## 2016-10-25 MED ORDER — ENOXAPARIN SODIUM 40 MG/0.4ML ~~LOC~~ SOLN
40.0000 mg | SUBCUTANEOUS | Status: DC
  Administered 2016-10-25 – 2016-10-26 (×2): 40 mg via SUBCUTANEOUS
  Filled 2016-10-25 (×2): qty 0.4

## 2016-10-25 MED ORDER — ACETAMINOPHEN 325 MG PO TABS
650.0000 mg | ORAL_TABLET | Freq: Four times a day (QID) | ORAL | Status: DC | PRN
  Administered 2016-10-25 – 2016-10-26 (×3): 650 mg via ORAL
  Filled 2016-10-25 (×3): qty 2

## 2016-10-25 MED ORDER — NICOTINE 7 MG/24HR TD PT24
7.0000 mg | MEDICATED_PATCH | Freq: Every day | TRANSDERMAL | Status: DC
  Administered 2016-10-25 – 2016-10-26 (×2): 7 mg via TRANSDERMAL
  Filled 2016-10-25 (×2): qty 1

## 2016-10-25 MED ORDER — MORPHINE SULFATE (PF) 4 MG/ML IV SOLN
4.0000 mg | Freq: Once | INTRAVENOUS | Status: AC
  Administered 2016-10-25: 4 mg via INTRAVENOUS
  Filled 2016-10-25: qty 1

## 2016-10-25 MED ORDER — SODIUM CHLORIDE 0.9 % IV SOLN
INTRAVENOUS | Status: DC
  Administered 2016-10-25 – 2016-10-26 (×2): via INTRAVENOUS

## 2016-10-25 MED ORDER — VANCOMYCIN HCL IN DEXTROSE 750-5 MG/150ML-% IV SOLN
750.0000 mg | Freq: Two times a day (BID) | INTRAVENOUS | Status: DC
  Administered 2016-10-25 – 2016-10-26 (×2): 750 mg via INTRAVENOUS
  Filled 2016-10-25 (×3): qty 150

## 2016-10-25 MED ORDER — OXYCODONE-ACETAMINOPHEN 5-325 MG PO TABS
1.0000 | ORAL_TABLET | Freq: Once | ORAL | Status: AC
  Administered 2016-10-25: 1 via ORAL
  Filled 2016-10-25: qty 1

## 2016-10-25 MED ORDER — ONDANSETRON HCL 4 MG/2ML IJ SOLN
4.0000 mg | Freq: Four times a day (QID) | INTRAMUSCULAR | Status: DC | PRN
  Administered 2016-10-26: 4 mg via INTRAVENOUS
  Filled 2016-10-25: qty 2

## 2016-10-25 MED ORDER — ONDANSETRON HCL 4 MG PO TABS: 4.0000 mg | ORAL_TABLET | Freq: Four times a day (QID) | ORAL | Status: DC | PRN

## 2016-10-25 MED ORDER — PIPERACILLIN-TAZOBACTAM 3.375 G IVPB
3.3750 g | Freq: Three times a day (TID) | INTRAVENOUS | Status: DC
  Administered 2016-10-25 – 2016-10-26 (×4): 3.375 g via INTRAVENOUS
  Filled 2016-10-25 (×5): qty 50

## 2016-10-25 MED ORDER — PIPERACILLIN-TAZOBACTAM 3.375 G IVPB 30 MIN
3.3750 g | Freq: Once | INTRAVENOUS | Status: AC
  Administered 2016-10-25: 3.375 g via INTRAVENOUS
  Filled 2016-10-25: qty 50

## 2016-10-25 MED ORDER — IBUPROFEN 400 MG PO TABS
400.0000 mg | ORAL_TABLET | Freq: Four times a day (QID) | ORAL | Status: DC | PRN
  Filled 2016-10-25: qty 1

## 2016-10-25 MED ORDER — ONDANSETRON HCL 4 MG/2ML IJ SOLN
4.0000 mg | Freq: Once | INTRAMUSCULAR | Status: AC
  Administered 2016-10-25: 4 mg via INTRAVENOUS
  Filled 2016-10-25: qty 2

## 2016-10-25 NOTE — ED Provider Notes (Signed)
[facility]-EMERGENCY DEPT Provider Note   CSN: [id number] Arrival date & time: 10/25/16  1191     History   Chief Complaint Chief Complaint  Patient presents with  . Emesis  . Abdominal Pain    HPI Victoria Cline is a 26 y.o. female.  The history is provided by the patient and medical records. No language interpreter was used.  Emesis   Associated symptoms include abdominal pain and myalgias. Pertinent negatives include no diarrhea.  Abdominal Pain   Associated symptoms include nausea, vomiting and myalgias. Pertinent negatives include diarrhea.   Victoria Cline is a 26 y.o. female  with a PMH of asthma, anxiety who presents to the Emergency Department complaining of acute onset of generalized abdominal pain which began at 10 PM last night. Associated symptoms include nausea and multiple episodes of vomiting. Patient notes up to 10 episodes of emesis. Initially was mostly food products, then switched to liquid consistency, now just feels as if she is dry heaving. Denies diarrhea. She tried to take ginger ale to settle her stomach prior to arrival with success. No medications taken prior to arrival. No alleviating or aggravating factors. This morning, her abdominal pain worsened and she felt pain "all over". No history of similar sxs. No fevers, chills, dysuria, vaginal discharge. No sick contacts. No prior abdominal surgeries.   Past Medical History:  Diagnosis Date  . Anxiety   . Asthma   . Fracture, pelvis closed (HCC)   . Pneumothorax     Patient Active Problem List   Diagnosis Date Noted  . Abdominal pain 10/25/2016  . 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 Exposure to STD 01/31/2012  . Asthma 03/31/2011  . Mood disorder (HCC) 03/31/2011    Past Surgical History:  Procedure Laterality Date  . NO PAST SURGERIES      OB History    Gravida Para Term Preterm AB Living   3       2     SAB TAB Ectopic Multiple Live Births   2               Home Medications      Prior to Admission medications   Not on File    Family History Family History  Problem Relation Age of Onset  . Cancer Mother        Living, brain tumor    Social History Social History  Substance Use Topics  . Smoking status: Current Every Day Smoker    Packs/day: 0.50    Years: 5.00    Types: Cigarettes  . Smokeless tobacco: Never Used  . Alcohol use No     Allergies   Patient has no known allergies.   Review of Systems Review of Systems  Gastrointestinal: Positive for abdominal pain, nausea and vomiting. Negative for blood in stool and diarrhea.  Musculoskeletal: Positive for myalgias.  All other systems reviewed and are negative.    Physical Exam Updated Vital Signs BP 119/83 (BP Location: Left Arm)   Pulse 90   Temp 98.3 F (36.8 C) (Oral)   Resp 12   Wt 54.4 kg (120 lb)   LMP 10/05/2016   SpO2 99%   BMI 21.26 kg/m   Physical Exam  Constitutional: She is oriented to person, place, and time. She appears well-developed and well-nourished.  Appears anxious and very uncomfortable.  HENT:  Head: Normocephalic and atraumatic.  Cardiovascular: Normal heart sounds.   No murmur heard. Tachycardic but regular.  Pulmonary/Chest: Effort normal and breath  sounds normal. No respiratory distress. She has no wheezes. She has no rales.  Abdominal: Soft. She exhibits no distension. There is tenderness.  Diffuse tenderness to palpation with guarding.  Genitourinary:  Genitourinary Comments: Chaperone present for exam. Small amount of white discharge. No CMT. Mild right adnexal tenderness.  No bleeding within vaginal vault.  Musculoskeletal: She exhibits no edema.  Neurological: She is alert and oriented to person, place, and time.  Skin: Skin is warm and dry.  Nursing note and vitals reviewed.    ED Treatments / Results  Labs (all labs ordered are listed, but only abnormal results are displayed) Labs Reviewed  WET PREP, GENITAL - Abnormal; Notable for the  following:       Result Value   Clue Cells Wet Prep HPF POC PRESENT (*)    WBC, Wet Prep HPF POC MODERATE (*)    All other components within normal limits  COMPREHENSIVE METABOLIC PANEL - Abnormal; Notable for the following:    Chloride 112 (*)    CO2 7 (*)    Total Protein 9.0 (*)    Albumin 5.3 (*)    AST 67 (*)    Anion gap 19 (*)    All other components within normal limits  CBC - Abnormal; Notable for the following:    WBC 21.4 (*)    Hemoglobin 10.3 (*)    HCT 33.0 (*)    MCV 67.1 (*)    MCH 20.9 (*)    RDW 19.6 (*)    Platelets 428 (*)    All other components within normal limits  URINALYSIS, ROUTINE W REFLEX MICROSCOPIC - Abnormal; Notable for the following:    Hgb urine dipstick MODERATE (*)    Ketones, ur 20 (*)    Protein, ur 100 (*)    Bacteria, UA RARE (*)    Squamous Epithelial / LPF 0-5 (*)    All other components within normal limits  ACETAMINOPHEN LEVEL - Abnormal; Notable for the following:    Acetaminophen (Tylenol), Serum <10 (*)    All other components within normal limits  I-STAT CG4 LACTIC ACID, ED - Abnormal; Notable for the following:    Lactic Acid, Venous 9.12 (*)    All other components within normal limits  I-STAT VENOUS BLOOD GAS, ED - Abnormal; Notable for the following:    pH, Ven 7.172 (*)    pCO2, Ven 28.0 (*)    pO2, Ven 96.0 (*)    Bicarbonate 10.3 (*)    Acid-base deficit 17.0 (*)    All other components within normal limits  CULTURE, BLOOD (ROUTINE X 2)  CULTURE, BLOOD (ROUTINE X 2)  LIPASE, BLOOD  SALICYLATE LEVEL  BLOOD GAS, VENOUS  I-STAT BETA HCG BLOOD, ED ([facility], [facility], [facility] ONLY)  CBG MONITORING, ED  I-STAT CG4 LACTIC ACID, ED  GC[facility]CHLAMYDIA PROBE AMP ([facility]    EKG  EKG Interpretation  Date/Time:  Sunday October 25 2016 06:55:23 EDT Ventricular Rate:  114 PR Interval:    QRS Duration: 83 QT Interval:  343 QTC Calculation: 473 R Axis:   91 Text Interpretation:  Fast sinus arrhythmia Right atrial  enlargement Borderline right axis deviation RSR' in V1 or V2, probably normal variant Minimal ST depression, inferior leads When compared to prior, tachycardia now present with possible peaked t waves.  No STEMI Confirmed by Theda Belfastegeler, Chris ([id number]) on 10/25/2016 7:47:53 AM       Radiology [place] Transvaginal Non-ob  Result Date: 10/25/2016 CLINICAL DATA:  Bilateral lower abdominopelvic pain EXAM: TRANSABDOMINAL AND TRANSVAGINAL ULTRASOUND OF PELVIS DOPPLER ULTRASOUND OF OVARIES TECHNIQUE: Both transabdominal and transvaginal ultrasound examinations of the pelvis were performed. Transabdominal technique was performed for global imaging of the pelvis including uterus, ovaries, adnexal regions, and pelvic cul-de-sac. It was necessary to proceed with endovaginal exam following the transabdominal exam to visualize the ovaries and adnexae and better detail. Color and duplex Doppler ultrasound was utilized to evaluate blood flow to the ovaries. COMPARISON:  10/25/2016 FINDINGS: Uterus Measurements: 7.1 x 4.2 x 4.6 cm. No fibroids or other mass visualized. Endometrium Thickness: 18 mm.  No focal abnormality visualized. Right ovary Measurements: 3.5 x 2.2 x 2.7 cm. Elongated complex right ovarian area measures 2.1 x 1.5 x 1.7 cm compatible with a collapsing cyst or follicle. Left ovary Measurements: 3.6 x 2.0 x 2.7 cm. Normal appearance/no adnexal mass. Pulsed Doppler evaluation of both ovaries demonstrates normal low-resistance arterial and venous waveforms. Other findings Trace pelvic free fluid likely physiologic. Prominent adnexal ovarian and uterine veins, nonspecific but can be seen with pelvic venous congestion syndrome. IMPRESSION: No acute finding by pelvic ultrasound. Probable right ovarian 2.1 cm collapsing cyst or follicle Normal ovarian Doppler evaluation Physiologic trace pelvic free fluid Prominent pelvic adnexal and uterine veins.  See above comment. Electronically Signed   By: Judie Petit.  Shick M.D.   On:  10/25/2016 11:17   US Pelvis Complete  Result Date: 10/25/2016 CLINICAL DATA:  Bilateral lower abdominopelvic pain EXAM: TRANSABDOMINAL AND TRANSVAGINAL ULTRASOUND OF PELVIS DOPPLER ULTRASOUND OF OVARIES TECHNIQUE: Both transabdominal and transvaginal ultrasound examinations of the pelvis were performed. Transabdominal technique was performed for global imaging of the pelvis including uterus, ovaries, adnexal regions, and pelvic cul-de-sac. It was necessary to proceed with endovaginal exam following the transabdominal exam to visualize the ovaries and adnexae and better detail. Color and duplex Doppler ultrasound was utilized to evaluate blood flow to the ovaries. COMPARISON:  10/25/2016 FINDINGS: Uterus Measurements: 7.1 x 4.2 x 4.6 cm. No fibroids or other mass visualized. Endometrium Thickness: 18 mm.  No focal abnormality visualized. Right ovary Measurements: 3.5 x 2.2 x 2.7 cm. Elongated complex right ovarian area measures 2.1 x 1.5 x 1.7 cm compatible with a collapsing cyst or follicle. Left ovary Measurements: 3.6 x 2.0 x 2.7 cm. Normal appearance/no adnexal mass. Pulsed Doppler evaluation of both ovaries demonstrates normal low-resistance arterial and venous waveforms. Other findings Trace pelvic free fluid likely physiologic. Prominent adnexal ovarian and uterine veins, nonspecific but can be seen with pelvic venous congestion syndrome. IMPRESSION: No acute finding by pelvic ultrasound. Probable right ovarian 2.1 cm collapsing cyst or follicle Normal ovarian Doppler evaluation Physiologic trace pelvic free fluid Prominent pelvic adnexal and uterine veins.  See above comment. Electronically Signed   By: Judie Petit.  Shick M.D.   On: 10/25/2016 11:17   Ct Abdomen Pelvis W Contrast  Result Date: 10/25/2016 CLINICAL DATA:  Low back pain, nausea EXAM: CT ABDOMEN AND PELVIS WITH CONTRAST TECHNIQUE: Multidetector CT imaging of the abdomen and pelvis was performed using the standard protocol following bolus  administration of intravenous contrast. CONTRAST:  ISOVUE-300 IOPAMIDOL (ISOVUE-300) INJECTION 61% COMPARISON:  02/28/2014 FINDINGS: Lower chest: Lung bases are clear. No effusions. Heart is normal size. Hepatobiliary: No focal hepatic abnormality. Gallbladder unremarkable. Pancreas: No focal abnormality or ductal dilatation. Spleen: No focal abnormality.  Normal size. Adrenals/Urinary Tract: No adrenal abnormality. No focal renal abnormality. No stones or hydronephrosis. Urinary bladder is unremarkable. Stomach/Bowel: Appendix is normal. Stomach, large and small bowel grossly  unremarkable. Vascular/Lymphatic: No evidence of aneurysm or adenopathy. Prominent vessels in the adnexal regions bilaterally, cannot exclude pelvis venous congestion syndrome and gonadal vein reflux. Reproductive: Uterus and adnexa unremarkable.  No mass. Other: Trace free fluid in the pelvis.  No free air. Musculoskeletal: No acute bony abnormality. IMPRESSION: Prominent paraovarian vessels bilaterally, left worse than right. Cannot exclude pelvic venous congestion syndrome and ovarian vein reflux. Otherwise no acute findings.  Normal appendix. Electronically Signed   By: Charlett Nose M.D.   On: 10/25/2016 09:29   [place] Art/ven Flow Abd Pelv Doppler  Result Date: 10/25/2016 CLINICAL DATA:  Bilateral lower abdominopelvic pain EXAM: TRANSABDOMINAL AND TRANSVAGINAL ULTRASOUND OF PELVIS DOPPLER ULTRASOUND OF OVARIES TECHNIQUE: Both transabdominal and transvaginal ultrasound examinations of the pelvis were performed. Transabdominal technique was performed for global imaging of the pelvis including uterus, ovaries, adnexal regions, and pelvic cul-de-sac. It was necessary to proceed with endovaginal exam following the transabdominal exam to visualize the ovaries and adnexae and better detail. Color and duplex Doppler ultrasound was utilized to evaluate blood flow to the ovaries. COMPARISON:  10/25/2016 FINDINGS: Uterus Measurements: 7.1 x  4.2 x 4.6 cm. No fibroids or other mass visualized. Endometrium Thickness: 18 mm.  No focal abnormality visualized. Right ovary Measurements: 3.5 x 2.2 x 2.7 cm. Elongated complex right ovarian area measures 2.1 x 1.5 x 1.7 cm compatible with a collapsing cyst or follicle. Left ovary Measurements: 3.6 x 2.0 x 2.7 cm. Normal appearance/no adnexal mass. Pulsed Doppler evaluation of both ovaries demonstrates normal low-resistance arterial and venous waveforms. Other findings Trace pelvic free fluid likely physiologic. Prominent adnexal ovarian and uterine veins, nonspecific but can be seen with pelvic venous congestion syndrome. IMPRESSION: No acute finding by pelvic ultrasound. Probable right ovarian 2.1 cm collapsing cyst or follicle Normal ovarian Doppler evaluation Physiologic trace pelvic free fluid Prominent pelvic adnexal and uterine veins.  See above comment. Electronically Signed   By: Judie Petit.  Shick M.D.   On: 10/25/2016 11:17   Dg Chest Portable 1 View  Result Date: 10/25/2016 CLINICAL DATA:  Tachycardia EXAM: PORTABLE CHEST 1 VIEW COMPARISON:  01/21/2015 FINDINGS: Heart and mediastinal contours are within normal limits. No focal opacities or effusions. No acute bony abnormality. IMPRESSION: No active disease. Electronically Signed   By: Charlett Nose M.D.   On: 10/25/2016 07:51    Procedures Procedures (including critical care time)  CRITICAL CARE Performed by: Chase Picket Marie Borowski  Total critical care time: 45 minutes  Critical care time was exclusive of separately billable procedures and treating other patients.  Critical care was necessary to treat or prevent imminent or life-threatening deterioration.  Critical care was time spent personally by me on the following activities: development of treatment plan with patient and/or surrogate as well as nursing, discussions with consultants, evaluation of patient's response to treatment, examination of patient, obtaining history from patient or  surrogate, ordering and performing treatments and interventions, ordering and review of laboratory studies, ordering and review of radiographic studies, pulse oximetry and re-evaluation of patient's condition.  Medications Ordered in ED Medications  sodium chloride 0.9 % bolus 1,000 mL (0 mLs Intravenous Stopped 10/25/16 1133)  morphine 4 MG/ML injection 4 mg (4 mg Intravenous Given 10/25/16 0732)  ondansetron (ZOFRAN) injection 4 mg (4 mg Intravenous Given 10/25/16 0732)  sodium chloride 0.9 % bolus 1,000 mL (0 mLs Intravenous Stopped 10/25/16 0948)  piperacillin-tazobactam (ZOSYN) IVPB 3.375 g (0 g Intravenous Stopped 10/25/16 0829)  vancomycin (VANCOCIN) IVPB 1000 mg/200 mL premix (0 mg Intravenous  Stopped 10/25/16 0946)  iopamidol (ISOVUE-300) 61 % injection (100 mLs  Contrast Given 10/25/16 0909)  morphine 4 MG/ML injection 4 mg (4 mg Intravenous Given 10/25/16 0945)  oxyCODONE-acetaminophen (PERCOCET/ROXICET) 5-325 MG per tablet 1 tablet (1 tablet Oral Given 10/25/16 1251)     Initial Impression / Assessment and Plan / ED Course  I have reviewed the triage vital signs and the nursing notes.  Pertinent labs & imaging results that were available during my care of the patient were reviewed by me and considered in my medical decision making (see chart for details).    Victoria Cline is a 26 y.o. female who presents to ED for generalized abdominal pain, nausea and vomiting. On exam, patient is ill-appearing, tachycardic and has diffuse abdominal tenderness with guarding. Afebrile (rectal temp obtained at 99.0). Labs and CT abd/pelvis ordered. Fluids, pain and nausea control initiated.   CBC with leukocytosis of 21.4. Lactic acid added on which came back extremely elevated at 9.12. Code sepsis called. Vanc/zosyn and weight based fluids ordered.   Patient acidotic with CO2 of 7 and AG of 19. VBG with ph of 7.172, CO2 28 and bicarb 10.3.  hcg negative, lipase wdl, APAP and salicylate level  negative. CXR negative. Urine does not appear to be source. Patient feels better after meds/fluids. CT abd/pelvis still pending.   CT shows prominent paraovarian vessels bilaterally, left worse than right but otherwise no acute findings. Pelvic ultrasound was then obtained showing no acute findings, specifically no evidence of torsion or TOA. G&C obtained. Wet prep shows moderate WBC"s and clue cells however pelvic exam clinically along with moderate WBC's not impressive to explain such an elevated WBC and lactic. Still could be related to PID. Repeat lactic now wdl at 1.56.    Teaching service consulted who will admit.   Patient seen by and discussed with Dr. Rush Landmark who agrees with treatment plan.    Final Clinical Impressions(s) / ED Diagnoses   Final diagnoses:  Abdominal pain  SIRS (systemic inflammatory response syndrome) [facility])    New Prescriptions New Prescriptions   No medications on file     Zeynab Klett, Chase Picket, PA-C 10/25/16 1307    Tegeler, Canary Brim, MD 10/26/16 1029

## 2016-10-25 NOTE — Progress Notes (Signed)
Received pt from ED, A&O x4. No nausea, no vomiting, denies abd pain at this time. Family at the bedside.  1800 Pt refused blood draw for Lactic acid, explained to pt that it is a timed draw ordered by Dr Primitivo GauzeFletcher.  Message sent to intern pager.

## 2016-10-25 NOTE — H&P (Signed)
[facility] Physical Service Pager: [phone]  Patient name: Victoria Cline Medical record number: [id number] Date of birth: 05/29/1990 Age: 26 y.o. Gender: female  Primary Care Provider: Patient, No Pcp Per Consultants: none Code Status: full  Chief Complaint: Generalized abdominal pain  Assessment and Plan: Victoria Cline is a 26 y.o. female who presents with generalized abdominal pain in all four quadrants. She states that she was feeling at baseline on 7/14 but that around 2100 she developed intractable vomiting and nausea. She initially vomited food but that she progressively starting vomiting all fluid and then dry heaving. Her abdominal pain started on the third round of vomiting. She states that during each round of vomiting she also had to urinate "a large amount" each time. Given pain medication and fluid resuscitation and symptoms have largely resolved. Eating a sandwich and burger when entered room.  Abdominal pain States that she had picked up [employer] and some shrimp from [employer]. She took a few bites of her BK but she stopped eating as she had lost her appetite. Later that night she developed n/v/abdominal pain. Presented with LA 9.12, wbc 21.4, hgb 10.3, plt 428, VBG 7.17/28/96/10, acetaminophen <10, salicylate <7.0. On admission HR 120, BP 121/87. tmax of 98. Glucose of 76. Given 4mg  morphine x2 and 2L NS which brought about some resolution of her symptoms. Most likely etiology of symptoms is acute viral gastroenteritis. This could explain increased WBC, n/v, abdominal pain, in setting of essentially normal ct scan and pelvic us. She also complaints of myalgias consistent with dehydration. Other items on differential are dka which could also explain symptoms. Given normal blood sugars this is unlikely. Pancreatitis unlikely with normal lipase. In setting of normal ct scan, appendicitis/colitis/ovarian torsion are  unlikely but still possible.Other more rare causes to be considered are mesenteric ischemia, SMA syndrome, and possibly nutcracker. Imaging not consistent with these findings. Possibly cyclic vomiting syndrome could be seen if uses marijuana although patient has never had symptoms before. - Continue fluid hydration, NS @ 9875mL/hr - Pain control, will give tylenol/Nsaids. Will consider other pain medication if pain increases/persists - kpad as needed for pain - zofran 4mg  IV prn q 8hrs for nausea/vomiting - am CBC and BMP - F/U UDS - Will consider mesenteric ultrasound if pain represents  Metabolic Acidosis/Lactic Acidosis/Acidemia Elevated LA on admission up to 9.1. Decreased down to 1.56. There are numerous causes for lactic acidosis. Decreased bicarb consistent with metabolic acidosis, anion gap of 19 at admission. Lactic acid likely elevated 2/2 nausea/vomiting noted above. While 9 is typically more elevated than would normally be found with these symptoms it is not impossible. Other possible causes would be appendicitis, colitis, ovarian torsion but these are unlikely. Patient with normal acetaminophen and salicylate levels. - continue fluids - check lactic acid 6 pm  - daily bmp  Anxiety- Patient did take klonopin for anxiety but this was stopped a few years ago. Currently no treatment.  - Will follow clinically. No treatment needed at this time - can follow up with provider as outpt  Asthma- Says that she takes albuterol prn, only needs to use when she gets a URI - albuterol PRN   FEN/GI: regular diet Prophylaxis: scd's  Disposition: place in observation  History of Present Illness:  Victoria Cline is a 26 y.o. female presenting with one day history of generalized abdominal pain which began at approximately 2200 on 7/15. She had tried to eat a whopper  and some shrimp for dinner but did not have much of an appetite. She had associated nausea and vomited "over like 20 times". She  reports this occurred from 9 pm to 6 am. Initially the vomitus contained food she had eaten to liquid then to dry heaving. No blood or coffee ground material. On arrival to ED she was given ginger ale which she was able to tolerate.   She reports severe abdominal pain and generalized body aches upon arrival to the ED. She reports this felt like aching plus sharp pains that stayed a 10/10, or greater than a 10. This pain started after her 3rd episode of vomiting.  Her pain has improved with pain medication but it is still hurting. She is endorsing stabbing chest pain that was worse with dry heaving.   On admission LA 9.12, wbc 21.4, hgb 10.3, plt 428, VBG 7.17/28/96/10, acetaminophen <10, salicylate <7.0. On admission HR 120, BP 121/87. tmax of 98. Patient with clue cells present on wet prep, with moderate wbcs. UA with moderates ketones, rare bacteria. A ct scan was obtained which showed prominent paraovarian vessels bilaterally, L>R. At that time a pelvic u/s w/ flow study was obtained which showed possible right ovarian 2.1 cyst that collapse, and some physiologic trace pelvic free fluid.  She received 2L  NS which greatly alleviated her pain. HR down to 87 on last check, LA 1.56. She received one dose of vanc/zosyn in the ed. Seh also received one dose of 4mg  IV zofran, and 2 doses of 4mg  IV morphine.  Review Of Systems: Per HPI with the following additions:   Review of Systems  Constitutional: Positive for chills, diaphoresis and fever.  HENT: Positive for sore throat. Negative for congestion.   Eyes: Positive for blurred vision. Negative for double vision.  Respiratory: Negative for cough and shortness of breath.   Cardiovascular: Positive for chest pain. Negative for leg swelling.  Gastrointestinal: Positive for abdominal pain and nausea. Negative for constipation and diarrhea.  Genitourinary: Positive for frequency and urgency. Negative for dysuria.  Musculoskeletal: Positive for myalgias.   Neurological: Negative for seizures, loss of consciousness and headaches.  Psychiatric/Behavioral: Negative for substance abuse.   Patient Active Problem List   Diagnosis Date Noted  . 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 Exposure to STD 01/31/2012  . Asthma 03/31/2011  . Mood disorder (HCC) 03/31/2011    Past Medical History: Past Medical History:  Diagnosis Date  . Anxiety   . Asthma   . Fracture, pelvis closed (HCC)   . Pneumothorax    Past Surgical History: Past Surgical History:  Procedure Laterality Date  . NO PAST SURGERIES     Social History: Social History  Substance Use Topics  . Smoking status: Current Every Day Smoker    Packs/day: 0.50    Years: 5.00    Types: Cigarettes  . Smokeless tobacco: Never Used  . Alcohol use No   Additional social history: lives with mother. Uses alcohol socially, once every 1-2 months. Denies drug use.  Please also refer to relevant sections of EMR.  Family History: Family History  Problem Relation Age of Onset  . Cancer Mother        Living, brain tumor   Maternal grandmother had stomach ulcers and needed gastrectomy ? HTN, seizure disorder No FH of clotting   Allergies and Medications: No Known Allergies No current facility-administered medications on file prior to encounter.    No current outpatient prescriptions on file prior to encounter.  Objective: BP 109/66   Pulse 87   Temp 98.3 F (36.8 C) (Oral)   Resp 17   Wt 120 lb (54.4 kg)   LMP 10/05/2016   SpO2 100%   BMI 21.26 kg/m  Exam: General: alert, oriented x4, no acute distress Eyes: eomi, perrl ENTM: external nose and ears with no signs of trauma Neck: no adenopathy, supple Cardiovascular: rrr, no rubs, gallops, or murmurs appreciated Respiratory: lungs clear to ausculation bilaterally Gastrointestinal: soft, non-distended, diffusely tender to palpation on deep palpation. No rebound, no concern for peritonitis MSK: able to move all four  extremities Derm: skin warm, dry. Several tattoos noted Vascular: Palpable dp/pt bilaterally, palpable radial pulse bilaterally Neuro: aox4, CN2-12 in tact, 5/5 strength BLE/BUE Psych: appropriate  Labs and Imaging: CBC BMET   Recent Labs Lab 10/25/16 0647  WBC 21.4*  HGB 10.3*  HCT 33.0*  PLT 428*    Recent Labs Lab 10/25/16 0647  NA 138  K 5.1  CL 112*  CO2 7*  BUN 14  CREATININE 1.00  GLUCOSE 76  CALCIUM 9.8     LA 9.2->1.6 VBG: 7.06/10/94/10  US Transvaginal Non-ob  Result Date: 10/25/2016 CLINICAL DATA:  Bilateral lower abdominopelvic pain EXAM: TRANSABDOMINAL AND TRANSVAGINAL ULTRASOUND OF PELVIS DOPPLER ULTRASOUND OF OVARIES TECHNIQUE: Both transabdominal and transvaginal ultrasound examinations of the pelvis were performed. Transabdominal technique was performed for global imaging of the pelvis including uterus, ovaries, adnexal regions, and pelvic cul-de-sac. It was necessary to proceed with endovaginal exam following the transabdominal exam to visualize the ovaries and adnexae and better detail. Color and duplex Doppler ultrasound was utilized to evaluate blood flow to the ovaries. COMPARISON:  10/25/2016 FINDINGS: Uterus Measurements: 7.1 x 4.2 x 4.6 cm. No fibroids or other mass visualized. Endometrium Thickness: 18 mm.  No focal abnormality visualized. Right ovary Measurements: 3.5 x 2.2 x 2.7 cm. Elongated complex right ovarian area measures 2.1 x 1.5 x 1.7 cm compatible with a collapsing cyst or follicle. Left ovary Measurements: 3.6 x 2.0 x 2.7 cm. Normal appearance/no adnexal mass. Pulsed Doppler evaluation of both ovaries demonstrates normal low-resistance arterial and venous waveforms. Other findings Trace pelvic free fluid likely physiologic. Prominent adnexal ovarian and uterine veins, nonspecific but can be seen with pelvic venous congestion syndrome. IMPRESSION: No acute finding by pelvic ultrasound. Probable right ovarian 2.1 cm collapsing cyst or follicle  Normal ovarian Doppler evaluation Physiologic trace pelvic free fluid Prominent pelvic adnexal and uterine veins.  See above comment. Electronically Signed   By: Judie Petit.  Shick M.D.   On: 10/25/2016 11:17   US Pelvis Complete  Result Date: 10/25/2016 CLINICAL DATA:  Bilateral lower abdominopelvic pain EXAM: TRANSABDOMINAL AND TRANSVAGINAL ULTRASOUND OF PELVIS DOPPLER ULTRASOUND OF OVARIES TECHNIQUE: Both transabdominal and transvaginal ultrasound examinations of the pelvis were performed. Transabdominal technique was performed for global imaging of the pelvis including uterus, ovaries, adnexal regions, and pelvic cul-de-sac. It was necessary to proceed with endovaginal exam following the transabdominal exam to visualize the ovaries and adnexae and better detail. Color and duplex Doppler ultrasound was utilized to evaluate blood flow to the ovaries. COMPARISON:  10/25/2016 FINDINGS: Uterus Measurements: 7.1 x 4.2 x 4.6 cm. No fibroids or other mass visualized. Endometrium Thickness: 18 mm.  No focal abnormality visualized. Right ovary Measurements: 3.5 x 2.2 x 2.7 cm. Elongated complex right ovarian area measures 2.1 x 1.5 x 1.7 cm compatible with a collapsing cyst or follicle. Left ovary Measurements: 3.6 x 2.0 x 2.7 cm. Normal appearance/no adnexal mass. Pulsed  Doppler evaluation of both ovaries demonstrates normal low-resistance arterial and venous waveforms. Other findings Trace pelvic free fluid likely physiologic. Prominent adnexal ovarian and uterine veins, nonspecific but can be seen with pelvic venous congestion syndrome. IMPRESSION: No acute finding by pelvic ultrasound. Probable right ovarian 2.1 cm collapsing cyst or follicle Normal ovarian Doppler evaluation Physiologic trace pelvic free fluid Prominent pelvic adnexal and uterine veins.  See above comment. Electronically Signed   By: Judie Petit.  Shick M.D.   On: 10/25/2016 11:17   Ct Abdomen Pelvis W Contrast  Result Date: 10/25/2016 CLINICAL DATA:  Low back  pain, nausea EXAM: CT ABDOMEN AND PELVIS WITH CONTRAST TECHNIQUE: Multidetector CT imaging of the abdomen and pelvis was performed using the standard protocol following bolus administration of intravenous contrast. CONTRAST:  ISOVUE-300 IOPAMIDOL (ISOVUE-300) INJECTION 61% COMPARISON:  02/28/2014 FINDINGS: Lower chest: Lung bases are clear. No effusions. Heart is normal size. Hepatobiliary: No focal hepatic abnormality. Gallbladder unremarkable. Pancreas: No focal abnormality or ductal dilatation. Spleen: No focal abnormality.  Normal size. Adrenals/Urinary Tract: No adrenal abnormality. No focal renal abnormality. No stones or hydronephrosis. Urinary bladder is unremarkable. Stomach/Bowel: Appendix is normal. Stomach, large and small bowel grossly unremarkable. Vascular/Lymphatic: No evidence of aneurysm or adenopathy. Prominent vessels in the adnexal regions bilaterally, cannot exclude pelvis venous congestion syndrome and gonadal vein reflux. Reproductive: Uterus and adnexa unremarkable.  No mass. Other: Trace free fluid in the pelvis.  No free air. Musculoskeletal: No acute bony abnormality. IMPRESSION: Prominent paraovarian vessels bilaterally, left worse than right. Cannot exclude pelvic venous congestion syndrome and ovarian vein reflux. Otherwise no acute findings.  Normal appendix. Electronically Signed   By: Charlett Nose M.D.   On: 10/25/2016 09:29   [place] Art/ven Flow Abd Pelv Doppler  Result Date: 10/25/2016 CLINICAL DATA:  Bilateral lower abdominopelvic pain EXAM: TRANSABDOMINAL AND TRANSVAGINAL ULTRASOUND OF PELVIS DOPPLER ULTRASOUND OF OVARIES TECHNIQUE: Both transabdominal and transvaginal ultrasound examinations of the pelvis were performed. Transabdominal technique was performed for global imaging of the pelvis including uterus, ovaries, adnexal regions, and pelvic cul-de-sac. It was necessary to proceed with endovaginal exam following the transabdominal exam to visualize the ovaries and  adnexae and better detail. Color and duplex Doppler ultrasound was utilized to evaluate blood flow to the ovaries. COMPARISON:  10/25/2016 FINDINGS: Uterus Measurements: 7.1 x 4.2 x 4.6 cm. No fibroids or other mass visualized. Endometrium Thickness: 18 mm.  No focal abnormality visualized. Right ovary Measurements: 3.5 x 2.2 x 2.7 cm. Elongated complex right ovarian area measures 2.1 x 1.5 x 1.7 cm compatible with a collapsing cyst or follicle. Left ovary Measurements: 3.6 x 2.0 x 2.7 cm. Normal appearance/no adnexal mass. Pulsed Doppler evaluation of both ovaries demonstrates normal low-resistance arterial and venous waveforms. Other findings Trace pelvic free fluid likely physiologic. Prominent adnexal ovarian and uterine veins, nonspecific but can be seen with pelvic venous congestion syndrome. IMPRESSION: No acute finding by pelvic ultrasound. Probable right ovarian 2.1 cm collapsing cyst or follicle Normal ovarian Doppler evaluation Physiologic trace pelvic free fluid Prominent pelvic adnexal and uterine veins.  See above comment. Electronically Signed   By: Judie Petit.  Shick M.D.   On: 10/25/2016 11:17   Dg Chest Portable 1 View  Result Date: 10/25/2016 CLINICAL DATA:  Tachycardia EXAM: PORTABLE CHEST 1 VIEW COMPARISON:  01/21/2015 FINDINGS: Heart and mediastinal contours are within normal limits. No focal opacities or effusions. No acute bony abnormality. IMPRESSION: No active disease. Electronically Signed   By: Charlett Nose M.D.   On:  10/25/2016 07:51    Myrene Buddy, MD 10/25/2016, 12:28 PM PGY-1, [facility] FPTS Intern pager: [phone], text pages welcome   FPTS Upper-Level Resident Addendum  I have independently interviewed and examined the patient. I have discussed the above with the original author and agree with their documentation. My edits for correction/addition/clarification are in green.Please see also any attending notes.   Dolores Patty, DO PGY-2,   Family Medicine FPTS Service pager: [phone] (text pages welcome through [facility])

## 2016-10-25 NOTE — ED Triage Notes (Signed)
Pt c/o NV with generalized abd pain since last night; pt restless and hyperventilating.

## 2016-10-25 NOTE — Progress Notes (Signed)
Pharmacy Antibiotic Note  Victoria HackSamira Najaah Cline is a 26 y.o. female admitted on 10/25/2016 with sepsis.  Pharmacy has been consulted for vancomycin and Zosyn dosing. WBC 21.4, afebrile, LA 9.12  Plan: Vancomycin 750mg  IV every 12 hours.  Goal trough 15-20 mcg/mL. Zosyn 3.375g IV q8h (4 hour infusion). F/U clinical progression and LOT  Weight: 120 lb (54.4 kg)  Temp (24hrs), Avg:98.3 F (36.8 C), Min:98 F (36.7 C), Max:99 F (37.2 C)   Recent Labs Lab 10/25/16 0647 10/25/16 0724 10/25/16 1202  WBC 21.4*  --   --   CREATININE 1.00  --   --   LATICACIDVEN  --  9.12* 1.56    Estimated Creatinine Clearance: 71.8 mL/min (by C-G formula based on SCr of 1 mg/dL).    No Known Allergies  Antimicrobials this admission: Vancomycin 7/15>> Zosyn 7/15>>  Dose adjustments this admission: N/A  Microbiology results: 7/15 BCx: pending  Thank you for allowing pharmacy to be a part of this patient's care.  Toniann Failony L Blakley Michna 10/25/2016 4:25 PM

## 2016-10-25 NOTE — ED Notes (Signed)
Patient transported to Ultrasound 

## 2016-10-25 NOTE — ED Notes (Signed)
Patient transported to CT 

## 2016-10-25 NOTE — ED Notes (Signed)
Pt given gingerale and a sandwich.

## 2016-10-26 ENCOUNTER — Observation Stay (HOSPITAL_COMMUNITY): Payer: Self-pay

## 2016-10-26 DIAGNOSIS — D72828 Other elevated white blood cell count: Secondary | ICD-10-CM

## 2016-10-26 LAB — BASIC METABOLIC PANEL
ANION GAP: 4 — AB (ref 5–15)
BUN: 7 mg/dL (ref 6–20)
CHLORIDE: 110 mmol/L (ref 101–111)
CO2: 21 mmol/L — AB (ref 22–32)
CREATININE: 0.74 mg/dL (ref 0.44–1.00)
Calcium: 8.3 mg/dL — ABNORMAL LOW (ref 8.9–10.3)
GFR calc Af Amer: >60 mL/min (ref >60)
GFR calc non Af Amer: >60 mL/min (ref >60)
Glucose, Bld: 107 mg/dL — ABNORMAL HIGH (ref 65–99)
POTASSIUM: 3.9 mmol/L (ref 3.5–5.1)
Sodium: 135 mmol/L (ref 135–145)

## 2016-10-26 LAB — CBC
HEMATOCRIT: 25.8 % — AB (ref 36.0–46.0)
HEMOGLOBIN: 8.2 g/dL — AB (ref 12.0–15.0)
MCH: 20.6 pg — AB (ref 26.0–34.0)
MCHC: 31.8 g/dL (ref 30.0–36.0)
MCV: 64.8 fL — AB (ref 78.0–100.0)
Platelets: 314 10*3/uL (ref 150–400)
RBC: 3.98 MIL/uL (ref 3.87–5.11)
RDW: 19 % — ABNORMAL HIGH (ref 11.5–15.5)
WBC: 9 10*3/uL (ref 4.0–10.5)

## 2016-10-26 LAB — GC/CHLAMYDIA PROBE AMP (~~LOC~~) NOT AT ARMC
Chlamydia: NEGATIVE
Neisseria Gonorrhea: NEGATIVE

## 2016-10-26 MED ORDER — MORPHINE SULFATE (PF) 4 MG/ML IV SOLN
2.0000 mg | Freq: Once | INTRAVENOUS | Status: AC
  Administered 2016-10-26: 2 mg via INTRAVENOUS
  Filled 2016-10-26: qty 1

## 2016-10-26 MED ORDER — METRONIDAZOLE 500 MG PO TABS
500.0000 mg | ORAL_TABLET | Freq: Two times a day (BID) | ORAL | Status: DC
  Administered 2016-10-26: 500 mg via ORAL
  Filled 2016-10-26: qty 1

## 2016-10-26 MED ORDER — METRONIDAZOLE 500 MG PO TABS: 500.0000 mg | ORAL_TABLET | Freq: Two times a day (BID) | ORAL | 0 refills | Status: DC

## 2016-10-26 NOTE — Progress Notes (Signed)
Patient states "IV feels funny in R. AC when she bends her arm and wants it taking out". IV removed from R. AC per nurse.

## 2016-10-26 NOTE — Progress Notes (Signed)
Patient refused am lab draw from tech. Stated that "she wants someone else to come up and take it using a butterfly needle".

## 2016-10-26 NOTE — Progress Notes (Signed)
Pt requesting anxiety medication. MD paged and notified

## 2016-10-26 NOTE — Progress Notes (Signed)
Pt asking for privileges to leave the unit for some 'fresh air' as she is anxious

## 2016-10-26 NOTE — Discharge Instructions (Signed)
You are being discharged with the diagnosis of acute viral gastroenteritis Please resume activity levels as normal prior to admission You are being discharged with a new medication called Flagyl. Please take this for 7 days, two times per day. You have a follow up appointment made in our clinic on 7/24 @ 2:45, please arrive by 2:30 We can discuss your post-hospital course and make a referral to a gynecologist at that time    Abdominal Pain, Adult Abdominal pain can be caused by many things. Often, abdominal pain is not serious and it gets better with no treatment or by being treated at home. However, sometimes abdominal pain is serious. Your health care provider will do a medical history and a physical exam to try to determine the cause of your abdominal pain. Follow these instructions at home:  Take over-the-counter and prescription medicines only as told by your health care provider. Do not take a laxative unless told by your health care provider.  Drink enough fluid to keep your urine clear or pale yellow.  Watch your condition for any changes.  Keep all follow-up visits as told by your health care provider. This is important. Contact a health care provider if:  Your abdominal pain changes or gets worse.  You are not hungry or you lose weight without trying.  You are constipated or have diarrhea for more than 2-3 days.  You have pain when you urinate or have a bowel movement.  Your abdominal pain wakes you up at night.  Your pain gets worse with meals, after eating, or with certain foods.  You are throwing up and cannot keep anything down.  You have a fever. Get help right away if:  Your pain does not go away as soon as your health care provider told you to expect.  You cannot stop throwing up.  Your pain is only in areas of the abdomen, such as the right side or the left lower portion of the abdomen.  You have bloody or black stools, or stools that look like  tar.  You have severe pain, cramping, or bloating in your abdomen.  You have signs of dehydration, such as: ? Dark urine, very little urine, or no urine. ? Cracked lips. ? Dry mouth. ? Sunken eyes. ? Sleepiness. ? Weakness. This information is not intended to replace advice given to you by your health care provider. Make sure you discuss any questions you have with your health care provider. Document Released: 01/07/2005 Document Revised: 10/18/2015 Document Reviewed: 09/11/2015 Elsevier Interactive Patient Education  2017 [employer]

## 2016-10-26 NOTE — Progress Notes (Signed)
Family Medicine Teaching Service Daily Progress Note Intern Pager: [phone]  Patient name: Victoria Cline Medical record number: [id number] Date of birth: 09/04/1990 Age: 26 y.o. Gender: female  Primary Care Provider: Patient, No Pcp Per Consultants: none Code Status: full  Pt Overview and Major Events to Date:  Victoria Cline is a 26 y.o. female who presents with generalized abdominal pain in all four quadrants. She states that she was feeling at baseline on 7/14 but that around 2100 she developed intractable vomiting and nausea. She initially vomited food but that she progressively starting vomiting all fluid and then dry heaving. Her abdominal pain started on the third round of vomiting. She states that during each round of vomiting she also had to urinate "a large amount" each time. Given pain medication and fluid resuscitation and symptoms have largely resolved. Eating a sandwich and burger when entered room. She was given morphine and fluid which resolved her pain and improved her labs.  Assessment and Plan: Abdominal pain Presented with LA 9.12, wbc 21.4, hgb 10.3, plt 428, VBG 7.17/28/96/10, acetaminophen <10, salicylate <7.0. On admission HR 120, BP 121/87. tmax of 98. Glucose of 76. Given 4mg  morphine x2 and 2L NS which brought about some resolution of her symptoms. Most likely etiology of symptoms is acute viral gastroenteritis. This could explain increased WBC, n/v, abdominal pain, in setting of essentially normal ct scan and pelvic us. She also complaints of myalgias consistent with dehydration. Other items on differential are dka which could also explain symptoms. Given normal blood sugars this is unlikely. Pancreatitis unlikely with normal lipase. In setting of normal ct scan, appendicitis/colitis/ovarian torsion are unlikely but still possible. Other more rare causes to be considered are mesenteric ischemia, SMA syndrome, and possibly nutcracker. Imaging not consistent with  these findings. Possibly cyclic vomiting syndrome could be seen if uses marijuana although patient has never had symptoms before. Symptoms seem to have resolved. Will follow up blood cultures draw at admission, if ok can likely dc this afternoon. - will stop vanc/zosyn, can likely dc with no abx vs po pending blood cultures - ok to stop iv fluids - follow up blood cultures - Pain control, will give tylenol/Nsaids. Will consider other pain medication if pain increases/persists - zofran 4mg  IV prn q 8hrs for nausea/vomiting  Metabolic Acidosis/Lactic Acidosis/Acidemia Elevated LA on admission up to 9.1. Decreased down to 1.56. There are numerous causes for lactic acidosis. Decreased bicarb consistent with metabolic acidosis, anion gap of 19 at admission. Lactic acid likely elevated 2/2 nausea/vomiting noted above. While 9 is typically more elevated than would normally be found with these symptoms it is not impossible. Other possible causes would be appendicitis, colitis, ovarian torsion but these are unlikely. Patient with normal acetaminophen and salicylate levels.  Anxiety- Patient did take klonopin for anxiety but this was stopped a few years ago. Currently no treatment.  - Will follow clinically. No treatment needed at this time - can follow up with provider as outpt  Asthma- Says that she takes albuterol prn, only needs to use when she gets a URI - albuterol PRN  FEN/GI: regular diet Prophylaxis: scd's  Disposition: home  Subjective:  No complaints this am. Apparently some anxiety after leaving the room. Pain better controlled. No n/v. No bm. Able to ambulate.  Objective: Temp:  [98 F (36.7 C)-98.5 F (36.9 C)] 98.5 F (36.9 C) (07/16 0524) Pulse Rate:  [63-95] 63 (07/16 0524) Resp:  [12-20] 18 (07/16 0524) BP: (90-119)/(43-83) 97/52 (07/16 0524)  SpO2:  [97 %-100 %] 100 % (07/16 0524) Physical Exam: General: alert, oriented x4, no acute distress Cardiovascular: rrr, no  rubs, gallops, or murmurs appreciated Respiratory: lungs clear to ausculation bilaterally Gastrointestinal: soft, non-distended, diffusely tender to palpation on deep palpation. No rebound, no concern for peritonitis MSK: able to move all four extremities Derm: skin warm, dry. Several tattoos noted Vascular: Palpable dp/pt bilaterally, palpable radial pulse bilaterally Neuro: aox4, cn 2-12 intact, 5/5 strength BLE/BUE Psych: appropriate  Laboratory:  Recent Labs Lab 10/25/16 0647 10/26/16 0853  WBC 21.4* 9.0  HGB 10.3* 8.2*  HCT 33.0* 25.8*  PLT 428* 314    Recent Labs Lab 10/25/16 0647 10/26/16 0853  NA 138 135  K 5.1 3.9  CL 112* 110  CO2 7* 21*  BUN 14 7  CREATININE 1.00 0.74  CALCIUM 9.8 8.3*  PROT 9.0*  --   BILITOT 0.6  --   ALKPHOS 62  --   ALT 34  --   AST 67*  --   GLUCOSE 76 107*      Imaging/Diagnostic Tests: Result Date: 10/25/2016 CLINICAL DATA:  Bilateral lower abdominopelvic pain EXAM: TRANSABDOMINAL AND TRANSVAGINAL ULTRASOUND OF PELVIS DOPPLER ULTRASOUND OF OVARIES TECHNIQUE: Both transabdominal and transvaginal ultrasound examinations of the pelvis were performed. Transabdominal technique was performed for global imaging of the pelvis including uterus, ovaries, adnexal regions, and pelvic cul-de-sac. It was necessary to proceed with endovaginal exam following the transabdominal exam to visualize the ovaries and adnexae and better detail. Color and duplex Doppler ultrasound was utilized to evaluate blood flow to the ovaries. COMPARISON:  10/25/2016 FINDINGS: Uterus Measurements: 7.1 x 4.2 x 4.6 cm. No fibroids or other mass visualized. Endometrium Thickness: 18 mm.  No focal abnormality visualized. Right ovary Measurements: 3.5 x 2.2 x 2.7 cm. Elongated complex right ovarian area measures 2.1 x 1.5 x 1.7 cm compatible with a collapsing cyst or follicle. Left ovary Measurements: 3.6 x 2.0 x 2.7 cm. Normal appearance/no adnexal mass. Pulsed Doppler evaluation  of both ovaries demonstrates normal low-resistance arterial and venous waveforms. Other findings Trace pelvic free fluid likely physiologic. Prominent adnexal ovarian and uterine veins, nonspecific but can be seen with pelvic venous congestion syndrome. IMPRESSION: No acute finding by pelvic ultrasound. Probable right ovarian 2.1 cm collapsing cyst or follicle Normal ovarian Doppler evaluation Physiologic trace pelvic free fluid Prominent pelvic adnexal and uterine veins.  See above comment. Electronically Signed   By: Judie Petit.  Shick M.D.   On: 10/25/2016 11:17   US Pelvis Complete  Result Date: 10/25/2016 CLINICAL DATA:  Bilateral lower abdominopelvic pain EXAM: TRANSABDOMINAL AND TRANSVAGINAL ULTRASOUND OF PELVIS DOPPLER ULTRASOUND OF OVARIES TECHNIQUE: Both transabdominal and transvaginal ultrasound examinations of the pelvis were performed. Transabdominal technique was performed for global imaging of the pelvis including uterus, ovaries, adnexal regions, and pelvic cul-de-sac. It was necessary to proceed with endovaginal exam following the transabdominal exam to visualize the ovaries and adnexae and better detail. Color and duplex Doppler ultrasound was utilized to evaluate blood flow to the ovaries. COMPARISON:  10/25/2016 FINDINGS: Uterus Measurements: 7.1 x 4.2 x 4.6 cm. No fibroids or other mass visualized. Endometrium Thickness: 18 mm.  No focal abnormality visualized. Right ovary Measurements: 3.5 x 2.2 x 2.7 cm. Elongated complex right ovarian area measures 2.1 x 1.5 x 1.7 cm compatible with a collapsing cyst or follicle. Left ovary Measurements: 3.6 x 2.0 x 2.7 cm. Normal appearance/no adnexal mass. Pulsed Doppler evaluation of both ovaries demonstrates normal low-resistance arterial and venous waveforms.  Other findings Trace pelvic free fluid likely physiologic. Prominent adnexal ovarian and uterine veins, nonspecific but can be seen with pelvic venous congestion syndrome. IMPRESSION: No acute finding  by pelvic ultrasound. Probable right ovarian 2.1 cm collapsing cyst or follicle Normal ovarian Doppler evaluation Physiologic trace pelvic free fluid Prominent pelvic adnexal and uterine veins.  See above comment. Electronically Signed   By: Judie Petit.  Shick M.D.   On: 10/25/2016 11:17   Ct Abdomen Pelvis W Contrast  Result Date: 10/25/2016 CLINICAL DATA:  Low back pain, nausea EXAM: CT ABDOMEN AND PELVIS WITH CONTRAST TECHNIQUE: Multidetector CT imaging of the abdomen and pelvis was performed using the standard protocol following bolus administration of intravenous contrast. CONTRAST:  ISOVUE-300 IOPAMIDOL (ISOVUE-300) INJECTION 61% COMPARISON:  02/28/2014 FINDINGS: Lower chest: Lung bases are clear. No effusions. Heart is normal size. Hepatobiliary: No focal hepatic abnormality. Gallbladder unremarkable. Pancreas: No focal abnormality or ductal dilatation. Spleen: No focal abnormality.  Normal size. Adrenals/Urinary Tract: No adrenal abnormality. No focal renal abnormality. No stones or hydronephrosis. Urinary bladder is unremarkable. Stomach/Bowel: Appendix is normal. Stomach, large and small bowel grossly unremarkable. Vascular/Lymphatic: No evidence of aneurysm or adenopathy. Prominent vessels in the adnexal regions bilaterally, cannot exclude pelvis venous congestion syndrome and gonadal vein reflux. Reproductive: Uterus and adnexa unremarkable.  No mass. Other: Trace free fluid in the pelvis.  No free air. Musculoskeletal: No acute bony abnormality. IMPRESSION: Prominent paraovarian vessels bilaterally, left worse than right. Cannot exclude pelvic venous congestion syndrome and ovarian vein reflux. Otherwise no acute findings.  Normal appendix. Electronically Signed   By: Charlett Nose M.D.   On: 10/25/2016 09:29   [place] Art/ven Flow Abd Pelv Doppler  Result Date: 10/25/2016 CLINICAL DATA:  Bilateral lower abdominopelvic pain EXAM: TRANSABDOMINAL AND TRANSVAGINAL ULTRASOUND OF PELVIS DOPPLER ULTRASOUND  OF OVARIES TECHNIQUE: Both transabdominal and transvaginal ultrasound examinations of the pelvis were performed. Transabdominal technique was performed for global imaging of the pelvis including uterus, ovaries, adnexal regions, and pelvic cul-de-sac. It was necessary to proceed with endovaginal exam following the transabdominal exam to visualize the ovaries and adnexae and better detail. Color and duplex Doppler ultrasound was utilized to evaluate blood flow to the ovaries. COMPARISON:  10/25/2016 FINDINGS: Uterus Measurements: 7.1 x 4.2 x 4.6 cm. No fibroids or other mass visualized. Endometrium Thickness: 18 mm.  No focal abnormality visualized. Right ovary Measurements: 3.5 x 2.2 x 2.7 cm. Elongated complex right ovarian area measures 2.1 x 1.5 x 1.7 cm compatible with a collapsing cyst or follicle. Left ovary Measurements: 3.6 x 2.0 x 2.7 cm. Normal appearance/no adnexal mass. Pulsed Doppler evaluation of both ovaries demonstrates normal low-resistance arterial and venous waveforms. Other findings Trace pelvic free fluid likely physiologic. Prominent adnexal ovarian and uterine veins, nonspecific but can be seen with pelvic venous congestion syndrome. IMPRESSION: No acute finding by pelvic ultrasound. Probable right ovarian 2.1 cm collapsing cyst or follicle Normal ovarian Doppler evaluation Physiologic trace pelvic free fluid Prominent pelvic adnexal and uterine veins.  See above comment. Electronically Signed   By: Judie Petit.  Shick M.D.   On: 10/25/2016 11:17   Myrene Buddy, MD 10/26/2016, 12:15 PM PGY-1, [facility] Family Medicine FPTS Intern pager: [phone], text pages welcome

## 2016-10-27 ENCOUNTER — Other Ambulatory Visit: Payer: Self-pay | Admitting: Family Medicine

## 2016-10-27 DIAGNOSIS — N9489 Other specified conditions associated with female genital organs and menstrual cycle: Secondary | ICD-10-CM

## 2016-10-27 LAB — HIV ANTIBODY (ROUTINE TESTING W REFLEX): HIV Screen 4th Generation wRfx: NONREACTIVE

## 2016-10-27 NOTE — Progress Notes (Signed)
Family Medicine Teaching [facility] Discharge Summary  Patient name: Victoria Cline Medical record number: [id number] Date of birth: October 18, 1990 Age: 26 y.o. Gender: female Date of Admission: 10/25/2016  Date of Discharge: 10/26/2016 Admitting Physician: Latrelle Dodrill, MD  Primary Care Provider: Patient, No Pcp Per Consultants: none  Indication for Hospitalization: abdominal pain, lactic acidosis  Discharge Diagnoses/Problem List:  Abdominal pain Metabolic acidosis/LA/acidemia Anxiety Asthma  Disposition: home   Discharge Condition: good  Discharge Exam: General: alert, oriented x4, no acute distress Cardiovascular: rrr, no rubs, gallops, or murmurs appreciated Respiratory: lungs clear to ausculation bilaterally Gastrointestinal: soft, non-distended, diffusely tender to palpation on deep palpation. No rebound, no concern for peritonitis MSK: able to move all four extremities Derm: skin warm, dry. Several tattoos noted Vascular: Palpable dp/pt bilaterally, palpable radial pulse bilaterally Neuro: aox4, cn 2-12 intact,5/5 strength BLE/BUE Psych: appropriate  Brief Hospital Course:  Victoria Ohm Revelsis a 26 y.o.femalewho presents with generalized abdominal pain in all four quadrants. She states that she was feeling at baseline on 7/14 but that around 2100 she developed intractable vomiting and nausea. She initially vomited food but that she progressively starting vomiting all fluid and then dry heaving. Her abdominal pain started on the third round of vomiting. She states that during each round of vomiting she also had to urinate "a large amount" each time. Given pain medication and fluid resuscitation and symptoms have largely resolved. Eating a sandwich and burger when entered room. She was given morphine and fluid which resolved her pain and improved her labs. She was admitted for obs, and kept on vanc/zosyn/fluids. She was feeling well enough to be discharged on  7/16. In light of no laboratory evidence to keep patient she was discharged at that time. She was informed of her incidental finding of dilated pelvic vessels. She was asked to follow up with gyn for that. She will follow up with me in clinic on 7/24 and she will be referred at that time. She was given a 7 day course of flagyl for BV which was present on admission.  Issues for Follow Up:  1. Ob gyn follow up  Significant Procedures: none  Significant Labs and Imaging:   Recent Labs Lab 10/25/16 0647 10/26/16 0853  WBC 21.4* 9.0  HGB 10.3* 8.2*  HCT 33.0* 25.8*  PLT 428* 314    Recent Labs Lab 10/25/16 0647 10/26/16 0853  NA 138 135  K 5.1 3.9  CL 112* 110  CO2 7* 21*  GLUCOSE 76 107*  BUN 14 7  CREATININE 1.00 0.74  CALCIUM 9.8 8.3*  ALKPHOS 62  --   AST 67*  --   ALT 34  --   ALBUMIN 5.3*  --      Results/Tests Pending at Time of Discharge: none  Discharge Medications:  Allergies as of 10/26/2016   No Known Allergies     Medication List    TAKE these medications   metroNIDAZOLE 500 MG tablet Commonly known as:  FLAGYL Take 1 tablet (500 mg total) by mouth every 12 (twelve) hours.       Discharge Instructions: Please refer to Patient Instructions section of EMR for full details.  Patient was counseled important signs and symptoms that should prompt return to medical care, changes in medications, dietary instructions, activity restrictions, and follow up appointments.   Follow-Up Appointments: Follow-up Information    Myrene Buddy, MD Follow up on 11/03/2016.   Specialty:  Family Medicine Why:  Appointment at 2:45 please show  up at 2:30 Contact information: [address] [place] [address] [phone]           Myrene BuddyFletcher, Tokiko Diefenderfer, MD 10/27/2016, 11:35 AM PGY-1, [facility]

## 2016-10-30 LAB — CULTURE, BLOOD (ROUTINE X 2)
CULTURE: NO GROWTH
Culture: NO GROWTH
Special Requests: ADEQUATE
Special Requests: ADEQUATE

## 2016-10-30 NOTE — Discharge Summary (Signed)
Family Medicine Teaching [facility] Discharge Summary  Patient name: Victoria Cline Medical record number: [id number] Date of birth: October 18, 1990 Age: 26 y.o. Gender: female Date of Admission: 10/25/2016  Date of Discharge: 10/26/2016 Admitting Physician: Latrelle Dodrill, MD  Primary Care Provider: Patient, No Pcp Per Consultants: none  Indication for Hospitalization: abdominal pain, lactic acidosis  Discharge Diagnoses/Problem List:  Abdominal pain Metabolic acidosis/LA/acidemia Anxiety Asthma  Disposition: home   Discharge Condition: good  Discharge Exam: General: alert, oriented x4, no acute distress Cardiovascular: rrr, no rubs, gallops, or murmurs appreciated Respiratory: lungs clear to ausculation bilaterally Gastrointestinal: soft, non-distended, diffusely tender to palpation on deep palpation. No rebound, no concern for peritonitis MSK: able to move all four extremities Derm: skin warm, dry. Several tattoos noted Vascular: Palpable dp/pt bilaterally, palpable radial pulse bilaterally Neuro: aox4, cn 2-12 intact,5/5 strength BLE/BUE Psych: appropriate  Brief Hospital Course:  Teesha Ohm Revelsis a 26 y.o.femalewho presents with generalized abdominal pain in all four quadrants. She states that she was feeling at baseline on 7/14 but that around 2100 she developed intractable vomiting and nausea. She initially vomited food but that she progressively starting vomiting all fluid and then dry heaving. Her abdominal pain started on the third round of vomiting. She states that during each round of vomiting she also had to urinate "a large amount" each time. Given pain medication and fluid resuscitation and symptoms have largely resolved. Eating a sandwich and burger when entered room. She was given morphine and fluid which resolved her pain and improved her labs. She was admitted for obs, and kept on vanc/zosyn/fluids. She was feeling well enough to be discharged on  7/16. In light of no laboratory evidence to keep patient she was discharged at that time. She was informed of her incidental finding of dilated pelvic vessels. She was asked to follow up with gyn for that. She will follow up with me in clinic on 7/24 and she will be referred at that time. She was given a 7 day course of flagyl for BV which was present on admission.  Issues for Follow Up:  1. Ob gyn follow up  Significant Procedures: none  Significant Labs and Imaging:   Recent Labs Lab 10/25/16 0647 10/26/16 0853  WBC 21.4* 9.0  HGB 10.3* 8.2*  HCT 33.0* 25.8*  PLT 428* 314    Recent Labs Lab 10/25/16 0647 10/26/16 0853  NA 138 135  K 5.1 3.9  CL 112* 110  CO2 7* 21*  GLUCOSE 76 107*  BUN 14 7  CREATININE 1.00 0.74  CALCIUM 9.8 8.3*  ALKPHOS 62  --   AST 67*  --   ALT 34  --   ALBUMIN 5.3*  --      Results/Tests Pending at Time of Discharge: none  Discharge Medications:  Allergies as of 10/26/2016   No Known Allergies     Medication List    TAKE these medications   metroNIDAZOLE 500 MG tablet Commonly known as:  FLAGYL Take 1 tablet (500 mg total) by mouth every 12 (twelve) hours.       Discharge Instructions: Please refer to Patient Instructions section of EMR for full details.  Patient was counseled important signs and symptoms that should prompt return to medical care, changes in medications, dietary instructions, activity restrictions, and follow up appointments.   Follow-Up Appointments: Follow-up Information    Myrene Buddy, MD Follow up on 11/03/2016.   Specialty:  Family Medicine Why:  Appointment at 2:45 please show  up at 2:30 Contact information: [address] [place] [address] [phone]           Myrene BuddyFletcher, Dahlia Nifong, MD 10/27/2016, 11:35 AM PGY-1, [facility]

## 2016-11-03 ENCOUNTER — Ambulatory Visit (INDEPENDENT_AMBULATORY_CARE_PROVIDER_SITE_OTHER): Payer: Self-pay | Admitting: Family Medicine

## 2016-11-03 ENCOUNTER — Encounter: Payer: Self-pay | Admitting: Family Medicine

## 2016-11-03 VITALS — BP 102/70 | HR 85 | Temp 98.6°F | Wt 108.0 lb

## 2016-11-03 DIAGNOSIS — R1084 Generalized abdominal pain: Secondary | ICD-10-CM

## 2016-11-03 DIAGNOSIS — Z202 Contact with and (suspected) exposure to infections with a predominantly sexual mode of transmission: Secondary | ICD-10-CM

## 2016-11-03 NOTE — Patient Instructions (Signed)
You were seen in clinic today for a hospital follow up There are no changes to your medication list be made You are ok to resume all activity prior to your visit to my clinic You are to continue your diet as tolerated prior to your visit to my clinic A referral has been made to a gynecologist on our behalf, please be on alert for a call from them for scheduling your appointment

## 2016-11-04 ENCOUNTER — Encounter: Payer: Self-pay | Admitting: Obstetrics and Gynecology

## 2016-11-07 NOTE — Progress Notes (Signed)
   HPI 26 year old with pmh of asthma presents as a follow-up from hospitalization beginning on 7/15. She presented with severe nausea and vomiting and was admitted for observation. All of her symptoms began after consuming dinner that night. On arrival she has a metabolic acidosis and 10/10 abdominal pain. After receiving fluid and pain medication this resolved. A CT scan revealed dilated paraovarian vessels. Pelvis and ovarian ultrasounds ruled out any ischemic or mass related pathology. She was diagnosed with acute viral gastroenteritis and admitted for obs. She was discharged the next day. She was found to have an incidental finding of Bacterial vaginosis. She was discharged with a script for 5 days of metronidazole.  She presents for hospital follow-up as well as to establish care to an ob/gyn. She states that she has had no n/v, abdominal, or pelvic pain since discharge. Of note she never had any pelvic pain. She has been tolerating PO and having no sequelae from her hospitalization. She has been taking her metronidazole and is about to finish her prescription.   CC: Hospital follow up   ROS: Review of Systems  Constitutional: Negative for chills, fever and weight loss.  Respiratory: Negative for cough and hemoptysis.   Cardiovascular: Negative for chest pain and palpitations.  Gastrointestinal: Negative for abdominal pain, constipation, diarrhea, nausea and vomiting.  Genitourinary: Negative for dysuria and urgency.  Neurological: Negative for dizziness and headaches.  Psychiatric/Behavioral: Negative for depression and suicidal ideas.   CC, SH/smoking status, and VS noted  Objective: BP 102/70   Pulse 85   Temp 98.6 F (37 C) (Oral)   Wt 108 lb (49 kg)   LMP 11/03/2016   BMI 19.13 kg/m  Gen: NAD, alert, cooperative, and pleasant. AOX3 HEENT: NCAT, EOMI, PERRL CV: RRR, no murmur Resp: CTAB, no wheezes, non-labored Abd: Soft, Non-tender, Non-Distended, BS present, no guarding  or organomegaly Ext: No edema, warm Neuro: Alert and oriented, Speech clear, No gross deficits   Assessment and plan:  Abdominal pain Patient with 24 hours period of abdominal pain that started after multiple rounds of emesis and profound nausea. These symptoms started 2 hours after the patient ate dinner. Found to have lactic acidosis and metabolic acidosis on admission. Best explained by viral gastroenteritis in setting of fast resolution, CT scan, and multiuple ultrasounds of pelvis that were grossly negative. Patient with incidental finding of dilated paraovarian vessels. Patient asked to be referred to gynecology for this, as well as to establish care. Will make that referral today. Patient to return to clinic prn if symptoms come back. Will see her back in one year for physical. - Return to clinic PRN  Exposure to STD Patient with incidental diagnosis of bacterial vaginosis. Gave 5 day course of metronidazole. Patient has been taking and is on her last day.   No orders of the defined types were placed in this encounter.   No orders of the defined types were placed in this encounter.    Myrene BuddyJacob Adriel Kessen MD PGY-1 Family Medicine Resident 11/13/2016 11:03 AM

## 2016-11-13 NOTE — Assessment & Plan Note (Signed)
Patient with 24 hours period of abdominal pain that started after multiple rounds of emesis and profound nausea. These symptoms started 2 hours after the patient ate dinner. Found to have lactic acidosis and metabolic acidosis on admission. Best explained by viral gastroenteritis in setting of fast resolution, CT scan, and multiuple ultrasounds of pelvis that were grossly negative. Patient with incidental finding of dilated paraovarian vessels. Patient asked to be referred to gynecology for this, as well as to establish care. Will make that referral today. Patient to return to clinic prn if symptoms come back. Will see her back in one year for physical. - Return to clinic PRN

## 2016-11-13 NOTE — Assessment & Plan Note (Signed)
Patient with incidental diagnosis of bacterial vaginosis. Gave 5 day course of metronidazole. Patient has been taking and is on her last day.

## 2016-11-24 ENCOUNTER — Inpatient Hospital Stay (HOSPITAL_COMMUNITY)
Admission: AD | Admit: 2016-11-24 | Discharge: 2016-11-24 | Disposition: A | Discharge status: Home / Self Care | Payer: Self-pay | Source: Ambulatory Visit | Attending: Obstetrics and Gynecology | Admitting: Obstetrics and Gynecology

## 2016-11-24 DIAGNOSIS — Z808 Family history of malignant neoplasm of other organs or systems: Secondary | ICD-10-CM | POA: Insufficient documentation

## 2016-11-24 DIAGNOSIS — Z3202 Encounter for pregnancy test, result negative: Secondary | ICD-10-CM | POA: Insufficient documentation

## 2016-11-24 DIAGNOSIS — F17210 Nicotine dependence, cigarettes, uncomplicated: Secondary | ICD-10-CM | POA: Insufficient documentation

## 2016-11-24 DIAGNOSIS — N939 Abnormal uterine and vaginal bleeding, unspecified: Secondary | ICD-10-CM | POA: Insufficient documentation

## 2016-11-24 LAB — URINALYSIS, ROUTINE W REFLEX MICROSCOPIC
BILIRUBIN URINE: NEGATIVE
Glucose, UA: NEGATIVE mg/dL
KETONES UR: NEGATIVE mg/dL
LEUKOCYTES UA: NEGATIVE
Nitrite: NEGATIVE
PH: 5 (ref 5.0–8.0)
Protein, ur: NEGATIVE mg/dL
Specific Gravity, Urine: 1.006 (ref 1.005–1.030)

## 2016-11-24 LAB — HCG, SERUM, QUALITATIVE: PREG SERUM: NEGATIVE

## 2016-11-24 LAB — POCT PREGNANCY, URINE: PREG TEST UR: NEGATIVE

## 2016-11-24 NOTE — Discharge Instructions (Signed)

## 2016-11-24 NOTE — MAU Provider Note (Signed)
History     CSN: [id number]  Arrival date and time: 11/24/16 [id number]   First Provider Initiated Contact with Patient 11/24/16 0148      Chief Complaint  Patient presents with  . Vaginal Bleeding   HPI [name] is a 26 y.o. G3P0020 who presents to MAU today with complaint of spotting and possible pregnancy. The patient denies abdominal pain at this time. She states LMP 11/03/16, but also states that she is late for her period. She states that she took 5 HPTs, 1 positive, 1 negative and 3 blank.   OB History    Gravida Para Term Preterm AB Living   3       2     SAB TAB Ectopic Multiple Live Births   2              Past Medical History:  Diagnosis Date  . Anxiety   . Asthma   . Fracture, pelvis closed (HCC)   . Pneumothorax     Past Surgical History:  Procedure Laterality Date  . NO PAST SURGERIES      Family History  Problem Relation Age of Onset  . Cancer Mother        Living, brain tumor    Social History  Substance Use Topics  . Smoking status: Current Every Day Smoker    Packs/day: 0.50    Years: 5.00    Types: Cigarettes  . Smokeless tobacco: Never Used  . Alcohol use No    Allergies: No Known Allergies  No prescriptions prior to admission.    Review of Systems  Gastrointestinal: Negative for abdominal pain.  Genitourinary: Positive for vaginal bleeding. Negative for urgency and vaginal discharge.   Physical Exam   Blood pressure (!) 110/53, pulse 64, temperature 98.3 F (36.8 C), temperature source Oral, resp. rate 18, height 5\' 3"  (1.6 m), weight 109 lb (49.4 kg), last menstrual period 11/03/2016, SpO2 100 %.  Physical Exam  Nursing note and vitals reviewed. Constitutional: She is oriented to person, place, and time. She appears well-developed and well-nourished. No distress.  HENT:  Head: Normocephalic and atraumatic.  Cardiovascular: Normal rate.   Respiratory: Effort normal.  GI: Soft. She exhibits no distension.   Neurological: She is alert and oriented to person, place, and time.  Skin: Skin is warm and dry. No erythema.  Psychiatric: She has a normal mood and affect.   Results for orders placed or performed during the hospital encounter of 11/24/16 (from the past 24 hour(s))  Urinalysis, Routine w reflex microscopic     Status: Abnormal   Collection Time: 11/24/16  1:00 AM  Result Value Ref Range   Color, Urine STRAW (A) YELLOW   APPearance CLEAR CLEAR   Specific Gravity, Urine 1.006 1.005 - 1.030   pH 5.0 5.0 - 8.0   Glucose, UA NEGATIVE NEGATIVE mg/dL   Hgb urine dipstick MODERATE (A) NEGATIVE   Bilirubin Urine NEGATIVE NEGATIVE   Ketones, ur NEGATIVE NEGATIVE mg/dL   Protein, ur NEGATIVE NEGATIVE mg/dL   Nitrite NEGATIVE NEGATIVE   Leukocytes, UA NEGATIVE NEGATIVE   RBC / HPF 0-5 0 - 5 RBC/hpf   WBC, UA 0-5 0 - 5 WBC/hpf   Bacteria, UA RARE (A) NONE SEEN   Squamous Epithelial / LPF 0-5 (A) NONE SEEN   Mucous PRESENT   Pregnancy, urine POC     Status: None   Collection Time: 11/24/16  1:07 AM  Result Value Ref Range   Preg  Test, Ur NEGATIVE NEGATIVE  hCG, serum, qualitative     Status: None   Collection Time: 11/24/16  1:16 AM  Result Value Ref Range   Preg, Serum NEGATIVE NEGATIVE    MAU Course  Procedures None  MDM Due to reported +HPT and negative UPT in MAU, qualitative hCG ordered Qualitative hCG is also negative Patient declines pelvic exam or STD testing today Assessment and Plan  A: Spotting Negative pregnancy test  P:  Discharge home Bleeding precautions discussed Patient advised to follow-up with [facility] as scheduled on 11/30/16 for further evaluation of irregular periods Patient may return to MAU as needed or if her condition were to change or worsen  Vonzella Nipple, PA-C 11/24/2016, 1:52 AM

## 2016-11-24 NOTE — MAU Note (Signed)
Pt here with c/o vaginal bleeding about 1500 yesterday afternoon; denies any pain. Took 5 HPT in the last 2 days, one positive, one negative, and 3 blank.

## 2016-11-30 ENCOUNTER — Encounter: Payer: Self-pay | Admitting: Obstetrics and Gynecology

## 2016-11-30 ENCOUNTER — Ambulatory Visit (INDEPENDENT_AMBULATORY_CARE_PROVIDER_SITE_OTHER): Payer: Self-pay | Admitting: Obstetrics and Gynecology

## 2016-11-30 VITALS — BP 102/68 | HR 74 | Ht 63.0 in | Wt 109.0 lb

## 2016-11-30 DIAGNOSIS — F39 Unspecified mood [affective] disorder: Secondary | ICD-10-CM

## 2016-11-30 DIAGNOSIS — Z3189 Encounter for other procreative management: Secondary | ICD-10-CM

## 2016-11-30 DIAGNOSIS — Z202 Contact with and (suspected) exposure to infections with a predominantly sexual mode of transmission: Secondary | ICD-10-CM

## 2016-11-30 LAB — POCT URINALYSIS DIP (DEVICE)
Bilirubin Urine: NEGATIVE
Glucose, UA: NEGATIVE mg/dL
KETONES UR: NEGATIVE mg/dL
Leukocytes, UA: NEGATIVE
NITRITE: NEGATIVE
PH: 5.5 (ref 5.0–8.0)
PROTEIN: NEGATIVE mg/dL
Specific Gravity, Urine: 1.025 (ref 1.005–1.030)
Urobilinogen, UA: 0.2 mg/dL (ref 0.0–1.0)

## 2016-11-30 NOTE — Progress Notes (Signed)
Patient verbally consented to meet with Behavioral Health Clinician about presenting concerns.   

## 2016-11-30 NOTE — BH Specialist Note (Deleted)
Integrated Behavioral Health Initial Visit  MRN: [id number] Name: Yvonda Argentieri Scaff   Session Start time: *** Session End time: *** Total time: {IBH Total Time:21014050}  Type of Service: Integrated Behavioral Health- Individual/Family Interpretor:No. Interpretor Name and Language: n/a   Warm Hand Off Completed.       SUBJECTIVE: Rhegan Cangemi Mcguffin is a 26 y.o. female accompanied by patient. Patient was referred by Dr Vergie Living for mood disorder. Patient reports the following symptoms/concerns: *** Duration of problem: ***; Severity of problem: {Mild/Moderate/Severe:20260}  OBJECTIVE: Mood: {BHH MOOD:22306} and Affect: {BHH AFFECT:22307} Risk of harm to self or others: No plan to harm self or others   LIFE CONTEXT: Family and Social: *** School/Work: *** Self-Care: *** Life Changes: ***  GOALS ADDRESSED: Patient will reduce symptoms of: {IBH Symptoms:21014056} and increase knowledge and/or ability of: {IBH Patient Tools:21014057} and also: {IBH Goals:21014053}   INTERVENTIONS: {IBH Interventions:21014054}  Standardized Assessments completed: {IBH Screening Tools:21014051}  ASSESSMENT: Patient currently experiencing ***. Patient may benefit from ***.  PLAN: 1. Follow up with behavioral health clinician on : *** 2. Behavioral recommendations: *** 3. Referral(s): {IBH Referrals:21014055} 4. "From scale of 1-10, how likely are you to follow plan?": ***  Rae Lips, LCSWA  Depression screen [facility] 2/9 11/30/2016 11/07/2016  Decreased Interest 2 0  Down, Depressed, Hopeless 2 0  PHQ - 2 Score 4 0  Altered sleeping 2 -  Tired, decreased energy 2 -  Change in appetite 2 -  Feeling bad or failure about yourself  1 -  Trouble concentrating 1 -  Moving slowly or fidgety/restless 0 -  Suicidal thoughts 0 -  PHQ-9 Score 12 -   GAD 7 : Generalized Anxiety Score 11/30/2016  Nervous, Anxious, on Edge 2  Control/stop worrying 1  Worry too much - different things  0  Trouble relaxing 2  Restless 1  Easily annoyed or irritable 3  Afraid - awful might happen 1  Total GAD 7 Score 10

## 2016-11-30 NOTE — Progress Notes (Signed)
Obstetrics and Gynecology New Patient Evaluation  Appointment Date: 11/30/2016  OBGYN Clinic: [facility]  Primary Care Provider: Patient, No Pcp Per  Referring Provider: Doreene Eland, MD  Chief Complaint: abnormal u/s, fertility ?  History of Present Illness: Victoria Cline is a 26 y.o. African-American G2P0020 (Patient's last menstrual period was 11/23/2016.), seen for the above chief complaint. She was referred by her PCP for an abnormal u/s  Patient had u/s on 7/15 for abdominal pain and showed possible pelvic congestion syndrome. U/s was otherwise normal.  She had negative wet prep, u/a, gc/ct and beta hcg. She doesn't have pain outside of her periods.   Patient states she's been trying for the past three months  No current VB, abdominal pain, vaginal discharge, nausea, vomiting, fevers, chills, chest pain, sob. ?dysuria at the end of the stream.   Review of Systems as noted in the History of Present Illness.  Past Medical History:  Past Medical History:  Diagnosis Date  . Anxiety   . Asthma   . Fracture, pelvis closed (HCC)   . Pneumothorax     Past Surgical History:  Past Surgical History:  Procedure Laterality Date  . NO PAST SURGERIES      Past Obstetrical History:  OB History  Gravida Para Term Preterm AB Living  2       2    SAB TAB Ectopic Multiple Live Births  2            # Outcome Date GA Lbr Len/2nd Weight Sex Delivery Anes PTL Lv  2 SAB           1 SAB             Obstetric Comments  Early SABs x 2 as teenager    Past Gynecological History: As per HPI. Periods: q21-28d, regular, no intermenstrual bleeding, x 5d, not particularly heavy or painful History of Pap Smear(s): Yes.   Last pap 2017, negative per patient She is currently using nothing for contraception.   Social History:  Social History   Social History  . Marital status: Single    Spouse name: N/A  . Number of children: N/A   . Years of education: N/A   Occupational History  . Not on file.   Social History Main Topics  . Smoking status: Current Every Day Smoker    Packs/day: 0.50    Years: 5.00    Types: Cigarettes  . Smokeless tobacco: Never Used  . Alcohol use No  . Drug use: No  . Sexual activity: Yes    Birth control/ protection: None   Other Topics Concern  . Not on file   Social History Narrative        Family History:  Family History  Problem Relation Age of Onset  . Cancer Mother        Living, brain tumor   She denies any female cancers, bleeding or blood clotting disorders.   Medications Ms. Speciale had no medications administered during this visit. No current outpatient prescriptions on file.   No current facility-administered medications for this visit.     Allergies Patient has no known allergies.   Physical Exam:  BP 102/68   Pulse 74   Ht 5\' 3"  (1.6 m)   Wt 109 lb (49.4 kg)   LMP 11/23/2016   BMI 19.31 kg/m  Body mass index is 19.31 kg/m. General appearance: Well nourished, well developed female in no acute distress.  Laboratory: u/a negative  Radiology:  CLINICAL DATA:  Bilateral lower abdominopelvic pain  EXAM: TRANSABDOMINAL AND TRANSVAGINAL ULTRASOUND OF PELVIS  DOPPLER ULTRASOUND OF OVARIES  TECHNIQUE: Both transabdominal and transvaginal ultrasound examinations of the pelvis were performed. Transabdominal technique was performed for global imaging of the pelvis including uterus, ovaries, adnexal regions, and pelvic cul-de-sac.  It was necessary to proceed with endovaginal exam following the transabdominal exam to visualize the ovaries and adnexae and better detail. Color and duplex Doppler ultrasound was utilized to evaluate blood flow to the ovaries.  COMPARISON:  10/25/2016  FINDINGS: Uterus  Measurements: 7.1 x 4.2 x 4.6 cm. No fibroids or other mass visualized.  Endometrium  Thickness: 18 mm.  No focal abnormality  visualized.  Right ovary  Measurements: 3.5 x 2.2 x 2.7 cm. Elongated complex right ovarian area measures 2.1 x 1.5 x 1.7 cm compatible with a collapsing cyst or follicle.  Left ovary  Measurements: 3.6 x 2.0 x 2.7 cm. Normal appearance/no adnexal mass.  Pulsed Doppler evaluation of both ovaries demonstrates normal low-resistance arterial and venous waveforms.  Other findings  Trace pelvic free fluid likely physiologic.  Prominent adnexal ovarian and uterine veins, nonspecific but can be seen with pelvic venous congestion syndrome.  IMPRESSION: No acute finding by pelvic ultrasound.  Probable right ovarian 2.1 cm collapsing cyst or follicle  Normal ovarian Doppler evaluation  Physiologic trace pelvic free fluid  Prominent pelvic adnexal and uterine veins.  See above comment.   Electronically Signed   By: Judie Petit.  Shick M.D.   On: 10/25/2016 11:17  Assessment: pt doing well  Plan:  1) GYN: d/w her that u/s findings are normal since she's asymptomatic and NTD. Advised that she start folic acid 1mg  po qday while trying to conceive. Partner is 78 y/o, has a 68 year old son, he does smoke but doesn't have any medical problems, surgeries or take any medications. Advise that he stop smoking and timed intercourse instructions given. Advised that if no pregnancy in 17m to call us for an appointment.   RTC PRN  Cornelia Copa MD Attending Center for [employer] [facility])

## 2017-04-12 ENCOUNTER — Inpatient Hospital Stay (HOSPITAL_COMMUNITY)
Admission: AD | Admit: 2017-04-12 | Discharge: 2017-04-12 | Disposition: A | Discharge status: Home / Self Care | Payer: Self-pay | Source: Ambulatory Visit | Attending: Obstetrics & Gynecology | Admitting: Obstetrics & Gynecology

## 2017-04-12 ENCOUNTER — Encounter (HOSPITAL_COMMUNITY): Payer: Self-pay | Admitting: *Deleted

## 2017-04-12 DIAGNOSIS — R109 Unspecified abdominal pain: Secondary | ICD-10-CM | POA: Insufficient documentation

## 2017-04-12 DIAGNOSIS — Z202 Contact with and (suspected) exposure to infections with a predominantly sexual mode of transmission: Secondary | ICD-10-CM

## 2017-04-12 DIAGNOSIS — B9689 Other specified bacterial agents as the cause of diseases classified elsewhere: Secondary | ICD-10-CM

## 2017-04-12 DIAGNOSIS — J45909 Unspecified asthma, uncomplicated: Secondary | ICD-10-CM | POA: Insufficient documentation

## 2017-04-12 DIAGNOSIS — F419 Anxiety disorder, unspecified: Secondary | ICD-10-CM | POA: Insufficient documentation

## 2017-04-12 DIAGNOSIS — Z114 Encounter for screening for human immunodeficiency virus [HIV]: Secondary | ICD-10-CM

## 2017-04-12 DIAGNOSIS — N760 Acute vaginitis: Secondary | ICD-10-CM | POA: Insufficient documentation

## 2017-04-12 DIAGNOSIS — Z3202 Encounter for pregnancy test, result negative: Secondary | ICD-10-CM | POA: Insufficient documentation

## 2017-04-12 LAB — WET PREP, GENITAL
SPERM: NONE SEEN
TRICH WET PREP: NONE SEEN
YEAST WET PREP: NONE SEEN

## 2017-04-12 LAB — URINALYSIS, ROUTINE W REFLEX MICROSCOPIC
BILIRUBIN URINE: NEGATIVE
GLUCOSE, UA: NEGATIVE mg/dL
HGB URINE DIPSTICK: NEGATIVE
KETONES UR: NEGATIVE mg/dL
LEUKOCYTES UA: NEGATIVE
Nitrite: POSITIVE — AB
PH: 6 (ref 5.0–8.0)
Protein, ur: NEGATIVE mg/dL
SPECIFIC GRAVITY, URINE: 1.011 (ref 1.005–1.030)

## 2017-04-12 LAB — CBC WITH DIFFERENTIAL/PLATELET
BASOS PCT: 1 %
Basophils Absolute: 0 10*3/uL (ref 0.0–0.1)
EOS ABS: 0 10*3/uL (ref 0.0–0.7)
Eosinophils Relative: 1 %
HEMATOCRIT: 31.3 % — AB (ref 36.0–46.0)
HEMOGLOBIN: 10 g/dL — AB (ref 12.0–15.0)
Lymphocytes Relative: 38 %
Lymphs Abs: 2.2 10*3/uL (ref 0.7–4.0)
MCH: 21.6 pg — ABNORMAL LOW (ref 26.0–34.0)
MCHC: 31.9 g/dL (ref 30.0–36.0)
MCV: 67.6 fL — ABNORMAL LOW (ref 78.0–100.0)
Monocytes Absolute: 0.2 10*3/uL (ref 0.1–1.0)
Monocytes Relative: 3 %
NEUTROS ABS: 3.5 10*3/uL (ref 1.7–7.7)
NEUTROS PCT: 57 %
Platelets: 320 10*3/uL (ref 150–400)
RBC: 4.63 MIL/uL (ref 3.87–5.11)
RDW: 18.4 % — ABNORMAL HIGH (ref 11.5–15.5)
WBC: 5.9 10*3/uL (ref 4.0–10.5)

## 2017-04-12 LAB — HCG, QUANTITATIVE, PREGNANCY: hCG, Beta Chain, Quant, S: <1 m[IU]/mL (ref <5)

## 2017-04-12 LAB — POCT PREGNANCY, URINE: Preg Test, Ur: NEGATIVE

## 2017-04-12 LAB — HEPATITIS B SURFACE ANTIGEN: Hepatitis B Surface Ag: NEGATIVE

## 2017-04-12 MED ORDER — METRONIDAZOLE 500 MG PO TABS: 500.0000 mg | ORAL_TABLET | Freq: Two times a day (BID) | ORAL | 0 refills | Status: DC

## 2017-04-12 NOTE — MAU Provider Note (Signed)
Chief Complaint  Patient presents with  . Abdominal Pain  . Possible Pregnancy     First Provider Initiated Contact with Patient 04/12/17 1636      HPI: Victoria Cline is a 26 y.o. year old 322P0020 female who presents to MAU reporting three positive and three negative home UPTs and requesting STD testing/treatment.   Symptoms: Intermittent abdominal cramping that she 6/10.  No know STD exposure Has tried the following treatments: None Reports being in a mutually monogamous relationship  Past Medical History:  Diagnosis Date  . Anxiety   . Asthma   . Fracture, pelvis closed (HCC)   . Pneumothorax     ROS: Constitutional: Neg for vaginal discharge, vaginal bleeding, dyspareunia, fever. GI: Pos for Abdominal pain GU: Neg for Vaginal discharge, vaginal bleeding, dyspareunia  PHYSICAL EXAM Patient Vitals for the past 24 hrs:  BP Temp Pulse Resp Height Weight  04/12/17 1326 (!) 118/59 98.6 F (37 C) 78 18 5\' 3"  (1.6 m) 112 lb (50.8 kg)   Physical Exam  Constitutional: She is oriented to person, place, and time. She appears well-developed and well-nourished. No distress.  Genitourinary: Vagina normal and uterus normal. No vaginal discharge found.  Genitourinary Comments: Pos malodorous discharge. No CMT. Cervix non-friable  HENT:  Head: Normocephalic.  Eyes: Conjunctivae are normal.  Cardiovascular: Normal rate.  Pulmonary/Chest: Effort normal. No respiratory distress.  Abdominal: Soft. She exhibits no distension and no mass. There is no tenderness. There is no rebound and no guarding.  Musculoskeletal: She exhibits no edema.  Neurological: She is alert and oriented to person, place, and time.  Skin: Skin is warm and dry. She is not diaphoretic.  Psychiatric: She has a normal mood and affect.  Nursing note and vitals reviewed.  Results for orders placed or performed during the hospital encounter of 04/12/17 (from the past 72 hour(s))  Urinalysis, Routine w reflex  microscopic     Status: Abnormal   Collection Time: 04/12/17  1:30 PM  Result Value Ref Range   Color, Urine YELLOW YELLOW   APPearance CLEAR CLEAR   Specific Gravity, Urine 1.011 1.005 - 1.030   pH 6.0 5.0 - 8.0   Glucose, UA NEGATIVE NEGATIVE mg/dL   Hgb urine dipstick NEGATIVE NEGATIVE   Bilirubin Urine NEGATIVE NEGATIVE   Ketones, ur NEGATIVE NEGATIVE mg/dL   Protein, ur NEGATIVE NEGATIVE mg/dL   Nitrite POSITIVE (A) NEGATIVE   Leukocytes, UA NEGATIVE NEGATIVE   RBC / HPF 0-5 0 - 5 RBC/hpf   WBC, UA 0-5 0 - 5 WBC/hpf   Bacteria, UA MANY (A) NONE SEEN   Squamous Epithelial / LPF 0-5 (A) NONE SEEN   Mucus PRESENT   Pregnancy, urine POC     Status: None   Collection Time: 04/12/17  1:54 PM  Result Value Ref Range   Preg Test, Ur NEGATIVE NEGATIVE    Comment:        THE SENSITIVITY OF THIS METHODOLOGY IS >24 mIU/mL   hCG, quantitative, pregnancy     Status: None   Collection Time: 04/12/17  2:58 PM  Result Value Ref Range   hCG, Beta Chain, Quant, S <1 <5 mIU/mL  CBC with Differential/Platelet     Status: Abnormal   Collection Time: 04/12/17  2:58 PM  Result Value Ref Range   WBC 5.9 4.0 - 10.5 K/uL   RBC 4.63 3.87 - 5.11 MIL/uL   Hemoglobin 10.0 (L) 12.0 - 15.0 g/dL   HCT 16.131.3 (L) 09.636.0 - 04.546.0 %  MCV 67.6 (L) 78.0 - 100.0 fL    Comment: REPEATED TO VERIFY   MCH 21.6 (L) 26.0 - 34.0 pg   MCHC 31.9 30.0 - 36.0 g/dL   RDW 29.518.4 (H) 62.111.5 - 30.815.5 %   Platelets 320 150 - 400 K/uL   Neutrophils Relative % 57 %   Neutro Abs 3.5 1.7 - 7.7 K/uL   Lymphocytes Relative 38 %   Lymphs Abs 2.2 0.7 - 4.0 K/uL   Monocytes Relative 3 %   Monocytes Absolute 0.2 0.1 - 1.0 K/uL   Eosinophils Relative 1 %   Eosinophils Absolute 0.0 0.0 - 0.7 K/uL   Basophils Relative 1 %   Basophils Absolute 0.0 0.0 - 0.1 K/uL  Wet prep, genital     Status: Abnormal   Collection Time: 04/12/17  4:00 PM  Result Value Ref Range   Yeast Wet Prep HPF POC NONE SEEN NONE SEEN   Trich, Wet Prep NONE SEEN  NONE SEEN   Clue Cells Wet Prep HPF POC PRESENT (A) NONE SEEN   WBC, Wet Prep HPF POC FEW (A) NONE SEEN    Comment: MANY BACTERIA SEEN   Sperm NONE SEEN     MAU COURSE Orders Placed This Encounter  Procedures  . Wet prep, genital  . Urine Culture  . Urinalysis, Routine w reflex microscopic  . hCG, quantitative, pregnancy  . CBC with Differential/Platelet  . RPR  . Hepatitis B surface antigen  . HIV antibody  . Pregnancy, urine POC  . Discharge patient   Meds ordered this encounter  Medications  . metroNIDAZOLE (FLAGYL) 500 MG tablet    Sig: Take 1 tablet (500 mg total) by mouth 2 (two) times daily.    Dispense:  14 tablet    Refill:  0    Order Specific Question:   Supervising Provider    Answer:   Adam PhenixARNOLD, JAMES G [[id number]]    MDM - Abd pain w. Pos home UPT, but neg UPT and Quant HCG in MAU, ruling out pregnancy. UA nitrite pos, but no specific urinary complaints. Will culture. Malodorous discharge and Wet prep pos clue. Pain possible due to BV, ITU and/or dysmenorrhea if menses are about to start. Will Tx for BV w/ Flagyl and await urine culture results and Tx accordingly. IBU PRN. No fever or leukocytosis. Abd and pelvic exams benign. No evidence of PID. pyelo or other emergent conditions.   ASSESSMENT 1. Pregnancy examination or test, negative result   2. BV (bacterial vaginosis)   3. Possible exposure to STD   4. Encounter for HIV (human immunodeficiency virus) test    PLAN Discharge home in stable condition. GC/Chlamydia, HIV, Hep B, RPR, urine culture pending. Recommend condoms.   Follow-up Information    Gynecologist Follow up.   Why:  as needed          Allergies as of 04/12/2017   No Known Allergies     Medication List    TAKE these medications   metroNIDAZOLE 500 MG tablet Commonly known as:  FLAGYL Take 1 tablet (500 mg total) by mouth 2 (two) times daily.       Katrinka BlazingSmith, [place], [place] 04/12/2017 5:13 PM

## 2017-04-12 NOTE — MAU Note (Signed)
Pt reports she took several pregnancy test at home 3 came back neg 3 came back positive. C/O abd pain/cramping that comes and goes as well.

## 2017-04-12 NOTE — Discharge Instructions (Signed)
Sexually Transmitted Disease A sexually transmitted disease (STD) is a disease or infection that may be passed (transmitted) from person to person, usually during sexual activity. This may happen by way of saliva, semen, blood, vaginal mucus, or urine. Common STDs include:  Gonorrhea.  Chlamydia.  Syphilis.  HIV and AIDS.  Genital herpes.  Hepatitis B and C.  Trichomonas.  Human papillomavirus (HPV).  Pubic lice.  Scabies.  Mites.  Bacterial vaginosis.  What are the causes? An STD may be caused by bacteria, a virus, or parasites. STDs are often transmitted during sexual activity if one person is infected. However, they may also be transmitted through nonsexual means. STDs may be transmitted after:  Sexual intercourse with an infected person.  Sharing sex toys with an infected person.  Sharing needles with an infected person or using unclean piercing or tattoo needles.  Having intimate contact with the genitals, mouth, or rectal areas of an infected person.  Exposure to infected fluids during birth.  What are the signs or symptoms? Different STDs have different symptoms. Some people may not have any symptoms. If symptoms are present, they may include:  Painful or bloody urination.  Pain in the pelvis, abdomen, vagina, anus, throat, or eyes.  A skin rash, itching, or irritation.  Growths, ulcerations, blisters, or sores in the genital and anal areas.  Abnormal vaginal discharge with or without bad odor.  Penile discharge in men.  Fever.  Pain or bleeding during sexual intercourse.  Swollen glands in the groin area.  Yellow skin and eyes (jaundice). This is seen with hepatitis.  Swollen testicles.  Infertility.  Sores and blisters in the mouth.  How is this diagnosed? To make a diagnosis, your health care provider may:  Take a medical history.  Perform a physical exam.  Take a sample of any discharge to examine.  Swab the throat, cervix,  opening to the penis, rectum, or vagina for testing.  Test a sample of your first morning urine.  Perform blood tests.  Perform a Pap test, if this applies.  Perform a colposcopy.  Perform a laparoscopy.  How is this treated? Treatment depends on the STD. Some STDs may be treated but not cured.  Chlamydia, gonorrhea, trichomonas, and syphilis can be cured with antibiotic medicine.  Genital herpes, hepatitis, and HIV can be treated, but not cured, with prescribed medicines. The medicines lessen symptoms.  Genital warts from HPV can be treated with medicine or by freezing, burning (electrocautery), or surgery. Warts may come back.  HPV cannot be cured with medicine or surgery. However, abnormal areas may be removed from the cervix, vagina, or vulva.  If your diagnosis is confirmed, your recent sexual partners need treatment. This is true even if they are symptom-free or have a negative culture or evaluation. They should not have sex until their health care providers say it is okay.  Your health care provider may test you for infection again 3 months after treatment.  How is this prevented? Take these steps to reduce your risk of getting an STD:  Use latex condoms, dental dams, and water-soluble lubricants during sexual activity. Do not use petroleum jelly or oils.  Avoid having multiple sex partners.  Do not have sex with someone who has other sex partners.  Do not have sex with anyone you do not know or who is at high risk for an STD.  Avoid risky sex practices that can break your skin.  Do not have sex if you have open sores  on your mouth or skin.  Avoid drinking too much alcohol or taking illegal drugs. Alcohol and drugs can affect your judgment and put you in a vulnerable position.  Avoid engaging in oral and anal sex acts.  Get vaccinated for HPV and hepatitis. If you have not received these vaccines in the past, talk to your health care provider about whether one or  both might be right for you.  If you are at risk of being infected with HIV, it is recommended that you take a prescription medicine daily to prevent HIV infection. This is called pre-exposure prophylaxis (PrEP). You are considered at risk if: ? You are a man who has sex with other men (MSM). ? You are a heterosexual man or woman and are sexually active with more than one partner. ? You take drugs by injection. ? You are sexually active with a partner who has HIV.  Talk with your health care provider about whether you are at high risk of being infected with HIV. If you choose to begin PrEP, you should first be tested for HIV. You should then be tested every 3 months for as long as you are taking PrEP.  Contact a health care provider if:  See your health care provider.  Tell your sexual partner(s). They should be tested and treated for any STDs.  Do not have sex until your health care provider says it is okay. Get help right away if: Contact your health care provider right away if:  You have severe abdominal pain.  You are a man and notice swelling or pain in your testicles.  You are a woman and notice swelling or pain in your vagina.  This information is not intended to replace advice given to you by your health care provider. Make sure you discuss any questions you have with your health care provider. Document Released: 06/20/2002 Document Revised: 10/18/2015 Document Reviewed: 10/18/2012 Elsevier Interactive Patient Education  2018 [employer].   Bacterial Vaginosis Bacterial vaginosis is a vaginal infection that occurs when the normal balance of bacteria in the vagina is disrupted. It results from an overgrowth of certain bacteria. This is the most common vaginal infection among women ages 43-44. Because bacterial vaginosis increases your risk for STIs (sexually transmitted infections), getting treated can help reduce your risk for chlamydia, gonorrhea, herpes, and HIV (human  immunodeficiency virus). Treatment is also important for preventing complications in pregnant women, because this condition can cause an early (premature) delivery. What are the causes? This condition is caused by an increase in harmful bacteria that are normally present in small amounts in the vagina. However, the reason that the condition develops is not fully understood. What increases the risk? The following factors may make you more likely to develop this condition:  Having a new sexual partner or multiple sexual partners.  Having unprotected sex.  Douching.  Having an intrauterine device (IUD).  Smoking.  Drug and alcohol abuse.  Taking certain antibiotic medicines.  Being pregnant.  You cannot get bacterial vaginosis from toilet seats, bedding, swimming pools, or contact with objects around you. What are the signs or symptoms? Symptoms of this condition include:  Grey or white vaginal discharge. The discharge can also be watery or foamy.  A fish-like odor with discharge, especially after sexual intercourse or during menstruation.  Itching in and around the vagina.  Burning or pain with urination.  Some women with bacterial vaginosis have no signs or symptoms. How is this diagnosed? This condition is  diagnosed based on:  Your medical history.  A physical exam of the vagina.  Testing a sample of vaginal fluid under a microscope to look for a large amount of bad bacteria or abnormal cells. Your health care provider may use a cotton swab or a small wooden spatula to collect the sample.  How is this treated? This condition is treated with antibiotics. These may be given as a pill, a vaginal cream, or a medicine that is put into the vagina (suppository). If the condition comes back after treatment, a second round of antibiotics may be needed. Follow these instructions at home: Medicines  Take over-the-counter and prescription medicines only as told by your health  care provider.  Take or use your antibiotic as told by your health care provider. Do not stop taking or using the antibiotic even if you start to feel better. General instructions  If you have a female sexual partner, tell her that you have a vaginal infection. She should see her health care provider and be treated if she has symptoms. If you have a female sexual partner, he does not need treatment.  During treatment: ? Avoid sexual activity until you finish treatment. ? Do not douche. ? Avoid alcohol as directed by your health care provider. ? Avoid breastfeeding as directed by your health care provider.  Drink enough water and fluids to keep your urine clear or pale yellow.  Keep the area around your vagina and rectum clean. ? Wash the area daily with warm water. ? Wipe yourself from front to back after using the toilet.  Keep all follow-up visits as told by your health care provider. This is important. How is this prevented?  Do not douche.  Wash the outside of your vagina with warm water only.  Use protection when having sex. This includes latex condoms and dental dams.  Limit how many sexual partners you have. To help prevent bacterial vaginosis, it is best to have sex with just one partner (monogamous).  Make sure you and your sexual partner are tested for STIs.  Wear cotton or cotton-lined underwear.  Avoid wearing tight pants and pantyhose, especially during summer.  Limit the amount of alcohol that you drink.  Do not use any products that contain nicotine or tobacco, such as cigarettes and e-cigarettes. If you need help quitting, ask your health care provider.  Do not use illegal drugs. Where to find more information:  Centers for Disease Control and Prevention: SolutionApps.co.za  American Sexual Health Association (ASHA): www.ashastd.org  U.S. Department of Health and Health and safety inspector, Office on Women's Health: ConventionalMedicines.si or  http://www.anderson-williamson.info/ Contact a health care provider if:  Your symptoms do not improve, even after treatment.  You have more discharge or pain when urinating.  You have a fever.  You have pain in your abdomen.  You have pain during sex.  You have vaginal bleeding between periods. Summary  Bacterial vaginosis is a vaginal infection that occurs when the normal balance of bacteria in the vagina is disrupted.  Because bacterial vaginosis increases your risk for STIs (sexually transmitted infections), getting treated can help reduce your risk for chlamydia, gonorrhea, herpes, and HIV (human immunodeficiency virus). Treatment is also important for preventing complications in pregnant women, because the condition can cause an early (premature) delivery.  This condition is treated with antibiotic medicines. These may be given as a pill, a vaginal cream, or a medicine that is put into the vagina (suppository). This information is not intended to replace advice  given to you by your health care provider. Make sure you discuss any questions you have with your health care provider. Document Released: 03/30/2005 Document Revised: 08/03/2016 Document Reviewed: 12/14/2015 Elsevier Interactive Patient Education  [employer]2018 Elsevier Inc.  In late 2019, [facility] will be moving to the [facility]. At that time, the MAU (Maternity Admissions Unit), where you are being seen today, will no longer take care of non-pregnant patients. We strongly encourage you to find a doctor's office before that time, so that you can be seen with any GYN concerns, like vaginal discharge, urinary tract infection, etc.. in a timely manner.  In order to make an office visit more convenient, the [facility] will be offering evening hours with same-day appointments, walk-in appointments and scheduled appointments available during this  time.  Center [facility] @ [facility] Hours: Monday - 8am - 7:30 pm with walk-in between 4pm- 7:30 pm Tuesday - 8 am - 5 pm (starting 07/13/17 we will be open late and accepting walk-ins from 4pm - 7:30pm) Wednesday - 8 am - 5 pm (starting 10/13/17 we will be open late and accepting walk-ins from 4pm - 7:30pm) Thursday 8 am - 5 pm (starting 01/13/18 we will be open late and accepting walk-ins from 4pm - 7:30pm) Friday 8 am - 5 pm  For an appointment please call the [facility] at [phone]  For urgent needs, [facility] is also available for management of urgent GYN complaints such as vaginal discharge or urinary tract infections.

## 2017-04-13 LAB — RPR: RPR: NONREACTIVE

## 2017-04-13 LAB — HIV ANTIBODY (ROUTINE TESTING W REFLEX): HIV SCREEN 4TH GENERATION: NONREACTIVE

## 2017-04-14 LAB — GC/CHLAMYDIA PROBE AMP (~~LOC~~) NOT AT ARMC
CHLAMYDIA, DNA PROBE: POSITIVE — AB
Neisseria Gonorrhea: NEGATIVE

## 2017-04-15 ENCOUNTER — Other Ambulatory Visit: Payer: Self-pay | Admitting: Advanced Practice Midwife

## 2017-04-15 DIAGNOSIS — N3000 Acute cystitis without hematuria: Secondary | ICD-10-CM

## 2017-04-15 LAB — URINE CULTURE: SPECIAL REQUESTS: NORMAL

## 2017-04-15 MED ORDER — SULFAMETHOXAZOLE-TRIMETHOPRIM 800-160 MG PO TABS: 1.0000 | ORAL_TABLET | Freq: Two times a day (BID) | ORAL | 1 refills | Status: DC

## 2017-04-15 NOTE — Progress Notes (Signed)
Dx UTI. Rx Bactrim DS x 7 days.

## 2017-04-16 ENCOUNTER — Telehealth: Payer: Self-pay | Admitting: Student

## 2017-04-16 DIAGNOSIS — A749 Chlamydial infection, unspecified: Secondary | ICD-10-CM

## 2017-04-16 MED ORDER — AZITHROMYCIN 500 MG PO TABS: 1000.0000 mg | ORAL_TABLET | Freq: Once | ORAL | 0 refills | Status: AC

## 2017-04-16 NOTE — Telephone Encounter (Addendum)
Victoria Cline tested positive for  Chlamydia. Patient was called by RN and allergies and pharmacy confirmed. Rx sent to pharmacy of choice.   Judeth HornLawrence, Smaran Gaus, NP 04/16/2017 5:03 PM        ----- Message from Kathe BectonLori S Berdik, RN sent at 04/16/2017  2:28 PM EST ----- This patient tested positive for:  chlamydia  She :"has NKDA" I have informed the patient of her results and confirmed her pharmacy is correct in her chart. Please send Rx.   Thank you,   Kathe BectonBerdik, Lori S, RN   Results faxed to [facility].

## 2017-09-04 ENCOUNTER — Ambulatory Visit (HOSPITAL_COMMUNITY): Admission: EM | Admit: 2017-09-04 | Discharge: 2017-09-04 | Discharge status: Against Medical Advice | Payer: Self-pay

## 2017-09-21 ENCOUNTER — Encounter (HOSPITAL_COMMUNITY): Payer: Self-pay | Admitting: Family Medicine

## 2017-09-21 ENCOUNTER — Ambulatory Visit (HOSPITAL_COMMUNITY)
Admission: EM | Admit: 2017-09-21 | Discharge: 2017-09-21 | Disposition: A | Discharge status: Home / Self Care | Payer: Self-pay | Attending: Family Medicine | Admitting: Family Medicine

## 2017-09-21 DIAGNOSIS — F419 Anxiety disorder, unspecified: Secondary | ICD-10-CM | POA: Insufficient documentation

## 2017-09-21 DIAGNOSIS — F17210 Nicotine dependence, cigarettes, uncomplicated: Secondary | ICD-10-CM | POA: Insufficient documentation

## 2017-09-21 DIAGNOSIS — J45909 Unspecified asthma, uncomplicated: Secondary | ICD-10-CM | POA: Insufficient documentation

## 2017-09-21 DIAGNOSIS — N760 Acute vaginitis: Secondary | ICD-10-CM | POA: Insufficient documentation

## 2017-09-21 LAB — POCT URINALYSIS DIP (DEVICE)
BILIRUBIN URINE: NEGATIVE
Glucose, UA: NEGATIVE mg/dL
KETONES UR: NEGATIVE mg/dL
Nitrite: POSITIVE — AB
PH: 6 (ref 5.0–8.0)
PROTEIN: NEGATIVE mg/dL
SPECIFIC GRAVITY, URINE: 1.025 (ref 1.005–1.030)
Urobilinogen, UA: 0.2 mg/dL (ref 0.0–1.0)

## 2017-09-21 LAB — POCT PREGNANCY, URINE: Preg Test, Ur: NEGATIVE

## 2017-09-21 MED ORDER — METRONIDAZOLE 500 MG PO TABS: 500.0000 mg | ORAL_TABLET | Freq: Two times a day (BID) | ORAL | 0 refills | Status: DC

## 2017-09-21 NOTE — ED Provider Notes (Addendum)
[facility]    CSN: [id number] Arrival date & time: 09/21/17  1002     History   Chief Complaint Chief Complaint  Patient presents with  . Vaginitis    HPI Victoria Cline is a 27 y.o. female.   HPI  Patient is here complaining of vaginal infection.  She has vaginal discharge and slight odor.  Mild vaginal irritation.  Mild burning with urination.  Mild lower abdominal cramping pain.  No fever chills.  No nausea or vomiting.  No known exposure to STD.  Stable relationship for 2 years.  No flank pain.  No fever or chills.  Past Medical History:  Diagnosis Date  . Anxiety   . Asthma   . Fracture, pelvis closed (HCC)   . Pneumothorax     Patient Active Problem List   Diagnosis Date Noted  . Exposure to STD 01/31/2012  . Asthma 03/31/2011  . Mood disorder (HCC) 03/31/2011    Past Surgical History:  Procedure Laterality Date  . NO PAST SURGERIES      OB History    Gravida  2   Para      Term      Preterm      AB  2   Living        SAB  2   TAB      Ectopic      Multiple      Live Births           Obstetric Comments  Early SABs x 2 as teenager         Home Medications    Prior to Admission medications   Medication Sig Start Date End Date Taking? Authorizing Provider  metroNIDAZOLE (FLAGYL) 500 MG tablet Take 1 tablet (500 mg total) by mouth 2 (two) times daily. 09/21/17   Eustace Moore, MD    Family History Family History  Problem Relation Age of Onset  . Cancer Mother        Living, brain tumor    Social History Social History   Tobacco Use  . Smoking status: Current Every Day Smoker    Packs/day: 0.50    Years: 5.00    Pack years: 2.50    Types: Cigarettes  . Smokeless tobacco: Never Used  Substance Use Topics  . Alcohol use: No  . Drug use: No     Allergies   Patient has no known allergies.   Review of Systems Review of Systems  Constitutional: Negative for chills and fever.  HENT:  Negative for ear pain and sore throat.   Eyes: Negative for pain and visual disturbance.  Respiratory: Negative for cough and shortness of breath.   Cardiovascular: Negative for chest pain and palpitations.  Gastrointestinal: Positive for abdominal pain. Negative for vomiting.  Genitourinary: Positive for dysuria and vaginal discharge. Negative for hematuria.  Musculoskeletal: Negative for arthralgias and back pain.  Skin: Negative for color change and rash.  Neurological: Negative for seizures and syncope.  All other systems reviewed and are negative.    Physical Exam Triage Vital Signs ED Triage Vitals  Enc Vitals Group     BP 09/21/17 1022 124/70     Pulse Rate 09/21/17 1022 60     Resp 09/21/17 1022 18     Temp 09/21/17 1022 98.3 F (36.8 C)     Temp src --      SpO2 09/21/17 1022 99 %     Weight --  Height --      Head Circumference --      Peak Flow --      Pain Score 09/21/17 1020 5     Pain Loc --      Pain Edu? --      Excl. in GC? --    No data found.  Updated Vital Signs BP 124/70   Pulse 60   Temp 98.3 F (36.8 C)   Resp 18   LMP 09/01/2017   SpO2 99%   Visual Acuity Right Eye Distance:   Left Eye Distance:   Bilateral Distance:    Right Eye Near:   Left Eye Near:    Bilateral Near:     Physical Exam  Constitutional: She appears well-developed and well-nourished. No distress.  HENT:  Head: Normocephalic and atraumatic.  Mouth/Throat: Oropharynx is clear and moist.  Eyes: Pupils are equal, round, and reactive to light. Conjunctivae are normal.  Neck: Normal range of motion.  Cardiovascular: Normal rate, regular rhythm and normal heart sounds.  Pulmonary/Chest: Effort normal. No respiratory distress. She has no wheezes.  Abdominal: Soft. Bowel sounds are normal. She exhibits no distension. There is no tenderness.  No tenderness, guarding or rebound to abdominal palpation  Musculoskeletal: Normal range of motion. She exhibits no edema.    Neurological: She is alert.  Skin: Skin is warm and dry.     UC Treatments / Results  Labs (all labs ordered are listed, but only abnormal results are displayed) Labs Reviewed  POCT URINALYSIS DIP (DEVICE) - Abnormal; Notable for the following components:      Result Value   Hgb urine dipstick TRACE (*)    Nitrite POSITIVE (*)    Leukocytes, UA TRACE (*)    All other components within normal limits  URINE CULTURE  POCT PREGNANCY, URINE  URINE CYTOLOGY ANCILLARY ONLY    EKG None  Radiology No results found.  Procedures Procedures (including critical care time)  Medications Ordered in UC Medications - No data to display  Initial Impression / Assessment and Plan / UC Course  I have reviewed the triage vital signs and the nursing notes.  Pertinent labs & imaging results that were available during my care of the patient were reviewed by me and considered in my medical decision making (see chart for details).     Discussed urinary tract infection versus vaginitis.  Symptoms sound more like a vaginitis.  I am going to treat her for BV.  We discussed that I am going to test her with a urine culture and additional cytology to rule out STD.  Patient is agreeable. Final Clinical Impressions(s) / UC Diagnoses   Final diagnoses:  Acute vaginitis     Discharge Instructions     We did lab testing during this visit.  If there are any abnormal findings that require change in medicine or indicate a positive result, you will be notified.  If all of your tests are normal, you will not be called.   TAKE THE ANTIBIOTIC FOR 7 DAYS AVOID SEXUAL RELATIONS OR USE PROTECTION UNTIL ANTIBIOTICS COMPLETE TO PREVENT RECURRENCE DRINK PLENTY OF WATER      ED Prescriptions    Medication Sig Dispense Auth. Provider   metroNIDAZOLE (FLAGYL) 500 MG tablet Take 1 tablet (500 mg total) by mouth 2 (two) times daily. 14 tablet Eustace Moore, MD     Controlled Substance Prescriptions [place]  Controlled Substance Registry consulted? Not Applicable   Eustace Moore, MD 09/21/17  1140    Eustace MooreNelson, Zurisadai Helminiak Sue, MD 09/21/17 1140

## 2017-09-21 NOTE — Discharge Instructions (Addendum)
We did lab testing during this visit.  If there are any abnormal findings that require change in medicine or indicate a positive result, you will be notified.  If all of your tests are normal, you will not be called.   TAKE THE ANTIBIOTIC FOR 7 DAYS AVOID SEXUAL RELATIONS OR USE PROTECTION UNTIL ANTIBIOTICS COMPLETE TO PREVENT RECURRENCE DRINK PLENTY OF WATER

## 2017-09-21 NOTE — ED Triage Notes (Signed)
Pt here for lower pelvic pain, vaginal itching, burning and discharge.

## 2017-09-22 ENCOUNTER — Ambulatory Visit (HOSPITAL_COMMUNITY)
Admission: EM | Admit: 2017-09-22 | Discharge: 2017-09-22 | Disposition: A | Discharge status: Home / Self Care | Payer: Self-pay | Attending: Family Medicine | Admitting: Family Medicine

## 2017-09-22 ENCOUNTER — Encounter (HOSPITAL_COMMUNITY): Payer: Self-pay | Admitting: Emergency Medicine

## 2017-09-22 ENCOUNTER — Other Ambulatory Visit: Payer: Self-pay

## 2017-09-22 DIAGNOSIS — N939 Abnormal uterine and vaginal bleeding, unspecified: Secondary | ICD-10-CM

## 2017-09-22 NOTE — Discharge Instructions (Signed)
As discussed, flagyl should not be causing the vaginal bleeding. Monitor to see if this is a cycle. Please make an appointment with GYN for further evaluation. Your testings ares still running. If experiencing worsening bleeding prior to appointment with GYN, follow up for reevaluation needed.

## 2017-09-22 NOTE — ED Provider Notes (Signed)
[facility]    CSN: [id number] Arrival date & time: 09/22/17  1128     History   Chief Complaint Chief Complaint  Patient presents with  . Vaginal Bleeding    HPI Victoria Cline is a 27 y.o. female.   27 year old female comes in with 2 day history of vaginal bleeding/spotting. States started flagyl yesterday and wonders if that could cause it. States started out as spotting, and now with bleeding and needs to use pads. States needs to switch pads every few hours. Still having some intermittent low abdominal cramping that has increased in frequency and intensity. Denies nausea/ vomiting. A little drowsy without weakness, dizziness, syncope. She has taken flagyl in the past without problems. Has history of irregular cycles, but states for the most part it is regular.      Past Medical History:  Diagnosis Date  . Anxiety   . Asthma   . Fracture, pelvis closed (HCC)   . Pneumothorax     Patient Active Problem List   Diagnosis Date Noted  . Exposure to STD 01/31/2012  . Asthma 03/31/2011  . Mood disorder (HCC) 03/31/2011    Past Surgical History:  Procedure Laterality Date  . NO PAST SURGERIES      OB History    Gravida  2   Para      Term      Preterm      AB  2   Living        SAB  2   TAB      Ectopic      Multiple      Live Births           Obstetric Comments  Early SABs x 2 as teenager         Home Medications    Prior to Admission medications   Medication Sig Start Date End Date Taking? Authorizing Provider  metroNIDAZOLE (FLAGYL) 500 MG tablet Take 1 tablet (500 mg total) by mouth 2 (two) times daily. 09/21/17   Eustace MooreNelson, Yvonne Sue, MD    Family History Family History  Problem Relation Age of Onset  . Cancer Mother        Living, brain tumor    Social History Social History   Tobacco Use  . Smoking status: Current Every Day Smoker    Packs/day: 0.50    Years: 5.00    Pack years: 2.50    Types:  Cigarettes  . Smokeless tobacco: Never Used  Substance Use Topics  . Alcohol use: No  . Drug use: No     Allergies   Patient has no known allergies.   Review of Systems Review of Systems  Reason unable to perform ROS: See HPI as above.   Physical Exam Triage Vital Signs ED Triage Vitals  Enc Vitals Group     BP 09/22/17 1232 (!) 98/56     Pulse Rate 09/22/17 1232 76     Resp 09/22/17 1232 18     Temp 09/22/17 1232 98.6 F (37 C)     Temp Source 09/22/17 1232 Oral     SpO2 09/22/17 1232 100 %     Weight --      Height --      Head Circumference --      Peak Flow --      Pain Score 09/22/17 1230 7     Pain Loc --      Pain Edu? --  Excl. in GC? --    No data found.  Updated Vital Signs BP (!) 98/56 (BP Location: Left Arm)   Pulse 76   Temp 98.6 F (37 C) (Oral)   Resp 18   LMP 09/01/2017   SpO2 100%   Physical Exam  Constitutional: She is oriented to person, place, and time. She appears well-developed and well-nourished. No distress.  HENT:  Head: Normocephalic and atraumatic.  Eyes: Pupils are equal, round, and reactive to light. Conjunctivae are normal.  Cardiovascular: Normal rate, regular rhythm and normal heart sounds. Exam reveals no gallop and no friction rub.  No murmur heard. Pulmonary/Chest: Effort normal and breath sounds normal. No stridor. No respiratory distress. She has no wheezes. She has no rales.  Abdominal: Soft. Bowel sounds are normal. She exhibits no mass. There is no tenderness. There is no rebound, no guarding and no CVA tenderness.  Neurological: She is alert and oriented to person, place, and time.  Skin: Skin is warm and dry.  Psychiatric: She has a normal mood and affect. Her behavior is normal. Judgment normal.   UC Treatments / Results  Labs (all labs ordered are listed, but only abnormal results are displayed) Labs Reviewed - No data to display  EKG None  Radiology No results found.  Procedures Procedures  (including critical care time)  Medications Ordered in UC Medications - No data to display  Initial Impression / Assessment and Plan / UC Course  I have reviewed the triage vital signs and the nursing notes.  Pertinent labs & imaging results that were available during my care of the patient were reviewed by me and considered in my medical decision making (see chart for details).    No alarming signs on exam. Patient without tachycardia. Nontoxic in appearance, able to ambulate on own without problems. Pregnancy test yesterday negative. Discussed flagyl less likely the cause of vaginal bleeding. Discussed possible early cycle given history of irregular cycles. Will have patient monitor for now and follow up with GYN for further evaluation of abnormal uterine bleeding. Return precautions given. Patient expresses understanding and agrees to plan.  Final Clinical Impressions(s) / UC Diagnoses   Final diagnoses:  Vaginal bleeding    ED Prescriptions    None        Belinda Fisher, PA-C 09/22/17 1318

## 2017-09-22 NOTE — ED Triage Notes (Signed)
Patient reports vaginal bleeding started after taking flagyl yesterday.

## 2017-09-23 LAB — URINE CULTURE
Culture: >=100000 — AB
Special Requests: NORMAL

## 2017-09-23 LAB — URINE CYTOLOGY ANCILLARY ONLY
Chlamydia: NEGATIVE
Neisseria Gonorrhea: POSITIVE — AB
TRICH (WINDOWPATH): NEGATIVE

## 2017-09-24 ENCOUNTER — Ambulatory Visit (HOSPITAL_COMMUNITY)
Admission: EM | Admit: 2017-09-24 | Discharge: 2017-09-24 | Disposition: A | Discharge status: Home / Self Care | Payer: Self-pay | Attending: Internal Medicine | Admitting: Internal Medicine

## 2017-09-24 ENCOUNTER — Telehealth (HOSPITAL_COMMUNITY): Payer: Self-pay

## 2017-09-24 LAB — URINE CYTOLOGY ANCILLARY ONLY: CANDIDA VAGINITIS: NEGATIVE

## 2017-09-24 MED ORDER — CEFTRIAXONE SODIUM 250 MG IJ SOLR
250.0000 mg | Freq: Once | INTRAMUSCULAR | Status: AC
  Administered 2017-09-24: 250 mg via INTRAMUSCULAR

## 2017-09-24 MED ORDER — CEFTRIAXONE SODIUM 250 MG IJ SOLR
INTRAMUSCULAR | Status: AC
  Filled 2017-09-24: qty 250

## 2017-09-24 MED ORDER — CEPHALEXIN 500 MG PO CAPS: 500.0000 mg | ORAL_CAPSULE | Freq: Two times a day (BID) | ORAL | 0 refills | Status: AC

## 2017-09-24 NOTE — ED Notes (Signed)
Bed: UC01 Expected date:  Expected time:  Means of arrival:  Comments: 

## 2017-09-24 NOTE — ED Triage Notes (Signed)
Pt here for injection for gonorrhea.

## 2017-09-24 NOTE — Telephone Encounter (Signed)
Urine culture was positive for E.Coli this was not treated at urgent care visit. Prescription for Keflex 500 mg BID x 5 days per Dr. Dayton ScrapeMurray sent to pharmacy of choice. Pt called and made aware. Pt educated to follow up if symptoms are not improving. Verbalized understanding.   Bacterial Vaginosis test is positive.  Prescription for metronidazole was given at the urgent care visit. Pt contacted regarding results. Answered all questions. Verbalized understanding.  Gonorrhea is positive.  Patient should return as soon as possible to the urgent care for treatment with IM rocephin 250mg  and po zithromax 1g. Patient will not need to see a provider unless there are new symptoms she would like evaluated. Pt called and made aware, educated patient to refrain from sexual intercourse for now and for 7 days after treatment to give the medicine time to work. Sexual partners need to be notified and tested/treated. Condoms may reduce risk of reinfection. Answered all patient questions. [facility] notified.

## 2017-12-07 ENCOUNTER — Encounter (HOSPITAL_COMMUNITY): Payer: Self-pay | Admitting: Emergency Medicine

## 2017-12-07 ENCOUNTER — Other Ambulatory Visit: Payer: Self-pay

## 2017-12-07 ENCOUNTER — Emergency Department (HOSPITAL_COMMUNITY)
Admission: EM | Admit: 2017-12-07 | Discharge: 2017-12-08 | Disposition: A | Discharge status: Home / Self Care | Payer: Self-pay | Attending: Emergency Medicine | Admitting: Emergency Medicine

## 2017-12-07 DIAGNOSIS — N760 Acute vaginitis: Secondary | ICD-10-CM | POA: Insufficient documentation

## 2017-12-07 DIAGNOSIS — F17210 Nicotine dependence, cigarettes, uncomplicated: Secondary | ICD-10-CM | POA: Insufficient documentation

## 2017-12-07 DIAGNOSIS — J45909 Unspecified asthma, uncomplicated: Secondary | ICD-10-CM | POA: Insufficient documentation

## 2017-12-07 DIAGNOSIS — B9689 Other specified bacterial agents as the cause of diseases classified elsewhere: Secondary | ICD-10-CM

## 2017-12-07 LAB — URINALYSIS, ROUTINE W REFLEX MICROSCOPIC
Bilirubin Urine: NEGATIVE
Glucose, UA: NEGATIVE mg/dL
Hgb urine dipstick: NEGATIVE
Ketones, ur: NEGATIVE mg/dL
Nitrite: NEGATIVE
Protein, ur: NEGATIVE mg/dL
Specific Gravity, Urine: 1.024 (ref 1.005–1.030)
pH: 7 (ref 5.0–8.0)

## 2017-12-07 LAB — I-STAT BETA HCG BLOOD, ED (MC, WL, AP ONLY): I-stat hCG, quantitative: <5 m[IU]/mL (ref <5)

## 2017-12-07 NOTE — ED Notes (Signed)
Pt came to desk complaining about wait time. She accused me of copping an attitude because I told her that only a Dr or PA could give her the results of her test. She wanted Charge to come and give her the results and I tried to explain the procedure to her. Kaitllynn called [employer] and she came to speak to the pt.

## 2017-12-07 NOTE — ED Triage Notes (Signed)
Pt reports pelvic pain and vaginal itching that started a few days ago. Pt reports vaginal discharge. Denies any vaginal bleeding. Pt reports tingling when urinating and nausea as well.

## 2017-12-07 NOTE — ED Provider Notes (Signed)
Patient placed in Quick Look pathway, seen and evaluated   Chief Complaint: Pelvic pain  HPI:  Pelvic pain x3 days, vaginal itching and increased discharge. Constant suprapubic pain radiates to the right. Notes nausea. Worsens with movement. Notes tingling with urination, urinary urgency and frequency Denies vomiting, fevers, diarrhea. One female partner in the past 6 months, states she always uses protection.   ROS: positive for abdominal pain, vaginal itching and discharge, urinary symptoms, nausea Negative for fevers, vomiting  Physical Exam:   Gen: No distress  Neuro: Awake and Alert  Skin: Warm    Focused Exam: Active bowel sounds, abdomen soft, mildly tender to palpation in the right lower quadrant, suprapubic region, and left lower quadrant.  No rebound or guarding.  No CVA tenderness.   Initiation of care has begun. The patient has been counseled on the process, plan, and necessity for staying for the completion/evaluation, and the remainder of the medical screening examination    Bennye AlmFawze, Brantley Wiley A, PA-C 12/07/17 2003    Charlynne PanderYao, David Hsienta, MD 12/07/17 2330

## 2017-12-08 LAB — RPR: RPR Ser Ql: NONREACTIVE

## 2017-12-08 LAB — WET PREP, GENITAL
Sperm: NONE SEEN
Trich, Wet Prep: NONE SEEN
Yeast Wet Prep HPF POC: NONE SEEN

## 2017-12-08 LAB — HIV ANTIBODY (ROUTINE TESTING W REFLEX): HIV Screen 4th Generation wRfx: NONREACTIVE

## 2017-12-08 MED ORDER — METRONIDAZOLE 500 MG PO TABS: 500.0000 mg | ORAL_TABLET | Freq: Two times a day (BID) | ORAL | 0 refills | Status: DC

## 2017-12-08 MED ORDER — LIDOCAINE HCL (PF) 1 % IJ SOLN
INTRAMUSCULAR | Status: AC
  Administered 2017-12-08: 2.1 mL
  Filled 2017-12-08: qty 5

## 2017-12-08 MED ORDER — CEFTRIAXONE SODIUM 250 MG IJ SOLR
250.0000 mg | Freq: Once | INTRAMUSCULAR | Status: AC
  Administered 2017-12-08: 250 mg via INTRAMUSCULAR
  Filled 2017-12-08: qty 250

## 2017-12-08 MED ORDER — AZITHROMYCIN 250 MG PO TABS
1000.0000 mg | ORAL_TABLET | Freq: Once | ORAL | Status: AC
  Administered 2017-12-08: 1000 mg via ORAL
  Filled 2017-12-08: qty 4

## 2017-12-08 NOTE — ED Notes (Signed)
Pt requesting to speak with me regarding wait time and wanting test results; I explained to her that only an MD or PA could review her results with her and that she still needed to be examined by a provider also; pt stated "so what do I need to do to get a room faster, tell you Im suicidal?!" I told her that the department was full and confirmed that the patient is not actually suicidal; pt agreed to stay until her ride left

## 2017-12-08 NOTE — ED Provider Notes (Signed)
[facility] Provider Note   CSN: [id number] Arrival date & time: 12/07/17  1911     History   Chief Complaint Chief Complaint  Patient presents with  . Pelvic Pain  . Vaginal Itching    HPI Victoria Cline is a 27 y.o. female.  Patient is a 27 year old female with no significant past medical history.  She presents with complaints of pelvic pain and vaginal discharge.  This is been ongoing for the past several days and is worsening.  She reports a yellowish discharge.  She denies any bleeding.  She denies any rash or lesions.  She is sexually active, but with one partner with whom she questions the possibility he may have been unfaithful.     Past Medical History:  Diagnosis Date  . Anxiety   . Asthma   . Fracture, pelvis closed (HCC)   . Pneumothorax     Patient Active Problem List   Diagnosis Date Noted  . Exposure to STD 01/31/2012  . Asthma 03/31/2011  . Mood disorder (HCC) 03/31/2011    Past Surgical History:  Procedure Laterality Date  . NO PAST SURGERIES       OB History    Gravida  2   Para      Term      Preterm      AB  2   Living        SAB  2   TAB      Ectopic      Multiple      Live Births           Obstetric Comments  Early SABs x 2 as teenager         Home Medications    Prior to Admission medications   Medication Sig Start Date End Date Taking? Authorizing Provider  metroNIDAZOLE (FLAGYL) 500 MG tablet Take 1 tablet (500 mg total) by mouth 2 (two) times daily. 09/21/17   Eustace MooreNelson, Yvonne Sue, MD    Family History Family History  Problem Relation Age of Onset  . Cancer Mother        Living, brain tumor    Social History Social History   Tobacco Use  . Smoking status: Current Every Day Smoker    Packs/day: 0.25    Years: 5.00    Pack years: 1.25    Types: Cigarettes  . Smokeless tobacco: Never Used  Substance Use Topics  . Alcohol use: No  . Drug use: No      Allergies   Patient has no known allergies.   Review of Systems Review of Systems  All other systems reviewed and are negative.    Physical Exam Updated Vital Signs BP 120/70 (BP Location: Right Arm)   Pulse 62   Temp 98.4 F (36.9 C) (Oral)   Resp 17   Ht 5\' 3"  (1.6 m)   Wt 52.2 kg   LMP 10/20/2017   SpO2 100%   BMI 20.37 kg/m   Physical Exam  Constitutional: She is oriented to person, place, and time. She appears well-developed and well-nourished. No distress.  HENT:  Head: Normocephalic and atraumatic.  Neck: Normal range of motion. Neck supple.  Cardiovascular: Normal rate and regular rhythm. Exam reveals no gallop and no friction rub.  No murmur heard. Pulmonary/Chest: Effort normal and breath sounds normal. No respiratory distress. She has no wheezes.  Abdominal: Soft. Bowel sounds are normal. She exhibits no distension. There is tenderness. There is  no rebound and no guarding.  There is mild tenderness to palpation in the suprapubic region.  Genitourinary: Vaginal discharge found.  Genitourinary Comments: There is a slight vaginal discharge noted.  There are no other obvious lesions.  There is no cervical motion tenderness and no adnexal masses are palpable.  Musculoskeletal: Normal range of motion.  Neurological: She is alert and oriented to person, place, and time.  Skin: Skin is warm and dry. She is not diaphoretic.  Nursing note and vitals reviewed.    ED Treatments / Results  Labs (all labs ordered are listed, but only abnormal results are displayed) Labs Reviewed  URINALYSIS, ROUTINE W REFLEX MICROSCOPIC - Abnormal; Notable for the following components:      Result Value   APPearance HAZY (*)    Leukocytes, UA SMALL (*)    Bacteria, UA RARE (*)    All other components within normal limits  WET PREP, GENITAL  RPR  HIV ANTIBODY (ROUTINE TESTING)  I-STAT BETA HCG BLOOD, ED ([facility], [facility], [facility] ONLY)  GC/CHLAMYDIA PROBE AMP ([place]) NOT AT [facility]     EKG None  Radiology No results found.  Procedures Procedures (including critical care time)  Medications Ordered in ED Medications - No data to display   Initial Impression / Assessment and Plan / ED Course  I have reviewed the triage vital signs and the nursing notes.  Pertinent labs & imaging results that were available during my care of the patient were reviewed by me and considered in my medical decision making (see chart for details).  Wet prep shows clue cells, but no yeast or trichomonas.  The patient was given the option to await GC and Chlamydia cultures, however she has elected for presumptive treatment.  She was given Rocephin and Zithromax and will be discharged with Flagyl.  She is to follow-up as needed.  Final Clinical Impressions(s) / ED Diagnoses   Final diagnoses:  None    ED Discharge Orders    None       Geoffery Lyons, MD 12/08/17 [phone]

## 2017-12-08 NOTE — Discharge Instructions (Addendum)
Flagyl as prescribed.  We will call you with your cultures indicate you require further treatment or action.

## 2017-12-10 LAB — GC/CHLAMYDIA PROBE AMP (~~LOC~~) NOT AT ARMC
Chlamydia: NEGATIVE
Neisseria Gonorrhea: NEGATIVE

## 2018-03-12 ENCOUNTER — Other Ambulatory Visit: Payer: Self-pay

## 2018-03-12 ENCOUNTER — Emergency Department (HOSPITAL_COMMUNITY)
Admission: EM | Admit: 2018-03-12 | Discharge: 2018-03-12 | Disposition: A | Discharge status: Home / Self Care | Payer: Self-pay | Attending: Emergency Medicine | Admitting: Emergency Medicine

## 2018-03-12 DIAGNOSIS — F410 Panic disorder [episodic paroxysmal anxiety] without agoraphobia: Secondary | ICD-10-CM | POA: Insufficient documentation

## 2018-03-12 DIAGNOSIS — F17210 Nicotine dependence, cigarettes, uncomplicated: Secondary | ICD-10-CM | POA: Insufficient documentation

## 2018-03-12 DIAGNOSIS — J45901 Unspecified asthma with (acute) exacerbation: Secondary | ICD-10-CM | POA: Insufficient documentation

## 2018-03-12 MED ORDER — LORAZEPAM 2 MG/ML IJ SOLN
1.0000 mg | Freq: Once | INTRAMUSCULAR | Status: AC
  Administered 2018-03-12: 1 mg via INTRAVENOUS
  Filled 2018-03-12: qty 1

## 2018-03-12 MED ORDER — ALBUTEROL SULFATE HFA 108 (90 BASE) MCG/ACT IN AERS
2.0000 | INHALATION_SPRAY | RESPIRATORY_TRACT | Status: DC | PRN
  Filled 2018-03-12: qty 6.7

## 2018-03-12 MED ORDER — PREDNISONE 20 MG PO TABS: 40.0000 mg | ORAL_TABLET | Freq: Every day | ORAL | 0 refills | Status: DC

## 2018-03-12 MED ORDER — ACETAMINOPHEN 500 MG PO TABS
1000.0000 mg | ORAL_TABLET | Freq: Once | ORAL | Status: AC
  Administered 2018-03-12: 1000 mg via ORAL
  Filled 2018-03-12: qty 2

## 2018-03-12 NOTE — ED Provider Notes (Signed)
[facility]-EMERGENCY DEPT Provider Note   CSN: [id number] Arrival date & time: 03/12/18  0143     History   Chief Complaint Chief Complaint  Patient presents with  . Wheezing    HPI Victoria Cline is a 27 y.o. female.  Patient presents to the emergency department with a chief complaint of asthma exacerbation.  She reports calling EMS tonight because of wheezing and shortness of breath.  She states that she has been out of her inhaler.  When EMS arrived, they gave her 125 mg of Solu-Medrol, 10 mg of albuterol, and 0.5 mg of Atrovent.  She reports that this is improved her breathing, but she states that she is very anxious.  She states that her asthma exacerbation brought on an anxiety attack.  She has not taken anything for this.  She denies any alcohol or drug use tonight.  The history is provided by the patient. No language interpreter was used.    Past Medical History:  Diagnosis Date  . Anxiety   . Asthma   . Fracture, pelvis closed (HCC)   . Pneumothorax     Patient Active Problem List   Diagnosis Date Noted  . Exposure to STD 01/31/2012  . Asthma 03/31/2011  . Mood disorder (HCC) 03/31/2011    Past Surgical History:  Procedure Laterality Date  . NO PAST SURGERIES       OB History    Gravida  2   Para      Term      Preterm      AB  2   Living        SAB  2   TAB      Ectopic      Multiple      Live Births           Obstetric Comments  Early SABs x 2 as teenager         Home Medications    Prior to Admission medications   Medication Sig Start Date End Date Taking? Authorizing Provider  metroNIDAZOLE (FLAGYL) 500 MG tablet Take 1 tablet (500 mg total) by mouth 2 (two) times daily. Patient not taking: Reported on 12/08/2017 09/21/17   Eustace Moore, MD  metroNIDAZOLE (FLAGYL) 500 MG tablet Take 1 tablet (500 mg total) by mouth 2 (two) times daily. One po bid x 7 days 12/08/17   Geoffery Lyons, MD     Family History Family History  Problem Relation Age of Onset  . Cancer Mother        Living, brain tumor    Social History Social History   Tobacco Use  . Smoking status: Current Every Day Smoker    Packs/day: 0.25    Years: 5.00    Pack years: 1.25    Types: Cigarettes  . Smokeless tobacco: Never Used  Substance Use Topics  . Alcohol use: No  . Drug use: No     Allergies   Patient has no known allergies.   Review of Systems Review of Systems  All other systems reviewed and are negative.    Physical Exam Updated Vital Signs BP 120/78 (BP Location: Right Arm)   Pulse (!) 104   Temp 98.3 F (36.8 C) (Oral)   Resp (!) 26   Ht 5\' 3"  (1.6 m)   Wt 52.2 kg   LMP  (LMP Unknown)   SpO2 100%   BMI 20.37 kg/m   Physical Exam  Constitutional: She is oriented  to person, place, and time. She appears well-developed and well-nourished.  Tremulous  HENT:  Head: Normocephalic and atraumatic.  Eyes: Pupils are equal, round, and reactive to light. Conjunctivae and EOM are normal.  Neck: Normal range of motion. Neck supple.  Cardiovascular: Normal rate and regular rhythm. Exam reveals no gallop and no friction rub.  No murmur heard. Pulmonary/Chest: Effort normal and breath sounds normal. No respiratory distress. She has no wheezes. She has no rales. She exhibits no tenderness.  Clear to auscultation bilaterally  Abdominal: Soft. Bowel sounds are normal. She exhibits no distension and no mass. There is no tenderness. There is no rebound and no guarding.  Musculoskeletal: Normal range of motion. She exhibits no edema or tenderness.  Neurological: She is alert and oriented to person, place, and time.  Skin: Skin is warm and dry.  Psychiatric: She has a normal mood and affect. Her behavior is normal. Judgment and thought content normal.  Nursing note and vitals reviewed.    ED Treatments / Results  Labs (all labs ordered are listed, but only abnormal results are  displayed) Labs Reviewed - No data to display  EKG None  Radiology No results found.  Procedures Procedures (including critical care time)  Medications Ordered in ED Medications  LORazepam (ATIVAN) injection 1 mg (has no administration in time range)     Initial Impression / Assessment and Plan / ED Course  I have reviewed the triage vital signs and the nursing notes.  Pertinent labs & imaging results that were available during my care of the patient were reviewed by me and considered in my medical decision making (see chart for details).     Patient with reported asthma exacerbation, this seems improved, lung sounds are now clear with treatment by EMS, however she does seem to be suffering from a anxiety attack.  I will give her a milligram of Ativan IV.  Patient reassured.  O2 saturation is normal.  Patient is in no respiratory distress.  3:44 AM Patient reassessed, lungs are clear, 2 saturation is 100%.  Mildly tachycardic, could be residual from albuterol, but patient is in no acute distress.  She is eating and resting comfortably.  She states that she feels significantly improved.  Final Clinical Impressions(s) / ED Diagnoses   Final diagnoses:  Exacerbation of asthma, unspecified asthma severity, unspecified whether persistent  Panic attack    ED Discharge Orders         Ordered    predniSONE (DELTASONE) 20 MG tablet  Daily     03/12/18 0343           Roxy HorsemanBrowning, Briselda Naval, PA-C 03/12/18 0344    Derwood KaplanNanavati, Ankit, MD 03/12/18 [phone]

## 2018-03-12 NOTE — ED Notes (Signed)
Inhaler provided, but no dose administered prior to ER departure.

## 2018-03-12 NOTE — ED Triage Notes (Signed)
[facility] of [facility]-patient is out of her albuterol neb and inhaler at home. EMS administered albuterol 10 mg and Atrovent 0.5 mg and Solumedrol 125 mg IV. Patient also complaining of acute anxiety.

## 2018-03-12 NOTE — ED Notes (Signed)
Bed: AV40WA15 Expected date:  Expected time:  Means of arrival:  Comments: EMS 27 yo female wheezing/neb tx/solumedrol

## 2018-11-20 ENCOUNTER — Encounter (HOSPITAL_COMMUNITY): Payer: Self-pay

## 2018-11-20 ENCOUNTER — Emergency Department (HOSPITAL_COMMUNITY)
Admission: EM | Admit: 2018-11-20 | Discharge: 2018-11-20 | Disposition: A | Discharge status: Home / Self Care | Payer: Self-pay | Attending: Emergency Medicine | Admitting: Emergency Medicine

## 2018-11-20 ENCOUNTER — Other Ambulatory Visit: Payer: Self-pay

## 2018-11-20 DIAGNOSIS — R112 Nausea with vomiting, unspecified: Secondary | ICD-10-CM | POA: Insufficient documentation

## 2018-11-20 DIAGNOSIS — F17210 Nicotine dependence, cigarettes, uncomplicated: Secondary | ICD-10-CM | POA: Insufficient documentation

## 2018-11-20 DIAGNOSIS — Z79899 Other long term (current) drug therapy: Secondary | ICD-10-CM | POA: Insufficient documentation

## 2018-11-20 DIAGNOSIS — J45909 Unspecified asthma, uncomplicated: Secondary | ICD-10-CM | POA: Insufficient documentation

## 2018-11-20 DIAGNOSIS — N3090 Cystitis, unspecified without hematuria: Secondary | ICD-10-CM | POA: Insufficient documentation

## 2018-11-20 LAB — COMPREHENSIVE METABOLIC PANEL
ALT: 21 U/L (ref 0–44)
AST: 32 U/L (ref 15–41)
Albumin: 5 g/dL (ref 3.5–5.0)
Alkaline Phosphatase: 57 U/L (ref 38–126)
Anion gap: 14 (ref 5–15)
BUN: 13 mg/dL (ref 6–20)
CO2: 19 mmol/L — ABNORMAL LOW (ref 22–32)
Calcium: 9.3 mg/dL (ref 8.9–10.3)
Chloride: 108 mmol/L (ref 98–111)
Creatinine, Ser: 0.84 mg/dL (ref 0.44–1.00)
GFR calc Af Amer: >60 mL/min (ref >60)
GFR calc non Af Amer: >60 mL/min (ref >60)
Glucose, Bld: 109 mg/dL — ABNORMAL HIGH (ref 70–99)
Potassium: 3.9 mmol/L (ref 3.5–5.1)
Sodium: 141 mmol/L (ref 135–145)
Total Bilirubin: 0.9 mg/dL (ref 0.3–1.2)
Total Protein: 9 g/dL — ABNORMAL HIGH (ref 6.5–8.1)

## 2018-11-20 LAB — URINALYSIS, ROUTINE W REFLEX MICROSCOPIC
Bacteria, UA: NONE SEEN
Bilirubin Urine: NEGATIVE
Glucose, UA: NEGATIVE mg/dL
Hgb urine dipstick: NEGATIVE
Ketones, ur: 20 mg/dL — AB
Leukocytes,Ua: NEGATIVE
Nitrite: POSITIVE — AB
Protein, ur: 30 mg/dL — AB
Specific Gravity, Urine: 1.024 (ref 1.005–1.030)
pH: 7 (ref 5.0–8.0)

## 2018-11-20 LAB — CBC
HCT: 34.1 % — ABNORMAL LOW (ref 36.0–46.0)
Hemoglobin: 10.5 g/dL — ABNORMAL LOW (ref 12.0–15.0)
MCH: 21.6 pg — ABNORMAL LOW (ref 26.0–34.0)
MCHC: 30.8 g/dL (ref 30.0–36.0)
MCV: 70.3 fL — ABNORMAL LOW (ref 80.0–100.0)
Platelets: 381 10*3/uL (ref 150–400)
RBC: 4.85 MIL/uL (ref 3.87–5.11)
RDW: 20.2 % — ABNORMAL HIGH (ref 11.5–15.5)
WBC: 9.7 10*3/uL (ref 4.0–10.5)
nRBC: 0 % (ref 0.0–0.2)

## 2018-11-20 LAB — I-STAT BETA HCG BLOOD, ED (MC, WL, AP ONLY): I-stat hCG, quantitative: <5 m[IU]/mL (ref <5)

## 2018-11-20 LAB — RAPID URINE DRUG SCREEN, HOSP PERFORMED
Amphetamines: NOT DETECTED
Barbiturates: NOT DETECTED
Benzodiazepines: NOT DETECTED
Cocaine: NOT DETECTED
Opiates: POSITIVE — AB
Tetrahydrocannabinol: POSITIVE — AB

## 2018-11-20 LAB — LIPASE, BLOOD: Lipase: 21 U/L (ref 11–51)

## 2018-11-20 MED ORDER — KETOROLAC TROMETHAMINE 30 MG/ML IJ SOLN
30.0000 mg | Freq: Once | INTRAMUSCULAR | Status: AC
  Administered 2018-11-20: 30 mg via INTRAVENOUS
  Filled 2018-11-20: qty 1

## 2018-11-20 MED ORDER — PROMETHAZINE HCL 25 MG PO TABS: 25.0000 mg | ORAL_TABLET | Freq: Four times a day (QID) | ORAL | 0 refills | Status: DC | PRN

## 2018-11-20 MED ORDER — MORPHINE SULFATE (PF) 4 MG/ML IV SOLN
4.0000 mg | Freq: Once | INTRAVENOUS | Status: AC
  Administered 2018-11-20: 4 mg via INTRAVENOUS
  Filled 2018-11-20: qty 1

## 2018-11-20 MED ORDER — METOCLOPRAMIDE HCL 5 MG/ML IJ SOLN
10.0000 mg | Freq: Once | INTRAMUSCULAR | Status: AC
  Administered 2018-11-20: 10 mg via INTRAVENOUS
  Filled 2018-11-20: qty 2

## 2018-11-20 MED ORDER — SODIUM CHLORIDE 0.9% FLUSH
3.0000 mL | Freq: Once | INTRAVENOUS | Status: AC
  Administered 2018-11-20: 3 mL via INTRAVENOUS

## 2018-11-20 MED ORDER — ONDANSETRON 4 MG PO TBDP
4.0000 mg | ORAL_TABLET | Freq: Once | ORAL | Status: AC | PRN
  Administered 2018-11-20: 15:00:00 4 mg via ORAL
  Filled 2018-11-20: qty 1

## 2018-11-20 MED ORDER — METOCLOPRAMIDE HCL 5 MG/ML IJ SOLN
10.0000 mg | Freq: Once | INTRAMUSCULAR | Status: AC
  Administered 2018-11-20: 18:00:00 10 mg via INTRAVENOUS
  Filled 2018-11-20: qty 2

## 2018-11-20 MED ORDER — CEPHALEXIN 500 MG PO CAPS: 500.0000 mg | ORAL_CAPSULE | Freq: Four times a day (QID) | ORAL | 0 refills | Status: DC

## 2018-11-20 MED ORDER — SODIUM CHLORIDE 0.9 % IV BOLUS
1000.0000 mL | Freq: Once | INTRAVENOUS | Status: AC
  Administered 2018-11-20: 1000 mL via INTRAVENOUS

## 2018-11-20 NOTE — ED Provider Notes (Signed)
[facility]-EMERGENCY DEPT Provider Note   CSN: [id number] Arrival date & time: 11/20/18  1442    History   Chief Complaint Chief Complaint  Patient presents with  . Abdominal Pain  . Emesis    HPI Victoria Cline is a 28 y.o. female presents to the ER for evaluation of nausea, nonbilious nonbloody emesis, abdominal pain, hot chills for the last 2 days.  Abdominal pain is described as generalized but worse at the top above the bellybutton, nonradiating, constant, persistent.  No interventions at home.  She was given Zofran but states she has thrown up at least 10 times here.  She denies any history of similar symptoms in the past.  She denies abdominal surgeries.  She denies suspicious food intake.  No sick contacts.  Denies EtOH or marijuana use.  No associated fever, hematemesis, diarrhea, constipation, melena.  No other infectious symptoms such as myalgias, sore throat, cough, congestion, chest pain, shortness of breath.  No dysuria, hematuria, frequency or urgency.  States she would like to drink water and has been trying to because she is thirsty but cannot keep it down. HPI  Past Medical History:  Diagnosis Date  . Anxiety   . Asthma   . Fracture, pelvis closed (HCC)   . Pneumothorax     Patient Active Problem List   Diagnosis Date Noted  . Exposure to STD 01/31/2012  . Asthma 03/31/2011  . Mood disorder (HCC) 03/31/2011    Past Surgical History:  Procedure Laterality Date  . NO PAST SURGERIES       OB History    Gravida  2   Para      Term      Preterm      AB  2   Living        SAB  2   TAB      Ectopic      Multiple      Live Births           Obstetric Comments  Early SABs x 2 as teenager         Home Medications    Prior to Admission medications   Medication Sig Start Date End Date Taking? Authorizing Provider  cephALEXin (KEFLEX) 500 MG capsule Take 1 capsule (500 mg total) by mouth 4 (four) times daily.  11/20/18   Liberty HandyGibbons, Jakya Dovidio J, PA-C  metroNIDAZOLE (FLAGYL) 500 MG tablet Take 1 tablet (500 mg total) by mouth 2 (two) times daily. Patient not taking: Reported on 12/08/2017 09/21/17   Eustace MooreNelson, Yvonne Sue, MD  metroNIDAZOLE (FLAGYL) 500 MG tablet Take 1 tablet (500 mg total) by mouth 2 (two) times daily. One po bid x 7 days Patient not taking: Reported on 11/20/2018 12/08/17   Geoffery Lyonselo, Douglas, MD  predniSONE (DELTASONE) 20 MG tablet Take 2 tablets (40 mg total) by mouth daily. Patient not taking: Reported on 11/20/2018 03/12/18   Roxy HorsemanBrowning, Robert, PA-C  promethazine (PHENERGAN) 25 MG tablet Take 1 tablet (25 mg total) by mouth every 6 (six) hours as needed for nausea or vomiting. 11/20/18   Liberty HandyGibbons, Cyle Kenyon J, PA-C    Family History Family History  Problem Relation Age of Onset  . Cancer Mother        Living, brain tumor    Social History Social History   Tobacco Use  . Smoking status: Current Every Day Smoker    Packs/day: 0.25    Years: 5.00    Pack years: 1.25  Types: Cigarettes  . Smokeless tobacco: Never Used  Substance Use Topics  . Alcohol use: No  . Drug use: No     Allergies   Patient has no known allergies.   Review of Systems Review of Systems  Constitutional: Positive for chills.  Gastrointestinal: Positive for nausea and vomiting.  All other systems reviewed and are negative.    Physical Exam Updated Vital Signs BP 100/65   Pulse 81   Temp 98.7 F (37.1 C) (Oral)   Resp 14   LMP 11/13/2018   SpO2 100%   Physical Exam Vitals signs and nursing note reviewed.  Constitutional:      Appearance: She is well-developed.     Comments: Non toxic in NAD  HENT:     Head: Normocephalic and atraumatic.     Nose: Nose normal.  Eyes:     Conjunctiva/sclera: Conjunctivae normal.  Neck:     Musculoskeletal: Normal range of motion.  Cardiovascular:     Rate and Rhythm: Normal rate and regular rhythm.  Pulmonary:     Effort: Pulmonary effort is normal.     Breath  sounds: Normal breath sounds.  Abdominal:     General: Bowel sounds are normal.     Palpations: Abdomen is soft.     Tenderness: There is generalized abdominal tenderness.     Comments: No G/R/R. No suprapubic or CVA tenderness. Negative Murphy's and McBurney's. Active BS to lower quadrants.   Musculoskeletal: Normal range of motion.  Skin:    General: Skin is warm and dry.     Capillary Refill: Capillary refill takes less than 2 seconds.  Neurological:     Mental Status: She is alert.  Psychiatric:        Behavior: Behavior normal.      ED Treatments / Results  Labs (all labs ordered are listed, but only abnormal results are displayed) Labs Reviewed  COMPREHENSIVE METABOLIC PANEL - Abnormal; Notable for the following components:      Result Value   CO2 19 (*)    Glucose, Bld 109 (*)    Total Protein 9.0 (*)    All other components within normal limits  CBC - Abnormal; Notable for the following components:   Hemoglobin 10.5 (*)    HCT 34.1 (*)    MCV 70.3 (*)    MCH 21.6 (*)    RDW 20.2 (*)    All other components within normal limits  URINALYSIS, ROUTINE W REFLEX MICROSCOPIC - Abnormal; Notable for the following components:   APPearance CLOUDY (*)    Ketones, ur 20 (*)    Protein, ur 30 (*)    Nitrite POSITIVE (*)    All other components within normal limits  RAPID URINE DRUG SCREEN, HOSP PERFORMED - Abnormal; Notable for the following components:   Opiates POSITIVE (*)    Tetrahydrocannabinol POSITIVE (*)    All other components within normal limits  LIPASE, BLOOD  I-STAT BETA HCG BLOOD, ED ([facility], [facility], [facility] ONLY)    EKG None  Radiology No results found.  Procedures Procedures (including critical care time)  Medications Ordered in ED Medications  sodium chloride flush (NS) 0.9 % injection 3 mL (3 mLs Intravenous Given 11/20/18 1617)  ondansetron (ZOFRAN-ODT) disintegrating tablet 4 mg (4 mg Oral Given 11/20/18 1506)  metoCLOPramide (REGLAN) injection 10 mg (10 mg  Intravenous Given 11/20/18 1739)  morphine 4 MG/ML injection 4 mg (4 mg Intravenous Given 11/20/18 1739)  ketorolac (TORADOL) 30 MG/ML injection 30 mg (30  mg Intravenous Given 11/20/18 1738)  sodium chloride 0.9 % bolus 1,000 mL (1,000 mLs Intravenous New Bag/Given 11/20/18 1738)     Initial Impression / Assessment and Plan / ED Course  I have reviewed the triage vital signs and the nursing notes.  Pertinent labs & imaging results that were available during my care of the patient were reviewed by me and considered in my medical decision making (see chart for details).  Clinical Course as of Nov 19 2021  Sun Nov 20, 2018  1720 Hemoglobin(!): 10.5 [CG]  1720 Ketones, ur(!): 20 [CG]  1720 Nitrite(!): POSITIVE [CG]  1720 WBC, UA: 11-20 [CG]  1909 Opiates(!): POSITIVE [CG]  1909 Tetrahydrocannabinol(!): POSITIVE [CG]    Clinical Course User Index [CG] Kinnie Feil, PA-C   DDX includes viral process.  She has generalized abdominal tenderness, no fever.  ER work-up reviewed by me and remarkable as above.  No leukocytosis.  Lipase, LFTs normal.  Negative Murphy's, McBurney's.  No epigastric tenderness.  She has no diarrhea, fever.  Given symptoms, exam, blood work I have low suspicion for cholecystitis, appendicitis, diverticulitis, SBO.  She has no pelvic or vaginal complaints.   UA is nitrite positive, this could be contributing to her abdominal pain although she has no UTI symptoms, suprapubic or CVA tenderness.  She states that in the past she has had UTIs and never had symptoms.  Negative Hcg.   We will give IV fluids, pain control, nausea and reassess.  4008: Patient reevaluated.  She reports significant improvement in her symptoms.  No more nausea.  No vomiting since antiemetics.  She is tolerating 2 cups of water.  Given improvement in symptoms, benign work-up and reassessment do not think further emergent lab work, imaging or admission is indicated.  Will DC with Keflex, Phenergan,  NSAIDs, hydration.  Return precautions given.  Patient is comfortable with this plan.  Final Clinical Impressions(s) / ED Diagnoses   Final diagnoses:  Cystitis  Nausea and vomiting in adult patient    ED Discharge Orders         Ordered    cephALEXin (KEFLEX) 500 MG capsule  4 times daily     11/20/18 2012    promethazine (PHENERGAN) 25 MG tablet  Every 6 hours PRN     11/20/18 2012           Kinnie Feil, PA-C 11/20/18 2024    Pattricia Boss, MD 11/21/18 1356

## 2018-11-20 NOTE — Discharge Instructions (Addendum)
You were seen in the ER for nausea, vomiting, abdominal pain.  Blood work was normal.  Your urine showed signs of infection.  This could be causing your abdominal pain.  Take antibiotic as prescribed.  Take nausea medicine every 6 hours for the next 2 days to help and prevent nausea, vomiting.  Stay hydrated.  Drink plenty of fluids so that your urine is clear.  Alternate ibuprofen and acetaminophen every 6-8 hours for pain.  Return to the ER if there is fever greater than 100, worsening symptoms, localized abdominal pain to the right upper or right lower abdomen, flank pain, blood in your vomit or in your stool

## 2018-11-20 NOTE — ED Notes (Addendum)
Pt wheeled to room, able to ambulate to bathroom and back to room.

## 2018-11-20 NOTE — ED Notes (Addendum)
Pt provided water, per her request.  Will continue to monitor.

## 2019-02-17 ENCOUNTER — Emergency Department (HOSPITAL_COMMUNITY): Payer: Self-pay

## 2019-02-17 ENCOUNTER — Emergency Department (HOSPITAL_COMMUNITY)
Admission: EM | Admit: 2019-02-17 | Discharge: 2019-02-18 | Disposition: A | Discharge status: Home / Self Care | Payer: Self-pay | Attending: Emergency Medicine | Admitting: Emergency Medicine

## 2019-02-17 ENCOUNTER — Encounter (HOSPITAL_COMMUNITY): Payer: Self-pay | Admitting: Emergency Medicine

## 2019-02-17 ENCOUNTER — Other Ambulatory Visit: Payer: Self-pay

## 2019-02-17 DIAGNOSIS — Y9389 Activity, other specified: Secondary | ICD-10-CM | POA: Insufficient documentation

## 2019-02-17 DIAGNOSIS — K2900 Acute gastritis without bleeding: Secondary | ICD-10-CM | POA: Insufficient documentation

## 2019-02-17 DIAGNOSIS — S93602A Unspecified sprain of left foot, initial encounter: Secondary | ICD-10-CM | POA: Insufficient documentation

## 2019-02-17 DIAGNOSIS — Y9289 Other specified places as the place of occurrence of the external cause: Secondary | ICD-10-CM | POA: Insufficient documentation

## 2019-02-17 DIAGNOSIS — F17210 Nicotine dependence, cigarettes, uncomplicated: Secondary | ICD-10-CM | POA: Insufficient documentation

## 2019-02-17 DIAGNOSIS — W208XXA Other cause of strike by thrown, projected or falling object, initial encounter: Secondary | ICD-10-CM | POA: Insufficient documentation

## 2019-02-17 DIAGNOSIS — J45909 Unspecified asthma, uncomplicated: Secondary | ICD-10-CM | POA: Insufficient documentation

## 2019-02-17 DIAGNOSIS — Y998 Other external cause status: Secondary | ICD-10-CM | POA: Insufficient documentation

## 2019-02-17 DIAGNOSIS — R1084 Generalized abdominal pain: Secondary | ICD-10-CM | POA: Insufficient documentation

## 2019-02-17 DIAGNOSIS — R112 Nausea with vomiting, unspecified: Secondary | ICD-10-CM | POA: Insufficient documentation

## 2019-02-17 LAB — CBC
HCT: 35.3 % — ABNORMAL LOW (ref 36.0–46.0)
Hemoglobin: 11.2 g/dL — ABNORMAL LOW (ref 12.0–15.0)
MCH: 22.1 pg — ABNORMAL LOW (ref 26.0–34.0)
MCHC: 31.7 g/dL (ref 30.0–36.0)
MCV: 69.8 fL — ABNORMAL LOW (ref 80.0–100.0)
Platelets: 359 10*3/uL (ref 150–400)
RBC: 5.06 MIL/uL (ref 3.87–5.11)
RDW: 19.1 % — ABNORMAL HIGH (ref 11.5–15.5)
WBC: 10.3 10*3/uL (ref 4.0–10.5)
nRBC: 0 % (ref 0.0–0.2)

## 2019-02-17 MED ORDER — SODIUM CHLORIDE 0.9% FLUSH: 3.0000 mL | Freq: Once | INTRAVENOUS | Status: DC

## 2019-02-17 MED ORDER — ONDANSETRON 4 MG PO TBDP
4.0000 mg | ORAL_TABLET | Freq: Once | ORAL | Status: AC | PRN
  Administered 2019-02-17: 23:00:00 4 mg via ORAL
  Filled 2019-02-17: qty 1

## 2019-02-17 NOTE — ED Triage Notes (Signed)
Pt reports lower abd pain with n/v that started today. Pt vomiting in triage. Denies diarrhea. Pt reports she is unable to keep anything down. LMP today.  Pt also c/o left foot pain after dropping her mom's O2 tank onto her foot. Pt reports increased 10/10 pain with weight bearing.

## 2019-02-18 ENCOUNTER — Emergency Department (HOSPITAL_COMMUNITY): Payer: Self-pay

## 2019-02-18 LAB — COMPREHENSIVE METABOLIC PANEL
ALT: 16 U/L (ref 0–44)
AST: 20 U/L (ref 15–41)
Albumin: 4.1 g/dL (ref 3.5–5.0)
Alkaline Phosphatase: 53 U/L (ref 38–126)
Anion gap: 13 (ref 5–15)
BUN: 6 mg/dL (ref 6–20)
CO2: 19 mmol/L — ABNORMAL LOW (ref 22–32)
Calcium: 9.4 mg/dL (ref 8.9–10.3)
Chloride: 105 mmol/L (ref 98–111)
Creatinine, Ser: 0.71 mg/dL (ref 0.44–1.00)
GFR calc Af Amer: >60 mL/min (ref >60)
GFR calc non Af Amer: >60 mL/min (ref >60)
Glucose, Bld: 95 mg/dL (ref 70–99)
Potassium: 4 mmol/L (ref 3.5–5.1)
Sodium: 137 mmol/L (ref 135–145)
Total Bilirubin: 1.3 mg/dL — ABNORMAL HIGH (ref 0.3–1.2)
Total Protein: 7.3 g/dL (ref 6.5–8.1)

## 2019-02-18 LAB — I-STAT BETA HCG BLOOD, ED (MC, WL, AP ONLY): I-stat hCG, quantitative: <5 m[IU]/mL (ref <5)

## 2019-02-18 LAB — URINALYSIS, ROUTINE W REFLEX MICROSCOPIC
Bacteria, UA: NONE SEEN
Bilirubin Urine: NEGATIVE
Glucose, UA: NEGATIVE mg/dL
Ketones, ur: 20 mg/dL — AB
Nitrite: NEGATIVE
Protein, ur: 30 mg/dL — AB
RBC / HPF: >50 RBC/hpf — ABNORMAL HIGH (ref 0–5)
Specific Gravity, Urine: 1.025 (ref 1.005–1.030)
pH: 6 (ref 5.0–8.0)

## 2019-02-18 LAB — LIPASE, BLOOD: Lipase: 23 U/L (ref 11–51)

## 2019-02-18 MED ORDER — ONDANSETRON HCL 4 MG/2ML IJ SOLN
4.0000 mg | Freq: Once | INTRAMUSCULAR | Status: AC
  Administered 2019-02-18: 03:00:00 4 mg via INTRAVENOUS
  Filled 2019-02-18: qty 2

## 2019-02-18 MED ORDER — DIPHENHYDRAMINE HCL 50 MG/ML IJ SOLN
25.0000 mg | Freq: Once | INTRAMUSCULAR | Status: AC
  Administered 2019-02-18: 25 mg via INTRAVENOUS
  Filled 2019-02-18: qty 1

## 2019-02-18 MED ORDER — IOHEXOL 300 MG/ML  SOLN
100.0000 mL | Freq: Once | INTRAMUSCULAR | Status: AC | PRN
  Administered 2019-02-18: 04:00:00 100 mL via INTRAVENOUS

## 2019-02-18 MED ORDER — FENTANYL CITRATE (PF) 100 MCG/2ML IJ SOLN
100.0000 ug | Freq: Once | INTRAMUSCULAR | Status: AC
  Administered 2019-02-18: 100 ug via INTRAVENOUS
  Filled 2019-02-18: qty 2

## 2019-02-18 MED ORDER — ONDANSETRON HCL 4 MG/2ML IJ SOLN
4.0000 mg | Freq: Once | INTRAMUSCULAR | Status: AC
  Administered 2019-02-18: 07:00:00 4 mg via INTRAVENOUS
  Filled 2019-02-18: qty 2

## 2019-02-18 MED ORDER — ONDANSETRON 8 MG PO TBDP: ORAL_TABLET | ORAL | 0 refills | Status: DC

## 2019-02-18 MED ORDER — SODIUM CHLORIDE 0.9 % IV BOLUS (SEPSIS)
1000.0000 mL | Freq: Once | INTRAVENOUS | Status: AC
  Administered 2019-02-18: 04:00:00 1000 mL via INTRAVENOUS

## 2019-02-18 MED ORDER — METOCLOPRAMIDE HCL 5 MG/ML IJ SOLN
10.0000 mg | Freq: Once | INTRAMUSCULAR | Status: AC
  Administered 2019-02-18: 04:00:00 10 mg via INTRAVENOUS
  Filled 2019-02-18: qty 2

## 2019-02-18 NOTE — ED Notes (Signed)
Pt back from CT

## 2019-02-18 NOTE — ED Notes (Signed)
Patient to CT.

## 2019-02-18 NOTE — Discharge Instructions (Signed)

## 2019-02-18 NOTE — ED Provider Notes (Signed)
[facility] EMERGENCY DEPARTMENT Provider Note   CSN: [id number] Arrival date & time: 02/17/19  2250     History   Chief Complaint Chief Complaint  Patient presents with  . Abdominal Pain  . Emesis  . Foot Pain    HPI Victoria Cline is a 28 y.o. female.     The history is provided by the patient.  Abdominal Pain Pain location:  Generalized Pain quality: cramping   Pain severity:  Moderate Onset quality:  Gradual Duration:  1 day Timing:  Constant Progression:  Worsening Chronicity:  New Relieved by:  Nothing Worsened by:  Palpation and movement Associated symptoms: cough, nausea and vomiting   Associated symptoms: no dysuria, no fever, no hematemesis, no hematochezia, no melena and no vaginal discharge   Emesis Associated symptoms: abdominal pain, arthralgias and cough   Associated symptoms: no fever   Foot Pain This is a new problem. The current episode started 12 to 24 hours ago. The problem occurs constantly. The problem has been gradually worsening. Associated symptoms include abdominal pain. The symptoms are aggravated by walking. The symptoms are relieved by rest.  Patient presents for 2 complaints.  She reports abdominal pain over the past day.  She reports that she had non bloody emesis.  No diarrhea or change in bowel habits.  No dysuria.  No vaginal discharge.  She has never had this pain before. She reports some cough and pain with vomiting.  She also reports left foot pain.  She reports she dropped her mother's oxygen tank on her foot.  She reports it hurts to walk.  Past Medical History:  Diagnosis Date  . Anxiety   . Asthma   . Fracture, pelvis closed ([place])   . Pneumothorax     Patient Active Problem List   Diagnosis Date Noted  . Exposure to STD 01/31/2012  . Asthma 03/31/2011  . Mood disorder ([facility]) 03/31/2011    Past Surgical History:  Procedure Laterality Date  . NO PAST SURGERIES       OB History    Gravida   2   Para      Term      Preterm      AB  2   Living        SAB  2   TAB      Ectopic      Multiple      Live Births           Obstetric Comments  Early SABs x 2 as teenager         Home Medications    Prior to Admission medications   Medication Sig Start Date End Date Taking? Authorizing Provider  promethazine (PHENERGAN) 25 MG tablet Take 1 tablet (25 mg total) by mouth every 6 (six) hours as needed for nausea or vomiting. 11/20/18 02/18/19  Kinnie Feil, PA-C    Family History Family History  Problem Relation Age of Onset  . Cancer Mother        Living, brain tumor    Social History Social History   Tobacco Use  . Smoking status: Current Every Day Smoker    Packs/day: 0.25    Years: 5.00    Pack years: 1.25    Types: Cigarettes  . Smokeless tobacco: Never Used  Substance Use Topics  . Alcohol use: No  . Drug use: No     Allergies   Patient has no known allergies.   Review of  Systems Review of Systems  Constitutional: Negative for fever.  Respiratory: Positive for cough.   Gastrointestinal: Positive for abdominal pain, nausea and vomiting. Negative for blood in stool, hematemesis, hematochezia and melena.  Genitourinary: Negative for dysuria and vaginal discharge.  Musculoskeletal: Positive for arthralgias.  All other systems reviewed and are negative.    Physical Exam Updated Vital Signs BP 104/60   Pulse 82   Temp 98.6 F (37 C) (Oral)   Resp 19   Ht 1.6 m (5\' 3" )   Wt 54.4 kg   LMP 02/17/2019   SpO2 100%   BMI 21.26 kg/m   Physical Exam CONSTITUTIONAL: Well developed/well nourished, uncomfortable appearing HEAD: Normocephalic/atraumatic EYES: EOMI/PERRL ENMT: Mucous membranes moist NECK: supple no meningeal signs SPINE/BACK:entire spine nontender CV: S1/S2 noted, no murmurs/rubs/gallops noted LUNGS: Lungs are clear to auscultation bilaterally, no apparent distress ABDOMEN: soft, moderate diffuse abdominal  tenderness, no rebound or guarding, bowel sounds noted throughout abdomen GU:no cva tenderness NEURO: Pt is awake/alert/appropriate, moves all extremitiesx4.  No facial droop.   EXTREMITIES: pulses normal/equal, full ROM, tenderness noted to left foot but no deformities.  Distal pulses intact.  No other signs of traumatic injury to the left leg SKIN: warm, color normal PSYCH: no abnormalities of mood noted, alert and oriented to situation   ED Treatments / Results  Labs (all labs ordered are listed, but only abnormal results are displayed) Labs Reviewed  COMPREHENSIVE METABOLIC PANEL - Abnormal; Notable for the following components:      Result Value   CO2 19 (*)    Total Bilirubin 1.3 (*)    All other components within normal limits  CBC - Abnormal; Notable for the following components:   Hemoglobin 11.2 (*)    HCT 35.3 (*)    MCV 69.8 (*)    MCH 22.1 (*)    RDW 19.1 (*)    All other components within normal limits  URINALYSIS, ROUTINE W REFLEX MICROSCOPIC - Abnormal; Notable for the following components:   APPearance HAZY (*)    Hgb urine dipstick LARGE (*)    Ketones, ur 20 (*)    Protein, ur 30 (*)    Leukocytes,Ua TRACE (*)    RBC / HPF >50 (*)    All other components within normal limits  LIPASE, BLOOD  I-STAT BETA HCG BLOOD, ED ([facility], [facility], [facility] ONLY)    EKG None  Radiology Ct Abdomen Pelvis W Contrast  Result Date: 02/18/2019 CLINICAL DATA:  Abdominal pain with nausea and vomiting beginning today. EXAM: CT ABDOMEN AND PELVIS WITH CONTRAST TECHNIQUE: Multidetector CT imaging of the abdomen and pelvis was performed using the standard protocol following bolus administration of intravenous contrast. CONTRAST:  13/10/2018 OMNIPAQUE IOHEXOL 300 MG/ML  SOLN COMPARISON:  10/25/2016 FINDINGS: Lower chest: Lung bases are clear. Hepatobiliary: Liver, gallbladder and biliary tree are normal. Pancreas: Normal. Spleen: Normal. Adrenals/Urinary Tract: Adrenal glands are normal. Kidneys are  normal in size without hydronephrosis or nephrolithiasis. Bladder is normal. Stomach/Bowel: Mild wall thickening over the body of the stomach possibly due to gastritis. Small bowel is unremarkable. Appendix is normal. Colon is unremarkable. Vascular/Lymphatic: Abdominal aorta is normal caliber. Prominent pelvic/periuterine veins left worse than right similar to previous exam. No adenopathy. Reproductive: Retroverted uterus.  Otherwise unremarkable. Other: No free fluid or focal inflammatory change. No free peritoneal air. Musculoskeletal: Normal. IMPRESSION: 1. Mild wall thickening of the body of the stomach which may be due to gastritis. 2. Prominent pelvic/periuterine veins left worse than right similar to  the previous exam and can be seen with pelvic venous congestion syndrome. Electronically Signed   By: Elberta Fortisaniel  Boyle M.D.   On: 02/18/2019 05:11   Dg Foot Complete Left  Result Date: 02/17/2019 CLINICAL DATA:  Injury to the foot EXAM: LEFT FOOT - COMPLETE 3+ VIEW COMPARISON:  None. FINDINGS: There is no evidence of fracture or dislocation. There is no evidence of arthropathy or other focal bone abnormality. Soft tissues are unremarkable. IMPRESSION: Negative. Electronically Signed   By: Jonna ClarkBindu  Avutu M.D.   On: 02/17/2019 23:54    Procedures Procedures   Medications Ordered in ED Medications  sodium chloride flush (NS) 0.9 % injection 3 mL (has no administration in time range)  ondansetron (ZOFRAN-ODT) disintegrating tablet 4 mg (4 mg Oral Given 02/17/19 2301)  ondansetron (ZOFRAN) injection 4 mg (4 mg Intravenous Given 02/18/19 0258)  fentaNYL (SUBLIMAZE) injection 100 mcg (100 mcg Intravenous Given 02/18/19 0323)  sodium chloride 0.9 % bolus 1,000 mL (0 mLs Intravenous Stopped 02/18/19 0531)  metoCLOPramide (REGLAN) injection 10 mg (10 mg Intravenous Given 02/18/19 0405)  diphenhydrAMINE (BENADRYL) injection 25 mg (25 mg Intravenous Given 02/18/19 0405)  iohexol (OMNIPAQUE) 300 MG/ML solution 100 mL  (100 mLs Intravenous Contrast Given 02/18/19 0414)     Initial Impression / Assessment and Plan / ED Course  I have reviewed the triage vital signs and the nursing notes.  Pertinent labs & imaging results that were available during my care of the patient were reviewed by me and considered in my medical decision making (see chart for details).        CT imaging performed due to diffuse abdominal pain and vomiting.  CT scans negative for acute disease.  Questionable gastritis. As for her left foot, no signs of acute fracture. 7:07 AM Patient offered crutches at discharge. Referred to orthopedics for her foot injury. Final Clinical Impressions(s) / ED Diagnoses   Final diagnoses:  Generalized abdominal pain  Acute gastritis without hemorrhage, unspecified gastritis type  Foot sprain, left, initial encounter    ED Discharge Orders         Ordered    ondansetron (ZOFRAN ODT) 8 MG disintegrating tablet     02/18/19 [id number]           Zadie RhineWickline, Benjamin Casanas, MD 02/18/19 [phone]

## 2019-02-18 NOTE — ED Notes (Signed)
Patient verbalizes understanding of discharge instructions. Opportunity for questioning and answers were provided. Armband removed by staff, pt discharged from ED. Pt. ambulatory and discharged home.  

## 2019-03-22 ENCOUNTER — Other Ambulatory Visit: Payer: Self-pay

## 2019-03-22 ENCOUNTER — Emergency Department (HOSPITAL_COMMUNITY)
Admission: EM | Admit: 2019-03-22 | Discharge: 2019-03-23 | Disposition: A | Discharge status: Home / Self Care | Payer: BLUE CROSS/BLUE SHIELD | Attending: Emergency Medicine | Admitting: Emergency Medicine

## 2019-03-22 ENCOUNTER — Encounter (HOSPITAL_COMMUNITY): Payer: Self-pay | Admitting: Emergency Medicine

## 2019-03-22 DIAGNOSIS — R112 Nausea with vomiting, unspecified: Secondary | ICD-10-CM | POA: Diagnosis not present

## 2019-03-22 DIAGNOSIS — J45909 Unspecified asthma, uncomplicated: Secondary | ICD-10-CM | POA: Diagnosis not present

## 2019-03-22 DIAGNOSIS — F17210 Nicotine dependence, cigarettes, uncomplicated: Secondary | ICD-10-CM | POA: Diagnosis not present

## 2019-03-22 DIAGNOSIS — R1084 Generalized abdominal pain: Secondary | ICD-10-CM | POA: Diagnosis not present

## 2019-03-22 LAB — COMPREHENSIVE METABOLIC PANEL
ALT: 20 U/L (ref 0–44)
AST: 26 U/L (ref 15–41)
Albumin: 4.4 g/dL (ref 3.5–5.0)
Alkaline Phosphatase: 54 U/L (ref 38–126)
Anion gap: 14 (ref 5–15)
BUN: 11 mg/dL (ref 6–20)
CO2: 19 mmol/L — ABNORMAL LOW (ref 22–32)
Calcium: 9.4 mg/dL (ref 8.9–10.3)
Chloride: 105 mmol/L (ref 98–111)
Creatinine, Ser: 0.75 mg/dL (ref 0.44–1.00)
GFR calc Af Amer: >60 mL/min (ref >60)
GFR calc non Af Amer: >60 mL/min (ref >60)
Glucose, Bld: 104 mg/dL — ABNORMAL HIGH (ref 70–99)
Potassium: 4 mmol/L (ref 3.5–5.1)
Sodium: 138 mmol/L (ref 135–145)
Total Bilirubin: 0.5 mg/dL (ref 0.3–1.2)
Total Protein: 7.7 g/dL (ref 6.5–8.1)

## 2019-03-22 LAB — CBC
HCT: 37.6 % (ref 36.0–46.0)
Hemoglobin: 12 g/dL (ref 12.0–15.0)
MCH: 22.1 pg — ABNORMAL LOW (ref 26.0–34.0)
MCHC: 31.9 g/dL (ref 30.0–36.0)
MCV: 69.4 fL — ABNORMAL LOW (ref 80.0–100.0)
Platelets: 416 10*3/uL — ABNORMAL HIGH (ref 150–400)
RBC: 5.42 MIL/uL — ABNORMAL HIGH (ref 3.87–5.11)
RDW: 19.9 % — ABNORMAL HIGH (ref 11.5–15.5)
WBC: 13.6 10*3/uL — ABNORMAL HIGH (ref 4.0–10.5)
nRBC: 0 % (ref 0.0–0.2)

## 2019-03-22 LAB — LIPASE, BLOOD: Lipase: 19 U/L (ref 11–51)

## 2019-03-22 LAB — I-STAT BETA HCG BLOOD, ED (MC, WL, AP ONLY): I-stat hCG, quantitative: <5 m[IU]/mL (ref <5)

## 2019-03-22 MED ORDER — DIPHENHYDRAMINE HCL 50 MG/ML IJ SOLN
12.5000 mg | Freq: Once | INTRAMUSCULAR | Status: AC
  Administered 2019-03-22: 12.5 mg via INTRAVENOUS
  Filled 2019-03-22: qty 1

## 2019-03-22 MED ORDER — ONDANSETRON 4 MG PO TBDP
4.0000 mg | ORAL_TABLET | Freq: Once | ORAL | Status: AC | PRN
  Administered 2019-03-22: 4 mg via ORAL
  Filled 2019-03-22: qty 1

## 2019-03-22 MED ORDER — SODIUM CHLORIDE 0.9% FLUSH
3.0000 mL | Freq: Once | INTRAVENOUS | Status: AC
  Administered 2019-03-22: 3 mL via INTRAVENOUS

## 2019-03-22 MED ORDER — METOCLOPRAMIDE HCL 5 MG/ML IJ SOLN
10.0000 mg | Freq: Once | INTRAMUSCULAR | Status: AC
  Administered 2019-03-22: 10 mg via INTRAVENOUS
  Filled 2019-03-22: qty 2

## 2019-03-22 MED ORDER — HYDROMORPHONE HCL 1 MG/ML IJ SOLN
0.5000 mg | Freq: Once | INTRAMUSCULAR | Status: AC
  Administered 2019-03-22: 0.5 mg via INTRAVENOUS
  Filled 2019-03-22: qty 1

## 2019-03-22 NOTE — ED Triage Notes (Signed)
Pt BIB [facility], c/o abdominal pain, nausea and vomiting since noon today.

## 2019-03-22 NOTE — ED Notes (Signed)
Pt reports abdominal pain that started at 2pm. Has had n/v since then and 10/10 abd pain.

## 2019-03-22 NOTE — ED Notes (Signed)
Pt requested something to drink, to see if she could "hold it down". Provider aware and okay'd

## 2019-03-22 NOTE — ED Provider Notes (Signed)
[facility] EMERGENCY DEPARTMENT Provider Note   CSN: [id number] Arrival date & time: 03/22/19  1925     History   Chief Complaint Chief Complaint  Patient presents with  . Abdominal Pain    HPI Victoria Cline is a 28 y.o. female.     Patient to ED for symptoms x 1 day of nausea, vomiting and abdominal pain. No fever, diarrhea, hematemesis, chest pain, SOB, cough. She reports similar symptoms in the recent past without definite diagnosis. No sick contacts. The abdominal pain is described as generalized and started after several episodes of vomiting. No urinary symptoms or vaginal discharge.   The history is provided by the patient. No language interpreter was used.  Abdominal Pain Associated symptoms: nausea and vomiting   Associated symptoms: no chills, no cough, no diarrhea, no dysuria, no fever, no shortness of breath and no vaginal discharge     Past Medical History:  Diagnosis Date  . Anxiety   . Asthma   . Fracture, pelvis closed ([place])   . Pneumothorax     Patient Active Problem List   Diagnosis Date Noted  . Exposure to STD 01/31/2012  . Asthma 03/31/2011  . Mood disorder ([place]) 03/31/2011    Past Surgical History:  Procedure Laterality Date  . NO PAST SURGERIES       OB History    Gravida  2   Para      Term      Preterm      AB  2   Living        SAB  2   TAB      Ectopic      Multiple      Live Births           Obstetric Comments  Early SABs x 2 as teenager         Home Medications    Prior to Admission medications   Medication Sig Start Date End Date Taking? Authorizing Provider  ondansetron (ZOFRAN ODT) 8 MG disintegrating tablet 8mg  ODT q4 hours prn nausea Patient not taking: Reported on 03/22/2019 02/18/19   Ripley Fraise, MD  promethazine (PHENERGAN) 25 MG tablet Take 1 tablet (25 mg total) by mouth every 6 (six) hours as needed for nausea or vomiting. 11/20/18 02/18/19  Kinnie Feil, PA-C     Family History Family History  Problem Relation Age of Onset  . Cancer Mother        Living, brain tumor    Social History Social History   Tobacco Use  . Smoking status: Current Every Day Smoker    Packs/day: 0.25    Years: 5.00    Pack years: 1.25    Types: Cigarettes  . Smokeless tobacco: Never Used  Substance Use Topics  . Alcohol use: No  . Drug use: No     Allergies   Patient has no known allergies.   Review of Systems Review of Systems  Constitutional: Negative for chills and fever.  Respiratory: Negative.  Negative for cough and shortness of breath.   Cardiovascular: Negative.   Gastrointestinal: Positive for abdominal pain, nausea and vomiting. Negative for diarrhea.  Genitourinary: Negative for dysuria, pelvic pain and vaginal discharge.  Musculoskeletal: Negative.  Negative for back pain.  Skin: Negative.   Neurological: Negative.      Physical Exam Updated Vital Signs BP (!) 103/52 (BP Location: Right Arm)   Pulse 68   Temp 98.4 F (36.9 C) (Oral)  Resp 10   SpO2 94%   Physical Exam Vitals signs and nursing note reviewed.  Constitutional:      Appearance: She is well-developed.  HENT:     Head: Normocephalic.  Neck:     Musculoskeletal: Normal range of motion and neck supple.  Cardiovascular:     Rate and Rhythm: Normal rate and regular rhythm.     Heart sounds: No murmur.  Pulmonary:     Effort: Pulmonary effort is normal.     Breath sounds: Normal breath sounds.  Abdominal:     General: There is no distension.     Palpations: Abdomen is soft.     Tenderness: There is generalized abdominal tenderness. There is no guarding or rebound.  Musculoskeletal: Normal range of motion.  Skin:    General: Skin is warm and dry.     Findings: No rash.  Neurological:     Mental Status: She is alert.     Cranial Nerves: No cranial nerve deficit.      ED Treatments / Results  Labs (all labs ordered are listed, but only abnormal  results are displayed) Labs Reviewed  COMPREHENSIVE METABOLIC PANEL - Abnormal; Notable for the following components:      Result Value   CO2 19 (*)    Glucose, Bld 104 (*)    All other components within normal limits  CBC - Abnormal; Notable for the following components:   WBC 13.6 (*)    RBC 5.42 (*)    MCV 69.4 (*)    MCH 22.1 (*)    RDW 19.9 (*)    Platelets 416 (*)    All other components within normal limits  LIPASE, BLOOD  URINALYSIS, ROUTINE W REFLEX MICROSCOPIC  I-STAT BETA HCG BLOOD, ED ([facility], [facility], [facility] ONLY)    EKG None  Radiology No results found.  Procedures Procedures (including critical care time)  Medications Ordered in ED Medications  sodium chloride flush (NS) 0.9 % injection 3 mL (has no administration in time range)  ondansetron (ZOFRAN-ODT) disintegrating tablet 4 mg (4 mg Oral Given 03/22/19 1935)  metoCLOPramide (REGLAN) injection 10 mg (10 mg Intravenous Given 03/22/19 2232)  diphenhydrAMINE (BENADRYL) injection 12.5 mg (12.5 mg Intravenous Given 03/22/19 2232)  HYDROmorphone (DILAUDID) injection 0.5 mg (0.5 mg Intravenous Given 03/22/19 2305)     Initial Impression / Assessment and Plan / ED Course  I have reviewed the triage vital signs and the nursing notes.  Pertinent labs & imaging results that were available during my care of the patient were reviewed by me and considered in my medical decision making (see chart for details).        Patient to ED with N, V and abdominal pain that started earlier today. No fever, diarrhea.   Patient given ODT Zofran on arrival without relief. IVF's started. Reglan, benadryl provided. She states vomiting has stopped but pain and nausea continue. Dilaudid 0.5 mg provided.   Patient re-evaluated at intervals. No further vomiting. Nausea persists. Haldol provided.   PO challenge of ginger ale and crackers provided. No vomiting. Feels she can be discharged home with PO and PR antiemetics. Return precautions  discussed.   Final Clinical Impressions(s) / ED Diagnoses   Final diagnoses:  None   1. Nausea and vomiting  ED Discharge Orders    None       Elpidio Anis, PA-C 03/23/19 0302    Virgina Norfolk, DO 03/23/19 1707

## 2019-03-23 MED ORDER — HALOPERIDOL LACTATE 5 MG/ML IJ SOLN
1.0000 mg | Freq: Once | INTRAMUSCULAR | Status: AC
  Administered 2019-03-23: 1 mg via INTRAVENOUS
  Filled 2019-03-23: qty 1

## 2019-03-23 MED ORDER — ONDANSETRON 4 MG PO TBDP: 4.0000 mg | ORAL_TABLET | Freq: Three times a day (TID) | ORAL | 0 refills | Status: DC | PRN

## 2019-03-23 MED ORDER — ONDANSETRON 8 MG PO TBDP: ORAL_TABLET | ORAL | 0 refills | Status: DC

## 2019-03-23 NOTE — Discharge Instructions (Signed)
Take Zofran at home to control nausea. Drink fluids in small amount to avoid dehydration. Return to the emergency department as needed.

## 2019-03-23 NOTE — ED Notes (Signed)
PO challenge administered again

## 2019-04-06 ENCOUNTER — Emergency Department (HOSPITAL_COMMUNITY)
Admission: EM | Admit: 2019-04-06 | Discharge: 2019-04-06 | Payer: BLUE CROSS/BLUE SHIELD | Attending: Emergency Medicine | Admitting: Emergency Medicine

## 2019-04-06 ENCOUNTER — Other Ambulatory Visit: Payer: Self-pay

## 2019-04-06 DIAGNOSIS — Z008 Encounter for other general examination: Secondary | ICD-10-CM | POA: Diagnosis not present

## 2019-04-06 DIAGNOSIS — Y999 Unspecified external cause status: Secondary | ICD-10-CM | POA: Insufficient documentation

## 2019-04-06 DIAGNOSIS — Y9389 Activity, other specified: Secondary | ICD-10-CM | POA: Insufficient documentation

## 2019-04-06 DIAGNOSIS — Y929 Unspecified place or not applicable: Secondary | ICD-10-CM | POA: Diagnosis not present

## 2019-04-06 DIAGNOSIS — J45909 Unspecified asthma, uncomplicated: Secondary | ICD-10-CM | POA: Insufficient documentation

## 2019-04-06 DIAGNOSIS — F17210 Nicotine dependence, cigarettes, uncomplicated: Secondary | ICD-10-CM | POA: Insufficient documentation

## 2019-04-06 DIAGNOSIS — S0081XA Abrasion of other part of head, initial encounter: Secondary | ICD-10-CM | POA: Insufficient documentation

## 2019-04-06 DIAGNOSIS — S0990XA Unspecified injury of head, initial encounter: Secondary | ICD-10-CM

## 2019-04-06 MED ORDER — ACETAMINOPHEN 500 MG PO TABS
1000.0000 mg | ORAL_TABLET | Freq: Once | ORAL | Status: AC
  Administered 2019-04-06: 05:00:00 1000 mg via ORAL
  Filled 2019-04-06: qty 2

## 2019-04-06 MED ORDER — METOCLOPRAMIDE HCL 10 MG PO TABS
10.0000 mg | ORAL_TABLET | Freq: Once | ORAL | Status: AC
  Administered 2019-04-06: 10 mg via ORAL
  Filled 2019-04-06: qty 1

## 2019-04-06 NOTE — ED Notes (Addendum)
While performing visual acuity, pt was hesitant to open her eyes and was not very cooperative with instructions. The results charted were the best attempts.
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
## 2019-04-06 NOTE — ED Notes (Signed)
Pt ambulated independently to the room with steady gait.

## 2019-06-23 ENCOUNTER — Encounter (HOSPITAL_COMMUNITY): Payer: Self-pay | Admitting: Obstetrics and Gynecology

## 2019-06-23 ENCOUNTER — Inpatient Hospital Stay (HOSPITAL_COMMUNITY)
Admission: AD | Admit: 2019-06-23 | Discharge: 2019-06-23 | Disposition: A | Discharge status: Home / Self Care | Payer: BLUE CROSS/BLUE SHIELD | Attending: Obstetrics and Gynecology | Admitting: Obstetrics and Gynecology

## 2019-06-23 ENCOUNTER — Other Ambulatory Visit: Payer: Self-pay

## 2019-06-23 DIAGNOSIS — M545 Low back pain: Secondary | ICD-10-CM

## 2019-06-23 DIAGNOSIS — N939 Abnormal uterine and vaginal bleeding, unspecified: Secondary | ICD-10-CM | POA: Diagnosis present

## 2019-06-23 DIAGNOSIS — R1084 Generalized abdominal pain: Secondary | ICD-10-CM

## 2019-06-23 DIAGNOSIS — Z3202 Encounter for pregnancy test, result negative: Secondary | ICD-10-CM

## 2019-06-23 LAB — HCG, QUANTITATIVE, PREGNANCY: hCG, Beta Chain, Quant, S: <1 m[IU]/mL (ref <5)

## 2019-06-23 LAB — POCT PREGNANCY, URINE: Preg Test, Ur: NEGATIVE

## 2019-06-23 NOTE — MAU Note (Signed)
+  HPT last week.  Started bleeding yesterday. No clots.  Cramping in lower abd and low back, kind of feels like period is going to start.

## 2019-06-23 NOTE — MAU Provider Note (Signed)
First Provider Initiated Contact with Patient 06/23/19 1632      S [name] is a 29 y.o. [id number] non-pregnant female who presents to MAU today with complaint of (+) HPT last week, VB that started 06/22/2019 and lower abdominal and lower back pain "like a period is about to start." She reports her LMP was 05/28/2019 through 06/01/2019. She states her menstrual cycle is 22-25 days, so she expected her period to come on 06/18/2019.   O BP 115/82 (BP Location: Right Arm)   Pulse 97   Temp 98.2 F (36.8 C) (Oral)   Resp 16   Ht 5\' 5"  (1.651 m)   Wt 54.4 kg   LMP 05/28/2019   SpO2 100%   BMI 19.97 kg/m  Physical Exam  Nursing note and vitals reviewed. Constitutional: She is oriented to person, place, and time. She appears well-developed and well-nourished.  HENT:  Head: Normocephalic and atraumatic.  Eyes: Pupils are equal, round, and reactive to light.  Cardiovascular: Normal rate.  Respiratory: Effort normal.  Genitourinary:    Genitourinary Comments: Not indicated at this time -- may need to perform if returns for ectopic work-up   Musculoskeletal:        General: Normal range of motion.     Cervical back: Normal range of motion.  Neurological: She is alert and oriented to person, place, and time.  Skin: Skin is warm and dry.  Psychiatric: She has a normal mood and affect. Her behavior is normal. Judgment and thought content normal.   Results for orders placed or performed during the hospital encounter of 06/23/19 (from the past 24 hour(s))  Pregnancy, urine POC     Status: None   Collection Time: 06/23/19  4:22 PM  Result Value Ref Range   Preg Test, Ur NEGATIVE NEGATIVE  hCG, quantitative, pregnancy     Status: None   Collection Time: 06/23/19  4:39 PM  Result Value Ref Range   hCG, Beta Chain, Quant, S <1 <5 mIU/mL    A Non pregnant female Medical screening exam complete No diagnosis found.   P Discharge from MAU in stable condition Patient discharged  home after HCG drawn. Phone number verified [phone] TC to patient @ [phone]. Patient identified by 2 identifiers (name and DOB). Notified of negative HCG level meaning that she is not pregnant and more than likely had a false positive HPT. Advised that menses is probably 1 day past her normal cycle length (26 days vs 22-25 days). Patient may return to MAU as needed for pregnancy related complaints. Patient verbalized an understanding of the plan of care and agrees.   [phone], CNM 06/23/2019 4:32 PM

## 2019-09-15 DIAGNOSIS — Y929 Unspecified place or not applicable: Secondary | ICD-10-CM | POA: Insufficient documentation

## 2019-09-15 DIAGNOSIS — G92 Toxic encephalopathy: Secondary | ICD-10-CM | POA: Insufficient documentation

## 2019-09-15 DIAGNOSIS — S0990XA Unspecified injury of head, initial encounter: Secondary | ICD-10-CM | POA: Insufficient documentation

## 2019-09-15 DIAGNOSIS — Y998 Other external cause status: Secondary | ICD-10-CM | POA: Insufficient documentation

## 2019-09-15 DIAGNOSIS — Y9389 Activity, other specified: Secondary | ICD-10-CM | POA: Insufficient documentation

## 2019-09-15 DIAGNOSIS — F17210 Nicotine dependence, cigarettes, uncomplicated: Secondary | ICD-10-CM | POA: Insufficient documentation

## 2019-09-15 DIAGNOSIS — R4182 Altered mental status, unspecified: Secondary | ICD-10-CM | POA: Insufficient documentation

## 2019-09-15 DIAGNOSIS — F1210 Cannabis abuse, uncomplicated: Secondary | ICD-10-CM | POA: Insufficient documentation

## 2019-09-15 DIAGNOSIS — J45909 Unspecified asthma, uncomplicated: Secondary | ICD-10-CM | POA: Insufficient documentation

## 2019-09-15 DIAGNOSIS — F1010 Alcohol abuse, uncomplicated: Secondary | ICD-10-CM | POA: Insufficient documentation

## 2019-09-15 DIAGNOSIS — Y906 Blood alcohol level of 120-199 mg/100 ml: Secondary | ICD-10-CM | POA: Insufficient documentation

## 2019-09-16 ENCOUNTER — Emergency Department (HOSPITAL_COMMUNITY): Payer: Self-pay

## 2019-09-16 ENCOUNTER — Other Ambulatory Visit: Payer: Self-pay

## 2019-09-16 ENCOUNTER — Encounter (HOSPITAL_COMMUNITY): Payer: Self-pay | Admitting: Emergency Medicine

## 2019-09-16 ENCOUNTER — Emergency Department (HOSPITAL_COMMUNITY)
Admission: EM | Admit: 2019-09-16 | Discharge: 2019-09-16 | Disposition: A | Discharge status: Home / Self Care | Payer: Self-pay | Attending: Emergency Medicine | Admitting: Emergency Medicine

## 2019-09-16 DIAGNOSIS — F1010 Alcohol abuse, uncomplicated: Secondary | ICD-10-CM

## 2019-09-16 DIAGNOSIS — G928 Other toxic encephalopathy: Secondary | ICD-10-CM

## 2019-09-16 DIAGNOSIS — S0990XA Unspecified injury of head, initial encounter: Secondary | ICD-10-CM

## 2019-09-16 LAB — I-STAT BETA HCG BLOOD, ED (MC, WL, AP ONLY): I-stat hCG, quantitative: <5 m[IU]/mL (ref <5)

## 2019-09-16 LAB — COMPREHENSIVE METABOLIC PANEL
ALT: 13 U/L (ref 0–44)
AST: 19 U/L (ref 15–41)
Albumin: 4.1 g/dL (ref 3.5–5.0)
Alkaline Phosphatase: 58 U/L (ref 38–126)
Anion gap: 10 (ref 5–15)
BUN: 9 mg/dL (ref 6–20)
CO2: 21 mmol/L — ABNORMAL LOW (ref 22–32)
Calcium: 8.8 mg/dL — ABNORMAL LOW (ref 8.9–10.3)
Chloride: 108 mmol/L (ref 98–111)
Creatinine, Ser: 0.69 mg/dL (ref 0.44–1.00)
GFR calc Af Amer: >60 mL/min (ref >60)
GFR calc non Af Amer: >60 mL/min (ref >60)
Glucose, Bld: 102 mg/dL — ABNORMAL HIGH (ref 70–99)
Potassium: 3.5 mmol/L (ref 3.5–5.1)
Sodium: 139 mmol/L (ref 135–145)
Total Bilirubin: 0.8 mg/dL (ref 0.3–1.2)
Total Protein: 7.1 g/dL (ref 6.5–8.1)

## 2019-09-16 LAB — DIFFERENTIAL
Abs Immature Granulocytes: 0.03 10*3/uL (ref 0.00–0.07)
Basophils Absolute: 0 10*3/uL (ref 0.0–0.1)
Basophils Relative: 1 %
Eosinophils Absolute: 0 10*3/uL (ref 0.0–0.5)
Eosinophils Relative: 0 %
Immature Granulocytes: 0 %
Lymphocytes Relative: 26 %
Lymphs Abs: 2.3 10*3/uL (ref 0.7–4.0)
Monocytes Absolute: 0.3 10*3/uL (ref 0.1–1.0)
Monocytes Relative: 3 %
Neutro Abs: 6.2 10*3/uL (ref 1.7–7.7)
Neutrophils Relative %: 70 %

## 2019-09-16 LAB — I-STAT CHEM 8, ED
BUN: 10 mg/dL (ref 6–20)
Calcium, Ion: 1.14 mmol/L — ABNORMAL LOW (ref 1.15–1.40)
Chloride: 108 mmol/L (ref 98–111)
Creatinine, Ser: 0.9 mg/dL (ref 0.44–1.00)
Glucose, Bld: 96 mg/dL (ref 70–99)
HCT: 37 % (ref 36.0–46.0)
Hemoglobin: 12.6 g/dL (ref 12.0–15.0)
Potassium: 3.7 mmol/L (ref 3.5–5.1)
Sodium: 144 mmol/L (ref 135–145)
TCO2: 21 mmol/L — ABNORMAL LOW (ref 22–32)

## 2019-09-16 LAB — PROTIME-INR
INR: 1.1 (ref 0.8–1.2)
Prothrombin Time: 13.9 seconds (ref 11.4–15.2)

## 2019-09-16 LAB — RAPID URINE DRUG SCREEN, HOSP PERFORMED
Amphetamines: NOT DETECTED
Barbiturates: NOT DETECTED
Benzodiazepines: NOT DETECTED
Cocaine: NOT DETECTED
Opiates: NOT DETECTED
Tetrahydrocannabinol: POSITIVE — AB

## 2019-09-16 LAB — SALICYLATE LEVEL: Salicylate Lvl: <7 mg/dL — ABNORMAL LOW (ref 7.0–30.0)

## 2019-09-16 LAB — CBC
HCT: 33.2 % — ABNORMAL LOW (ref 36.0–46.0)
Hemoglobin: 10.7 g/dL — ABNORMAL LOW (ref 12.0–15.0)
MCH: 23.2 pg — ABNORMAL LOW (ref 26.0–34.0)
MCHC: 32.2 g/dL (ref 30.0–36.0)
MCV: 72 fL — ABNORMAL LOW (ref 80.0–100.0)
Platelets: 373 10*3/uL (ref 150–400)
RBC: 4.61 MIL/uL (ref 3.87–5.11)
RDW: 18.5 % — ABNORMAL HIGH (ref 11.5–15.5)
WBC: 8.8 10*3/uL (ref 4.0–10.5)
nRBC: 0 % (ref 0.0–0.2)

## 2019-09-16 LAB — ETHANOL: Alcohol, Ethyl (B): 171 mg/dL — ABNORMAL HIGH (ref <10)

## 2019-09-16 LAB — ACETAMINOPHEN LEVEL: Acetaminophen (Tylenol), Serum: <10 ug/mL — ABNORMAL LOW (ref 10–30)

## 2019-09-16 LAB — APTT: aPTT: 28 seconds (ref 24–36)

## 2019-09-16 LAB — CBG MONITORING, ED: Glucose-Capillary: 82 mg/dL (ref 70–99)

## 2019-09-16 MED ORDER — LACTATED RINGERS IV BOLUS
2000.0000 mL | Freq: Once | INTRAVENOUS | Status: AC
  Administered 2019-09-16: 2000 mL via INTRAVENOUS

## 2019-09-16 MED ORDER — ACETAMINOPHEN 500 MG PO TABS
1000.0000 mg | ORAL_TABLET | Freq: Once | ORAL | Status: AC
  Administered 2019-09-16: 1000 mg via ORAL
  Filled 2019-09-16: qty 2

## 2019-09-16 MED ORDER — SODIUM CHLORIDE 0.9% FLUSH: 3.0000 mL | Freq: Once | INTRAVENOUS | Status: DC
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
## 2019-09-16 NOTE — ED Provider Notes (Signed)
Patient signed out to me awaiting metabolizing of alcohol.  Work-up unremarkable except for positive for marijuana and alcohol.  Head CT normal.  Patient given IV fluids.  Patient reevaluated at 930.  She is ambulatory.  She has been able to eat and drink.  Vitals are normal.  Discharged.  Understands return precautions.  This chart was dictated using voice recognition software.  Despite best efforts to proofread,  errors can occur which can change the documentation meaning.     Virgina Norfolk, DO 09/16/19 [phone]
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
## 2019-09-16 NOTE — ED Notes (Signed)
Patient verbalizes understanding of discharge instructions . Opportunity for questions and answers were provided . Armband removed by staff ,Pt discharged from ED. W/C  offered at D/C  and Declined W/C at D/C and was escorted to lobby by RN.  

## 2019-09-16 NOTE — ED Notes (Signed)
PT ambulated with unsteady gait, complained of knee pain and light headedness walking back to PT room.
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
## 2020-01-19 ENCOUNTER — Emergency Department (HOSPITAL_COMMUNITY)
Admission: EM | Admit: 2020-01-19 | Discharge: 2020-01-19 | Disposition: A | Discharge status: Home / Self Care | Payer: Self-pay | Attending: Emergency Medicine | Admitting: Emergency Medicine

## 2020-01-19 ENCOUNTER — Other Ambulatory Visit: Payer: Self-pay

## 2020-01-19 ENCOUNTER — Emergency Department (HOSPITAL_COMMUNITY): Payer: Self-pay

## 2020-01-19 DIAGNOSIS — J45909 Unspecified asthma, uncomplicated: Secondary | ICD-10-CM | POA: Insufficient documentation

## 2020-01-19 DIAGNOSIS — S60419A Abrasion of unspecified finger, initial encounter: Secondary | ICD-10-CM | POA: Insufficient documentation

## 2020-01-19 DIAGNOSIS — M25561 Pain in right knee: Secondary | ICD-10-CM | POA: Insufficient documentation

## 2020-01-19 DIAGNOSIS — S199XXA Unspecified injury of neck, initial encounter: Secondary | ICD-10-CM | POA: Insufficient documentation

## 2020-01-19 DIAGNOSIS — F17210 Nicotine dependence, cigarettes, uncomplicated: Secondary | ICD-10-CM | POA: Insufficient documentation

## 2020-01-19 DIAGNOSIS — S0083XA Contusion of other part of head, initial encounter: Secondary | ICD-10-CM | POA: Insufficient documentation

## 2020-01-19 DIAGNOSIS — T07XXXA Unspecified multiple injuries, initial encounter: Secondary | ICD-10-CM

## 2020-01-19 MED ORDER — IBUPROFEN 800 MG PO TABS: 800.0000 mg | ORAL_TABLET | Freq: Three times a day (TID) | ORAL | 0 refills | Status: DC | PRN

## 2020-01-19 MED ORDER — IBUPROFEN 800 MG PO TABS
800.0000 mg | ORAL_TABLET | Freq: Three times a day (TID) | ORAL | 0 refills | Status: DC | PRN
Start: 2020-01-19 — End: 2020-01-19

## 2020-01-19 MED ORDER — METHOCARBAMOL 500 MG PO TABS: 500.0000 mg | ORAL_TABLET | Freq: Four times a day (QID) | ORAL | 0 refills | Status: DC
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
## 2020-02-17 ENCOUNTER — Other Ambulatory Visit: Payer: Self-pay

## 2020-02-17 ENCOUNTER — Ambulatory Visit (INDEPENDENT_AMBULATORY_CARE_PROVIDER_SITE_OTHER): Payer: Self-pay

## 2020-02-17 ENCOUNTER — Ambulatory Visit
Admission: EM | Admit: 2020-02-17 | Discharge: 2020-02-17 | Disposition: A | Discharge status: Home / Self Care | Payer: Self-pay | Attending: Emergency Medicine | Admitting: Emergency Medicine

## 2020-02-17 DIAGNOSIS — R059 Cough, unspecified: Secondary | ICD-10-CM

## 2020-02-17 DIAGNOSIS — R0602 Shortness of breath: Secondary | ICD-10-CM

## 2020-02-17 DIAGNOSIS — R52 Pain, unspecified: Secondary | ICD-10-CM

## 2020-02-17 DIAGNOSIS — J209 Acute bronchitis, unspecified: Secondary | ICD-10-CM

## 2020-02-17 DIAGNOSIS — N3090 Cystitis, unspecified without hematuria: Secondary | ICD-10-CM | POA: Insufficient documentation

## 2020-02-17 LAB — POCT URINALYSIS DIP (MANUAL ENTRY)
Bilirubin, UA: NEGATIVE
Glucose, UA: NEGATIVE mg/dL
Ketones, POC UA: NEGATIVE mg/dL
Nitrite, UA: POSITIVE — AB
Protein Ur, POC: NEGATIVE mg/dL
Spec Grav, UA: 1.025 (ref 1.010–1.025)
Urobilinogen, UA: 0.2 E.U./dL
pH, UA: 7 (ref 5.0–8.0)

## 2020-02-17 LAB — POCT URINE PREGNANCY: Preg Test, Ur: NEGATIVE

## 2020-02-17 MED ORDER — CEPHALEXIN 500 MG PO CAPS
500.0000 mg | ORAL_CAPSULE | Freq: Two times a day (BID) | ORAL | 0 refills | Status: DC
Start: 2020-02-17 — End: 2020-03-11

## 2020-02-17 MED ORDER — ALBUTEROL SULFATE HFA 108 (90 BASE) MCG/ACT IN AERS: 2.0000 | INHALATION_SPRAY | Freq: Once | RESPIRATORY_TRACT | Status: DC

## 2020-02-17 MED ORDER — PROMETHAZINE-DM 6.25-15 MG/5ML PO SYRP
5.0000 mL | ORAL_SOLUTION | Freq: Four times a day (QID) | ORAL | 0 refills | Status: DC | PRN
Start: 2020-02-17 — End: 2020-03-11

## 2020-02-17 MED ORDER — ALBUTEROL SULFATE HFA 108 (90 BASE) MCG/ACT IN AERS: 1.0000 | INHALATION_SPRAY | Freq: Four times a day (QID) | RESPIRATORY_TRACT | 0 refills | Status: DC | PRN

## 2020-02-17 MED ORDER — DOXYCYCLINE HYCLATE 100 MG PO CAPS
100.0000 mg | ORAL_CAPSULE | Freq: Two times a day (BID) | ORAL | 0 refills | Status: DC
Start: 2020-02-17 — End: 2020-03-11

## 2020-02-17 MED ORDER — LIDOCAINE VISCOUS HCL 2 % MT SOLN
OROMUCOSAL | 0 refills | Status: DC
Start: 2020-02-17 — End: 2020-03-11

## 2020-02-17 NOTE — Discharge Instructions (Signed)
Bronchitis Chest xray negative for pneumonia. Start doxycycline as directed. Albuterol as needed. Cough syrup as needed. Start lidocaine for sore throat, do not eat or drink for the next 40 mins after use as it can stunt your gag reflex. If shortness of breath/cough does not improve after 3-4 days, please call us and let us known.  Urinary tract infection Your urine was positive for an urinary tract infection. Start keflex as directed. Keep hydrated, urine should be clear to pale yellow in color. Monitor for any worsening of symptoms, fever, worsening abdominal pain, nausea/vomiting, flank pain, follow up for reevaluation.

## 2020-02-17 NOTE — ED Triage Notes (Signed)
Pt states she has had a cough, congestion, body aches x 10 days. Pt also states she has asthma and is having trouble deep breathing due to pain. Pt is aox4 and ambulatory.

## 2020-02-17 NOTE — ED Provider Notes (Signed)
[facility]    CSN: [id number] Arrival date & time: 02/17/20  1130      History   Chief Complaint Chief Complaint  Patient presents with  . Cough    x 10 days  . Nasal Congestion    x 10 days  . Generalized Body Aches    x 10 days    HPI Mikhayla Oley Balm Gruetzmacher is a 29 y.o. female.   29 year old female comes in for multiple complaints.  1. URI symptoms x 10 days. Cough, nasal congestion, body aches, chills. Denies abdominal pain, nausea, vomiting, diarrhea. Short of breath, especially with deep breaths with some relief using albuterol. Denies loss of taste/smell. Current every day smoker.    2. Urinary symptoms x few days. Urinary frequency, urgency. Denies dysuria, hematuria. Denies abdominal pain, nausea, vomiting. Denies fever, chills, flank/back pain. Denies vaginal discharge, itching, spotting. LMP 01/14/2020. Sexually active     Past Medical History:  Diagnosis Date  . Anxiety   . Asthma   . Fracture, pelvis closed (HCC)   . Pneumothorax     Patient Active Problem List   Diagnosis Date Noted  . Negative pregnancy test 06/23/2019  . Exposure to STD 01/31/2012  . Asthma 03/31/2011  . Mood disorder (HCC) 03/31/2011    Past Surgical History:  Procedure Laterality Date  . NO PAST SURGERIES      OB History    Gravida  2   Para      Term      Preterm      AB  2   Living        SAB  2   TAB      Ectopic      Multiple      Live Births           Obstetric Comments  Early SABs x 2 as teenager         Home Medications    Prior to Admission medications   Medication Sig Start Date End Date Taking? Authorizing Provider  albuterol (VENTOLIN HFA) 108 (90 Base) MCG/ACT inhaler Inhale 1-2 puffs into the lungs every 6 (six) hours as needed for wheezing or shortness of breath. 02/17/20   Cathie Hoops, Hugh Kamara V, PA-C  cephALEXin (KEFLEX) 500 MG capsule Take 1 capsule (500 mg total) by mouth 2 (two) times daily. 02/17/20   Cathie Hoops, [place] V, PA-C    doxycycline (VIBRAMYCIN) 100 MG capsule Take 1 capsule (100 mg total) by mouth 2 (two) times daily. 02/17/20   Cathie Hoops, Reyana Leisey V, PA-C  ibuprofen (ADVIL) 800 MG tablet Take 1 tablet (800 mg total) by mouth every 8 (eight) hours as needed. 01/19/20   Elson Areas, PA-C  lidocaine (XYLOCAINE) 2 % solution 5-15 mL gargle as needed 02/17/20   Cathie Hoops, Sammantha Mehlhaff V, PA-C  methocarbamol (ROBAXIN) 500 MG tablet Take 1 tablet (500 mg total) by mouth 4 (four) times daily. 01/19/20   Elson Areas, PA-C  promethazine-dextromethorphan (PROMETHAZINE-DM) 6.25-15 MG/5ML syrup Take 5 mLs by mouth 4 (four) times daily as needed for cough. 02/17/20   Belinda Fisher, PA-C  promethazine (PHENERGAN) 25 MG tablet Take 1 tablet (25 mg total) by mouth every 6 (six) hours as needed for nausea or vomiting. 11/20/18 02/18/19  Liberty Handy, PA-C    Family History Family History  Problem Relation Age of Onset  . Cancer Mother        Living, brain tumor    Social History Social History  Tobacco Use  . Smoking status: Current Every Day Smoker    Packs/day: 0.25    Years: 5.00    Pack years: 1.25    Types: Cigarettes  . Smokeless tobacco: Never Used  Vaping Use  . Vaping Use: Never used  Substance Use Topics  . Alcohol use: No  . Drug use: No     Allergies   Patient has no known allergies.   Review of Systems Review of Systems   Physical Exam Triage Vital Signs ED Triage Vitals  Enc Vitals Group     BP 02/17/20 1251 109/71     Pulse Rate 02/17/20 1251 78     Resp 02/17/20 1251 20     Temp 02/17/20 1251 98.4 F (36.9 C)     Temp Source 02/17/20 1251 Oral     SpO2 02/17/20 1251 98 %     Weight --      Height --      Head Circumference --      Peak Flow --      Pain Score 02/17/20 1310 10     Pain Loc --      Pain Edu? --      Excl. in GC? --    No data found.  Updated Vital Signs BP 109/71 (BP Location: Left Arm)   Pulse 78   Temp 98.4 F (36.9 C) (Oral)   Resp 20   LMP 01/14/2020 (Approximate)    SpO2 98%   Physical Exam Constitutional:      General: She is not in acute distress.    Appearance: Normal appearance. She is not ill-appearing, toxic-appearing or diaphoretic.  HENT:     Head: Normocephalic and atraumatic.     Mouth/Throat:     Mouth: Mucous membranes are moist.     Pharynx: Oropharynx is clear. Uvula midline.  Cardiovascular:     Rate and Rhythm: Normal rate and regular rhythm.     Heart sounds: Normal heart sounds. No murmur heard.  No friction rub. No gallop.   Pulmonary:     Effort: Pulmonary effort is normal. No accessory muscle usage, prolonged expiration, respiratory distress or retractions.     Comments: Diffuse inspiratory and expiratory wheezing and rhonchi.  Albuterol 4 puffs x 1: better air movement. Wheezing improved. Still with inspiratory and expiratory rhonchi throughout.  Abdominal:     General: Bowel sounds are normal.     Palpations: Abdomen is soft.     Tenderness: There is no abdominal tenderness. There is no right CVA tenderness, left CVA tenderness, guarding or rebound.  Musculoskeletal:     Cervical back: Normal range of motion and neck supple.  Neurological:     General: No focal deficit present.     Mental Status: She is alert and oriented to person, place, and time.      UC Treatments / Results  Labs (all labs ordered are listed, but only abnormal results are displayed) Labs Reviewed  POCT URINALYSIS DIP (MANUAL ENTRY) - Abnormal; Notable for the following components:      Result Value   Clarity, UA hazy (*)    Blood, UA trace-intact (*)    Nitrite, UA Positive (*)    Leukocytes, UA Small (1+) (*)    All other components within normal limits  URINE CULTURE  POCT URINE PREGNANCY    EKG   Radiology DG Chest 2 View  Result Date: 02/17/2020 CLINICAL DATA:  Cough, body aches, shortness of breath. EXAM: CHEST - 2  VIEW COMPARISON:  Chest x-ray dated September 16, 2019. FINDINGS: The heart size and mediastinal contours are within  normal limits. Both lungs are clear. The visualized skeletal structures are unremarkable. IMPRESSION: No active cardiopulmonary disease. Electronically Signed   By: Obie Dredge M.D.   On: 02/17/2020 14:41    Procedures Procedures (including critical care time)  Medications Ordered in UC Medications  albuterol (VENTOLIN HFA) 108 (90 Base) MCG/ACT inhaler 2 puff (has no administration in time range)    Initial Impression / Assessment and Plan / UC Course  I have reviewed the triage vital signs and the nursing notes.  Pertinent labs & imaging results that were available during my care of the patient were reviewed by me and considered in my medical decision making (see chart for details).    1. Bronchitis CXR without active cardiopulmonary disease. History and exam consistent with bronchitis. Given currently with cystitis as below, will avoid prednisone for a few days. Doxycycline, albuterol as directed. Discussed to call and assess for need of prednisone in 3-4 days. Other symptomatic treatment discussed. Strict return precautions given.  2. Cystitis Urine dipstick with positive leuks, nitrite, blood. Keflex as directed. Push fluids. Return precautions given.  Final Clinical Impressions(s) / UC Diagnoses   Final diagnoses:  Acute bronchitis, unspecified organism  Cystitis    ED Prescriptions    Medication Sig Dispense Auth. Provider   doxycycline (VIBRAMYCIN) 100 MG capsule Take 1 capsule (100 mg total) by mouth 2 (two) times daily. 14 capsule Isacc Turney V, PA-C   cephALEXin (KEFLEX) 500 MG capsule Take 1 capsule (500 mg total) by mouth 2 (two) times daily. 10 capsule Vennela Jutte V, PA-C   lidocaine (XYLOCAINE) 2 % solution 5-15 mL gargle as needed 150 mL Aideen Fenster V, PA-C   promethazine-dextromethorphan (PROMETHAZINE-DM) 6.25-15 MG/5ML syrup Take 5 mLs by mouth 4 (four) times daily as needed for cough. 118 mL Cory Kitt V, PA-C   albuterol (VENTOLIN HFA) 108 (90 Base) MCG/ACT inhaler Inhale  1-2 puffs into the lungs every 6 (six) hours as needed for wheezing or shortness of breath. 18 g Belinda Fisher, PA-C     PDMP not reviewed this encounter.   Belinda Fisher, PA-C 02/17/20 1555

## 2020-02-19 LAB — URINE CULTURE

## 2020-02-20 ENCOUNTER — Telehealth (HOSPITAL_COMMUNITY): Payer: Self-pay | Admitting: Emergency Medicine

## 2020-02-20 MED ORDER — PREDNISONE 10 MG PO TABS
20.0000 mg | ORAL_TABLET | Freq: Two times a day (BID) | ORAL | 0 refills | Status: AC
Start: 2020-02-20 — End: 2020-02-25

## 2020-02-20 NOTE — Telephone Encounter (Signed)
When this RN called to review urine results with patient she asked about a steroid, which she said Amy had mentioned at the time of her visit.  Reviewed note from provider, and it is mentioned.  Reviewed with Dr. Delton See, who gave verbal for prednisone.  Patient updated, verified pharmacy, prescription sent.

## 2020-03-11 ENCOUNTER — Other Ambulatory Visit: Payer: Self-pay

## 2020-03-11 ENCOUNTER — Ambulatory Visit
Admission: RE | Admit: 2020-03-11 | Discharge: 2020-03-11 | Disposition: A | Discharge status: Home / Self Care | Payer: Self-pay | Source: Ambulatory Visit | Attending: Family Medicine | Admitting: Family Medicine

## 2020-03-11 VITALS — BP 119/66 | HR 85 | Temp 98.7°F | Resp 18

## 2020-03-11 DIAGNOSIS — N760 Acute vaginitis: Secondary | ICD-10-CM | POA: Insufficient documentation

## 2020-03-11 LAB — POCT URINALYSIS DIP (MANUAL ENTRY)
Bilirubin, UA: NEGATIVE
Glucose, UA: NEGATIVE mg/dL
Ketones, POC UA: NEGATIVE mg/dL
Leukocytes, UA: NEGATIVE
Nitrite, UA: NEGATIVE
Protein Ur, POC: NEGATIVE mg/dL
Spec Grav, UA: 1.025 (ref 1.010–1.025)
Urobilinogen, UA: 0.2 E.U./dL
pH, UA: 6.5 (ref 5.0–8.0)

## 2020-03-11 LAB — CERVICOVAGINAL ANCILLARY ONLY
Bacterial Vaginitis (gardnerella): POSITIVE — AB
Candida Glabrata: NEGATIVE
Candida Vaginitis: NEGATIVE
Chlamydia: NEGATIVE
Comment: NEGATIVE
Comment: NEGATIVE
Comment: NEGATIVE
Comment: NEGATIVE
Comment: NEGATIVE
Comment: NORMAL
Neisseria Gonorrhea: NEGATIVE
Trichomonas: POSITIVE — AB

## 2020-03-11 LAB — POCT URINE PREGNANCY: Preg Test, Ur: NEGATIVE

## 2020-03-11 MED ORDER — FLUCONAZOLE 150 MG PO TABS: 150.0000 mg | ORAL_TABLET | Freq: Once | ORAL | 2 refills | Status: AC

## 2020-03-11 NOTE — ED Provider Notes (Signed)
[facility]    CSN: [id number] Arrival date & time: 03/11/20  1049      History   Chief Complaint Chief Complaint  Patient presents with  . Dysuria    HPI Victoria Cline is a 29 y.o. female.   Established [facility] patient  Pt here for dysuria x 4 days; pt wants STD testing and pregnancy test; pt LMP was 01/15/2020.  She is monogamous  Recently treated with antibiotics for bronchitis.  C/o tingling in vaginal area and slight terminal dysuria.  No fever.  Feels some back pain and "ovary" pain.     Past Medical History:  Diagnosis Date  . Anxiety   . Asthma   . Fracture, pelvis closed (HCC)   . Pneumothorax     Patient Active Problem List   Diagnosis Date Noted  . Negative pregnancy test 06/23/2019  . Exposure to STD 01/31/2012  . Asthma 03/31/2011  . Mood disorder (HCC) 03/31/2011    Past Surgical History:  Procedure Laterality Date  . NO PAST SURGERIES      OB History    Gravida  2   Para      Term      Preterm      AB  2   Living        SAB  2   TAB      Ectopic      Multiple      Live Births           Obstetric Comments  Early SABs x 2 as teenager         Home Medications    Prior to Admission medications   Medication Sig Start Date End Date Taking? Authorizing Provider  albuterol (VENTOLIN HFA) 108 (90 Base) MCG/ACT inhaler Inhale 1-2 puffs into the lungs every 6 (six) hours as needed for wheezing or shortness of breath. 02/17/20   Cathie Hoops, Amy V, PA-C  fluconazole (DIFLUCAN) 150 MG tablet Take 1 tablet (150 mg total) by mouth once for 1 dose. Repeat if needed 03/11/20 03/11/20  Elvina Sidle, MD  promethazine (PHENERGAN) 25 MG tablet Take 1 tablet (25 mg total) by mouth every 6 (six) hours as needed for nausea or vomiting. 11/20/18 02/18/19  Liberty Handy, PA-C    Family History Family History  Problem Relation Age of Onset  . Cancer Mother        Living, brain tumor    Social History Social History    Tobacco Use  . Smoking status: Current Every Day Smoker    Packs/day: 0.25    Years: 5.00    Pack years: 1.25    Types: Cigarettes  . Smokeless tobacco: Never Used  Vaping Use  . Vaping Use: Never used  Substance Use Topics  . Alcohol use: No  . Drug use: No     Allergies   Patient has no known allergies.   Review of Systems Review of Systems   Physical Exam Triage Vital Signs ED Triage Vitals [03/11/20 1104]  Enc Vitals Group     BP 119/66     Pulse Rate 85     Resp 18     Temp 98.7 F (37.1 C)     Temp Source Oral     SpO2 100 %     Weight      Height      Head Circumference      Peak Flow      Pain Score 2  Pain Loc      Pain Edu?      Excl. in GC?    No data found.  Updated Vital Signs BP 119/66 (BP Location: Left Arm)   Pulse 85   Temp 98.7 F (37.1 C) (Oral)   Resp 18   SpO2 100%    Physical Exam Vitals and nursing note reviewed.  Constitutional:      General: She is not in acute distress.    Appearance: Normal appearance. She is normal weight.  HENT:     Head: Normocephalic.     Mouth/Throat:     Pharynx: Oropharynx is clear.  Eyes:     Conjunctiva/sclera: Conjunctivae normal.  Pulmonary:     Effort: Pulmonary effort is normal.  Musculoskeletal:        General: Normal range of motion.     Cervical back: Normal range of motion and neck supple.  Skin:    General: Skin is warm and dry.  Neurological:     General: No focal deficit present.     Mental Status: She is alert and oriented to person, place, and time.  Psychiatric:        Mood and Affect: Mood normal.        Behavior: Behavior normal.        Thought Content: Thought content normal.        Judgment: Judgment normal.      UC Treatments / Results  Labs (all labs ordered are listed, but only abnormal results are displayed) Labs Reviewed  POCT URINALYSIS DIP (MANUAL ENTRY) - Abnormal; Notable for the following components:      Result Value   Blood, UA  trace-intact (*)    All other components within normal limits  POCT URINE PREGNANCY  CERVICOVAGINAL ANCILLARY ONLY    EKG   Radiology No results found.  Procedures Procedures (including critical care time)  Medications Ordered in UC Medications - No data to display  Initial Impression / Assessment and Plan / UC Course  I have reviewed the triage vital signs and the nursing notes.  Pertinent labs & imaging results that were available during my care of the patient were reviewed by me and considered in my medical decision making (see chart for details).    Final Clinical Impressions(s) / UC Diagnoses   Final diagnoses:  Vaginitis and vulvovaginitis   Discharge Instructions   None    ED Prescriptions    Medication Sig Dispense Auth. Provider   fluconazole (DIFLUCAN) 150 MG tablet Take 1 tablet (150 mg total) by mouth once for 1 dose. Repeat if needed 2 tablet Elvina Sidle, MD     I have reviewed the PDMP during this encounter.   Elvina Sidle, MD 03/11/20 1118

## 2020-03-11 NOTE — ED Triage Notes (Signed)
Pt here for dysuria x 4 days; pt wants STD testing and pregnancy test; pt LMP was 01/15/2020

## 2020-03-12 ENCOUNTER — Telehealth (HOSPITAL_COMMUNITY): Payer: Self-pay | Admitting: Emergency Medicine

## 2020-03-12 MED ORDER — METRONIDAZOLE 500 MG PO TABS: 500.0000 mg | ORAL_TABLET | Freq: Two times a day (BID) | ORAL | 0 refills | Status: DC

## 2020-03-24 ENCOUNTER — Ambulatory Visit: Payer: Self-pay

## 2020-04-13 ENCOUNTER — Other Ambulatory Visit: Payer: Self-pay

## 2020-04-13 ENCOUNTER — Emergency Department (HOSPITAL_COMMUNITY)
Admission: EM | Admit: 2020-04-13 | Discharge: 2020-04-13 | Disposition: A | Discharge status: Home / Self Care | Payer: Self-pay | Attending: Emergency Medicine | Admitting: Emergency Medicine

## 2020-04-13 ENCOUNTER — Encounter (HOSPITAL_COMMUNITY): Payer: Self-pay | Admitting: Emergency Medicine

## 2020-04-13 DIAGNOSIS — R109 Unspecified abdominal pain: Secondary | ICD-10-CM | POA: Insufficient documentation

## 2020-04-13 DIAGNOSIS — Z5321 Procedure and treatment not carried out due to patient leaving prior to being seen by health care provider: Secondary | ICD-10-CM | POA: Insufficient documentation

## 2020-04-13 DIAGNOSIS — R112 Nausea with vomiting, unspecified: Secondary | ICD-10-CM | POA: Insufficient documentation

## 2020-04-13 LAB — CBC
HCT: 33.9 % — ABNORMAL LOW (ref 36.0–46.0)
Hemoglobin: 11.1 g/dL — ABNORMAL LOW (ref 12.0–15.0)
MCH: 24.3 pg — ABNORMAL LOW (ref 26.0–34.0)
MCHC: 32.7 g/dL (ref 30.0–36.0)
MCV: 74.3 fL — ABNORMAL LOW (ref 80.0–100.0)
Platelets: 333 10*3/uL (ref 150–400)
RBC: 4.56 MIL/uL (ref 3.87–5.11)
RDW: 18.4 % — ABNORMAL HIGH (ref 11.5–15.5)
WBC: 10.8 10*3/uL — ABNORMAL HIGH (ref 4.0–10.5)
nRBC: 0 % (ref 0.0–0.2)

## 2020-04-13 LAB — COMPREHENSIVE METABOLIC PANEL
ALT: 17 U/L (ref 0–44)
AST: 24 U/L (ref 15–41)
Albumin: 4.3 g/dL (ref 3.5–5.0)
Alkaline Phosphatase: 55 U/L (ref 38–126)
Anion gap: 17 — ABNORMAL HIGH (ref 5–15)
BUN: 11 mg/dL (ref 6–20)
CO2: 16 mmol/L — ABNORMAL LOW (ref 22–32)
Calcium: 9.2 mg/dL (ref 8.9–10.3)
Chloride: 111 mmol/L (ref 98–111)
Creatinine, Ser: 0.68 mg/dL (ref 0.44–1.00)
GFR, Estimated: >60 mL/min (ref >60)
Glucose, Bld: 77 mg/dL (ref 70–99)
Potassium: 3.6 mmol/L (ref 3.5–5.1)
Sodium: 144 mmol/L (ref 135–145)
Total Bilirubin: 1.2 mg/dL (ref 0.3–1.2)
Total Protein: 7.3 g/dL (ref 6.5–8.1)

## 2020-04-13 LAB — I-STAT BETA HCG BLOOD, ED (MC, WL, AP ONLY): I-stat hCG, quantitative: <5 m[IU]/mL (ref <5)

## 2020-04-13 LAB — LIPASE, BLOOD: Lipase: 25 U/L (ref 11–51)

## 2020-04-13 MED ORDER — ONDANSETRON 4 MG PO TBDP
4.0000 mg | ORAL_TABLET | Freq: Once | ORAL | Status: AC | PRN
  Administered 2020-04-13: 4 mg via ORAL
  Filled 2020-04-13: qty 1

## 2020-04-13 NOTE — ED Triage Notes (Signed)
Pt c/o abdominal pain, nausea and vomiting that started this morning. Denies urinary symptoms.

## 2020-04-13 NOTE — ED Notes (Signed)
No answer

## 2020-04-13 NOTE — ED Notes (Signed)
Pt no longer in waiting room or outside waiting.

## 2020-04-26 ENCOUNTER — Ambulatory Visit
Admission: RE | Admit: 2020-04-26 | Discharge: 2020-04-26 | Disposition: A | Discharge status: Home / Self Care | Payer: Self-pay | Source: Ambulatory Visit | Attending: Emergency Medicine | Admitting: Emergency Medicine

## 2020-04-26 ENCOUNTER — Other Ambulatory Visit: Payer: Self-pay

## 2020-04-26 VITALS — BP 108/49 | HR 86 | Temp 98.2°F | Resp 16

## 2020-04-26 DIAGNOSIS — Z113 Encounter for screening for infections with a predominantly sexual mode of transmission: Secondary | ICD-10-CM | POA: Insufficient documentation

## 2020-04-26 DIAGNOSIS — Z20822 Contact with and (suspected) exposure to covid-19: Secondary | ICD-10-CM | POA: Insufficient documentation

## 2020-04-26 DIAGNOSIS — J069 Acute upper respiratory infection, unspecified: Secondary | ICD-10-CM | POA: Insufficient documentation

## 2020-04-26 LAB — POCT URINE PREGNANCY: Preg Test, Ur: NEGATIVE

## 2020-04-26 MED ORDER — AEROCHAMBER PLUS FLO-VU MEDIUM MISC: 1.0000 | Freq: Once | 0 refills | Status: AC

## 2020-04-26 MED ORDER — PREDNISONE 20 MG PO TABS: 20.0000 mg | ORAL_TABLET | Freq: Every day | ORAL | 0 refills | Status: DC

## 2020-04-26 MED ORDER — BENZONATATE 100 MG PO CAPS: 100.0000 mg | ORAL_CAPSULE | Freq: Three times a day (TID) | ORAL | 0 refills | Status: DC

## 2020-04-26 MED ORDER — ALBUTEROL SULFATE HFA 108 (90 BASE) MCG/ACT IN AERS
1.0000 | INHALATION_SPRAY | Freq: Four times a day (QID) | RESPIRATORY_TRACT | 0 refills | Status: DC | PRN
Start: 2020-04-26 — End: 2020-10-07

## 2020-04-26 MED ORDER — FLUCONAZOLE 200 MG PO TABS: 200.0000 mg | ORAL_TABLET | Freq: Once | ORAL | 0 refills | Status: AC

## 2020-04-26 NOTE — Discharge Instructions (Signed)
Today you received treatment for .esting for chlamydia, gonorrhea, trichomonas is pending: please look for these results on the MyChart app/website.  We will notify you if you are positive and outline treatment at that time.  Important to avoid all forms of sexual intercourse (oral, vaginal, anal) with any/all partners for the next 7 days to avoid spreading/reinfecting. Any/all sexual partners should be notified of testing/treatment today.  Return for persistent/worsening symptoms or if you develop fever, abdominal or pelvic pain, discharge, genital pain, blood in your urine, or are re-exposed to an STI.

## 2020-04-26 NOTE — ED Triage Notes (Signed)
Pt c/o cough for over a week with mild body aches. Pt c/o white vaginal discharge with no odor x1wk. States has had un protective intercourse. Denies urinary sx's.

## 2020-04-26 NOTE — ED Provider Notes (Signed)
[facility]    CSN: [id number] Arrival date & time: 04/26/20  1653      History   Chief Complaint Chief Complaint  Patient presents with  . appt 5  . Cough       . Vaginal Discharge    HPI Victoria Cline is a 30 y.o. female  Today for URI symptoms x1 week.  Patient endorsing cough, mild myalgias.  Requesting COVID testing, though denies known exposures. Patient also noting white vaginal discharge without odor x1 week.  Endorses history of unprotected intercourse: No urinary symptoms or known exposure.  Past Medical History:  Diagnosis Date  . Anxiety   . Asthma   . Fracture, pelvis closed (HCC)   . Pneumothorax     Patient Active Problem List   Diagnosis Date Noted  . Negative pregnancy test 06/23/2019  . Exposure to STD 01/31/2012  . Asthma 03/31/2011  . Mood disorder (HCC) 03/31/2011    Past Surgical History:  Procedure Laterality Date  . NO PAST SURGERIES      OB History    Gravida  2   Para      Term      Preterm      AB  2   Living        SAB  2   IAB      Ectopic      Multiple      Live Births           Obstetric Comments  Early SABs x 2 as teenager         Home Medications    Prior to Admission medications   Medication Sig Start Date End Date Taking? Authorizing Provider  benzonatate (TESSALON) 100 MG capsule Take 1 capsule (100 mg total) by mouth every 8 (eight) hours. 04/26/20  Yes Hall-Potvin, [place], PA-C  fluconazole (DIFLUCAN) 200 MG tablet Take 1 tablet (200 mg total) by mouth once for 1 dose. May repeat in 72 hours if needed 04/26/20 04/26/20 Yes Hall-Potvin, [place], PA-C  predniSONE (DELTASONE) 20 MG tablet Take 1 tablet (20 mg total) by mouth daily. 04/26/20  Yes Hall-Potvin, [place], PA-C  Spacer/Aero-Holding Chambers (AEROCHAMBER PLUS FLO-VU MEDIUM) MISC 1 each by Other route once for 1 dose. 04/26/20 04/26/20 Yes Hall-Potvin, [place], PA-C  albuterol (VENTOLIN HFA) 108 (90 Base) MCG/ACT  inhaler Inhale 1-2 puffs into the lungs every 6 (six) hours as needed for wheezing or shortness of breath. 04/26/20   Hall-Potvin, [place], PA-C  promethazine (PHENERGAN) 25 MG tablet Take 1 tablet (25 mg total) by mouth every 6 (six) hours as needed for nausea or vomiting. 11/20/18 02/18/19  Liberty Handy, PA-C    Family History Family History  Problem Relation Age of Onset  . Cancer Mother        Living, brain tumor    Social History Social History   Tobacco Use  . Smoking status: Current Every Day Smoker    Packs/day: 0.25    Years: 5.00    Pack years: 1.25    Types: Cigarettes  . Smokeless tobacco: Never Used  Vaping Use  . Vaping Use: Never used  Substance Use Topics  . Alcohol use: No  . Drug use: No     Allergies   Patient has no known allergies.   Review of Systems Review of Systems  Constitutional: Negative for fatigue and fever.  HENT: Negative for congestion, dental problem, ear pain, facial swelling, hearing loss, sinus pain, sore throat, trouble  swallowing and voice change.   Eyes: Negative for photophobia, pain and visual disturbance.  Respiratory: Positive for cough. Negative for shortness of breath and wheezing.   Cardiovascular: Negative for chest pain and palpitations.  Gastrointestinal: Negative for diarrhea and vomiting.  Genitourinary: Positive for vaginal discharge. Negative for dysuria, frequency and pelvic pain.  Musculoskeletal: Negative for arthralgias and myalgias.  Neurological: Negative for dizziness and headaches.     Physical Exam Triage Vital Signs ED Triage Vitals  Enc Vitals Group     BP 04/26/20 1745 (!) 108/49     Pulse Rate 04/26/20 1745 86     Resp 04/26/20 1745 16     Temp 04/26/20 1745 98.2 F (36.8 C)     Temp Source 04/26/20 1745 Oral     SpO2 04/26/20 1745 98 %     Weight --      Height --      Head Circumference --      Peak Flow --      Pain Score 04/26/20 1757 7     Pain Loc --      Pain Edu? --       Excl. in GC? --    No data found.  Updated Vital Signs BP (!) 108/49   Pulse 86   Temp 98.2 F (36.8 C) (Oral)   Resp 16   LMP 04/11/2020   SpO2 98%   Visual Acuity Right Eye Distance:   Left Eye Distance:   Bilateral Distance:    Right Eye Near:   Left Eye Near:    Bilateral Near:     Physical Exam Constitutional:      General: She is not in acute distress.    Appearance: She is not ill-appearing or diaphoretic.  HENT:     Head: Normocephalic and atraumatic.     Right Ear: Tympanic membrane and ear canal normal.     Left Ear: Tympanic membrane and ear canal normal.     Mouth/Throat:     Mouth: Mucous membranes are moist.     Pharynx: Oropharynx is clear. No oropharyngeal exudate or posterior oropharyngeal erythema.  Eyes:     General: No scleral icterus.    Conjunctiva/sclera: Conjunctivae normal.     Pupils: Pupils are equal, round, and reactive to light.  Neck:     Comments: Trachea midline, negative JVD Cardiovascular:     Rate and Rhythm: Normal rate and regular rhythm.     Heart sounds: No murmur heard. No gallop.   Pulmonary:     Effort: Pulmonary effort is normal. No respiratory distress.     Breath sounds: No wheezing, rhonchi or rales.  Musculoskeletal:     Cervical back: Neck supple. No tenderness.  Lymphadenopathy:     Cervical: No cervical adenopathy.  Skin:    Capillary Refill: Capillary refill takes less than 2 seconds.     Coloration: Skin is not jaundiced or pale.     Findings: No rash.  Neurological:     General: No focal deficit present.     Mental Status: She is alert and oriented to person, place, and time.      UC Treatments / Results  Labs (all labs ordered are listed, but only abnormal results are displayed) Labs Reviewed  NOVEL CORONAVIRUS, NAA  POCT URINE PREGNANCY  CERVICOVAGINAL ANCILLARY ONLY    EKG   Radiology No results found.  Procedures Procedures (including critical care time)  Medications Ordered in  UC Medications - No data to  display  Initial Impression / Assessment and Plan / UC Course  I have reviewed the triage vital signs and the nursing notes.  Pertinent labs & imaging results that were available during my care of the patient were reviewed by me and considered in my medical decision making (see chart for details).      Patient afebrile, nontoxic, with SpO2 98%.  Covid PCR pending.  Patient to quarantine until results are back.  We will treat supportively as outlined below.  Cytology pending: We will treat for staph indicated..  Return precautions discussed, patient verbalized understanding and is agreeable to plan. Final Clinical Impressions(s) / UC Diagnoses   Final diagnoses:  Encounter for screening laboratory testing for COVID-19 virus  URI with cough and congestion  Screening examination for venereal disease     Discharge Instructions     Today you received treatment for .esting for chlamydia, gonorrhea, trichomonas is pending: please look for these results on the MyChart app/website.  We will notify you if you are positive and outline treatment at that time.  Important to avoid all forms of sexual intercourse (oral, vaginal, anal) with any/all partners for the next 7 days to avoid spreading/reinfecting. Any/all sexual partners should be notified of testing/treatment today.  Return for persistent/worsening symptoms or if you develop fever, abdominal or pelvic pain, discharge, genital pain, blood in your urine, or are re-exposed to an STI.    ED Prescriptions    Medication Sig Dispense Auth. Provider   albuterol (VENTOLIN HFA) 108 (90 Base) MCG/ACT inhaler Inhale 1-2 puffs into the lungs every 6 (six) hours as needed for wheezing or shortness of breath. 18 g Hall-Potvin, [place], PA-C   Spacer/Aero-Holding Chambers (AEROCHAMBER PLUS FLO-VU MEDIUM) MISC 1 each by Other route once for 1 dose. 1 each Hall-Potvin, [place], PA-C   benzonatate (TESSALON) 100 MG capsule  Take 1 capsule (100 mg total) by mouth every 8 (eight) hours. 21 capsule Hall-Potvin, [place], PA-C   predniSONE (DELTASONE) 20 MG tablet Take 1 tablet (20 mg total) by mouth daily. 5 tablet Hall-Potvin, [place], PA-C   fluconazole (DIFLUCAN) 200 MG tablet Take 1 tablet (200 mg total) by mouth once for 1 dose. May repeat in 72 hours if needed 2 tablet Hall-Potvin, [place], PA-C     PDMP not reviewed this encounter.   Odette Fraction [place], [place] 04/26/20 1909

## 2020-04-29 LAB — CERVICOVAGINAL ANCILLARY ONLY
Chlamydia: NEGATIVE
Comment: NEGATIVE
Comment: NEGATIVE
Comment: NORMAL
Neisseria Gonorrhea: NEGATIVE
Trichomonas: NEGATIVE

## 2020-04-29 LAB — NOVEL CORONAVIRUS, NAA: SARS-CoV-2, NAA: DETECTED — AB

## 2020-07-07 ENCOUNTER — Ambulatory Visit
Admission: EM | Admit: 2020-07-07 | Discharge: 2020-07-07 | Disposition: A | Discharge status: Home / Self Care | Payer: Self-pay

## 2020-07-07 ENCOUNTER — Other Ambulatory Visit: Payer: Self-pay

## 2020-07-07 ENCOUNTER — Encounter (HOSPITAL_COMMUNITY): Payer: Self-pay | Admitting: Emergency Medicine

## 2020-07-07 ENCOUNTER — Emergency Department (HOSPITAL_COMMUNITY)
Admission: EM | Admit: 2020-07-07 | Discharge: 2020-07-07 | Disposition: A | Discharge status: Home / Self Care | Payer: Self-pay | Attending: Emergency Medicine | Admitting: Emergency Medicine

## 2020-07-07 DIAGNOSIS — F17210 Nicotine dependence, cigarettes, uncomplicated: Secondary | ICD-10-CM | POA: Insufficient documentation

## 2020-07-07 DIAGNOSIS — R1084 Generalized abdominal pain: Secondary | ICD-10-CM | POA: Insufficient documentation

## 2020-07-07 DIAGNOSIS — R112 Nausea with vomiting, unspecified: Secondary | ICD-10-CM

## 2020-07-07 DIAGNOSIS — R109 Unspecified abdominal pain: Secondary | ICD-10-CM

## 2020-07-07 DIAGNOSIS — J45909 Unspecified asthma, uncomplicated: Secondary | ICD-10-CM | POA: Insufficient documentation

## 2020-07-07 LAB — CBC
HCT: 31.7 % — ABNORMAL LOW (ref 36.0–46.0)
Hemoglobin: 10.3 g/dL — ABNORMAL LOW (ref 12.0–15.0)
MCH: 24.6 pg — ABNORMAL LOW (ref 26.0–34.0)
MCHC: 32.5 g/dL (ref 30.0–36.0)
MCV: 75.7 fL — ABNORMAL LOW (ref 80.0–100.0)
Platelets: 305 10*3/uL (ref 150–400)
RBC: 4.19 MIL/uL (ref 3.87–5.11)
RDW: 16.6 % — ABNORMAL HIGH (ref 11.5–15.5)
WBC: 11.1 10*3/uL — ABNORMAL HIGH (ref 4.0–10.5)
nRBC: 0 % (ref 0.0–0.2)

## 2020-07-07 LAB — COMPREHENSIVE METABOLIC PANEL
ALT: 18 U/L (ref 0–44)
AST: 26 U/L (ref 15–41)
Albumin: 4.5 g/dL (ref 3.5–5.0)
Alkaline Phosphatase: 43 U/L (ref 38–126)
Anion gap: 12 (ref 5–15)
BUN: 14 mg/dL (ref 6–20)
CO2: 21 mmol/L — ABNORMAL LOW (ref 22–32)
Calcium: 8.9 mg/dL (ref 8.9–10.3)
Chloride: 110 mmol/L (ref 98–111)
Creatinine, Ser: 0.43 mg/dL — ABNORMAL LOW (ref 0.44–1.00)
GFR, Estimated: >60 mL/min (ref >60)
Glucose, Bld: 101 mg/dL — ABNORMAL HIGH (ref 70–99)
Potassium: 3.4 mmol/L — ABNORMAL LOW (ref 3.5–5.1)
Sodium: 143 mmol/L (ref 135–145)
Total Bilirubin: 0.5 mg/dL (ref 0.3–1.2)
Total Protein: 7.9 g/dL (ref 6.5–8.1)

## 2020-07-07 LAB — URINALYSIS, ROUTINE W REFLEX MICROSCOPIC
Bacteria, UA: NONE SEEN
Bilirubin Urine: NEGATIVE
Glucose, UA: NEGATIVE mg/dL
Hgb urine dipstick: NEGATIVE
Ketones, ur: 80 mg/dL — AB
Leukocytes,Ua: NEGATIVE
Nitrite: NEGATIVE
Protein, ur: 100 mg/dL — AB
Specific Gravity, Urine: 1.029 (ref 1.005–1.030)
pH: 6 (ref 5.0–8.0)

## 2020-07-07 LAB — I-STAT BETA HCG BLOOD, ED (MC, WL, AP ONLY): I-stat hCG, quantitative: <5 m[IU]/mL (ref <5)

## 2020-07-07 LAB — LIPASE, BLOOD: Lipase: 23 U/L (ref 11–51)

## 2020-07-07 MED ORDER — ONDANSETRON 4 MG PO TBDP: 4.0000 mg | ORAL_TABLET | Freq: Three times a day (TID) | ORAL | 0 refills | Status: DC | PRN

## 2020-07-07 MED ORDER — ONDANSETRON HCL 4 MG/2ML IJ SOLN
4.0000 mg | Freq: Once | INTRAMUSCULAR | Status: AC
  Administered 2020-07-07: 4 mg via INTRAVENOUS
  Filled 2020-07-07: qty 2

## 2020-07-07 MED ORDER — SODIUM CHLORIDE 0.9 % IV BOLUS
1000.0000 mL | Freq: Once | INTRAVENOUS | Status: AC
  Administered 2020-07-07: 1000 mL via INTRAVENOUS

## 2020-07-07 MED ORDER — ONDANSETRON 4 MG PO TBDP: 4.0000 mg | ORAL_TABLET | Freq: Once | ORAL | Status: DC | PRN

## 2020-07-07 MED ORDER — FENTANYL CITRATE (PF) 100 MCG/2ML IJ SOLN
50.0000 ug | Freq: Once | INTRAMUSCULAR | Status: AC
  Administered 2020-07-07: 50 ug via INTRAVENOUS
  Filled 2020-07-07: qty 2

## 2020-07-07 MED ORDER — HALOPERIDOL LACTATE 5 MG/ML IJ SOLN
2.0000 mg | Freq: Once | INTRAMUSCULAR | Status: AC
  Administered 2020-07-07: 2 mg via INTRAVENOUS
  Filled 2020-07-07: qty 1

## 2020-07-07 NOTE — ED Provider Notes (Signed)
[facility]-EMERGENCY DEPT Provider Note   CSN: [id number] Arrival date & time: 07/07/20  [id number]     History Chief Complaint  Patient presents with  . Abdominal Pain  . Nausea  . Emesis    Victoria Cline is a 30 y.o. female possible history of anxiety, asthma brought in for evaluation of generalized abdominal pain, nausea/vomiting that began about the a.m. last night.  She reports that since then, she has not been able tolerate any p.o.  She states that she had an episode here in emergency department where she seemed to have some blood in her vomit.  She states that has since resolved.  She reports that she ate [place] food last night and drink 1 margarita.  She reports that nobody else that she was with that ate the food got sick.  She was feeling fine yesterday prior to onset of symptoms.  No fevers.  No diarrhea.  Her last menstrual cycle was about a week ago.  Patient denies any chest pain, difficulty breathing, fevers, dysuria, hematuria.  The history is provided by the patient.       Past Medical History:  Diagnosis Date  . Anxiety   . Asthma   . Fracture, pelvis closed (HCC)   . Pneumothorax     Patient Active Problem List   Diagnosis Date Noted  . Negative pregnancy test 06/23/2019  . Exposure to STD 01/31/2012  . Asthma 03/31/2011  . Mood disorder (HCC) 03/31/2011    Past Surgical History:  Procedure Laterality Date  . NO PAST SURGERIES       OB History    Gravida  2   Para      Term      Preterm      AB  2   Living        SAB  2   IAB      Ectopic      Multiple      Live Births           Obstetric Comments  Early SABs x 2 as teenager        Family History  Problem Relation Age of Onset  . Cancer Mother        Living, brain tumor    Social History   Tobacco Use  . Smoking status: Current Every Day Smoker    Packs/day: 0.25    Years: 5.00    Pack years: 1.25    Types: Cigarettes  . Smokeless  tobacco: Never Used  Vaping Use  . Vaping Use: Never used  Substance Use Topics  . Alcohol use: No  . Drug use: No    Home Medications Prior to Admission medications   Medication Sig Start Date End Date Taking? Authorizing Provider  ondansetron (ZOFRAN ODT) 4 MG disintegrating tablet Take 1 tablet (4 mg total) by mouth every 8 (eight) hours as needed for nausea or vomiting. 07/07/20  Yes Graciella FreerLayden, Chanika Byland A, PA-C  albuterol (VENTOLIN HFA) 108 (90 Base) MCG/ACT inhaler Inhale 1-2 puffs into the lungs every 6 (six) hours as needed for wheezing or shortness of breath. Patient not taking: Reported on 07/07/2020 04/26/20   Hall-Potvin, [place], PA-C  benzonatate (TESSALON) 100 MG capsule Take 1 capsule (100 mg total) by mouth every 8 (eight) hours. Patient not taking: Reported on 07/07/2020 04/26/20   Hall-Potvin, [place], PA-C  predniSONE (DELTASONE) 20 MG tablet Take 1 tablet (20 mg total) by mouth daily. Patient not taking: Reported on  07/07/2020 04/26/20   Hall-Potvin, [place], PA-C  promethazine (PHENERGAN) 25 MG tablet Take 1 tablet (25 mg total) by mouth every 6 (six) hours as needed for nausea or vomiting. 11/20/18 02/18/19  Liberty Handy, PA-C    Allergies    Patient has no known allergies.  Review of Systems   Review of Systems  Constitutional: Negative for fever.  Respiratory: Negative for cough and shortness of breath.   Cardiovascular: Negative for chest pain.  Gastrointestinal: Positive for abdominal pain, nausea and vomiting.  Genitourinary: Negative for dysuria and hematuria.  Neurological: Negative for headaches.  All other systems reviewed and are negative.   Physical Exam Updated Vital Signs BP (!) 99/58 (BP Location: Right Arm)   Pulse 63   Temp 98.9 F (37.2 C) (Oral)   Resp 14   SpO2 100%   Physical Exam Vitals and nursing note reviewed.  Constitutional:      Appearance: Normal appearance. She is well-developed.  HENT:     Head: Normocephalic and  atraumatic.  Eyes:     General: Lids are normal.     Conjunctiva/sclera: Conjunctivae normal.     Pupils: Pupils are equal, round, and reactive to light.  Cardiovascular:     Rate and Rhythm: Normal rate and regular rhythm.     Pulses: Normal pulses.     Heart sounds: Normal heart sounds. No murmur heard. No friction rub. No gallop.   Pulmonary:     Effort: Pulmonary effort is normal.     Breath sounds: Normal breath sounds.     Comments: Lungs clear to auscultation bilaterally.  Symmetric chest rise.  No wheezing, rales, rhonchi. Abdominal:     Palpations: Abdomen is soft. Abdomen is not rigid.     Tenderness: There is generalized abdominal tenderness. There is no guarding.     Comments: Abdomen is soft, non-distended. Diffuse tenderness with no focal point. No rigidity, guarding. No CVA tenderness noted bilaterally.   Musculoskeletal:        General: Normal range of motion.     Cervical back: Full passive range of motion without pain.  Skin:    General: Skin is warm and dry.     Capillary Refill: Capillary refill takes less than 2 seconds.  Neurological:     Mental Status: She is alert and oriented to person, place, and time.  Psychiatric:        Speech: Speech normal.     ED Results / Procedures / Treatments   Labs (all labs ordered are listed, but only abnormal results are displayed) Labs Reviewed  COMPREHENSIVE METABOLIC PANEL - Abnormal; Notable for the following components:      Result Value   Potassium 3.4 (*)    CO2 21 (*)    Glucose, Bld 101 (*)    Creatinine, Ser 0.43 (*)    All other components within normal limits  CBC - Abnormal; Notable for the following components:   WBC 11.1 (*)    Hemoglobin 10.3 (*)    HCT 31.7 (*)    MCV 75.7 (*)    MCH 24.6 (*)    RDW 16.6 (*)    All other components within normal limits  URINALYSIS, ROUTINE W REFLEX MICROSCOPIC - Abnormal; Notable for the following components:   Ketones, ur 80 (*)    Protein, ur 100 (*)     All other components within normal limits  LIPASE, BLOOD  I-STAT BETA HCG BLOOD, ED ([facility], [facility], [facility] ONLY)    EKG  None  Radiology No results found.  Procedures Procedures   Medications Ordered in ED Medications  ondansetron (ZOFRAN-ODT) disintegrating tablet 4 mg (has no administration in time range)  sodium chloride 0.9 % bolus 1,000 mL (0 mLs Intravenous Stopped 07/07/20 1413)  ondansetron (ZOFRAN) injection 4 mg (4 mg Intravenous Given 07/07/20 1221)  fentaNYL (SUBLIMAZE) injection 50 mcg (50 mcg Intravenous Given 07/07/20 1222)  haloperidol lactate (HALDOL) injection 2 mg (2 mg Intravenous Given 07/07/20 1411)  sodium chloride 0.9 % bolus 1,000 mL (0 mLs Intravenous Stopped 07/07/20 1705)  ondansetron (ZOFRAN) injection 4 mg (4 mg Intravenous Given 07/07/20 1705)    ED Course  I have reviewed the triage vital signs and the nursing notes.  Pertinent labs & imaging results that were available during my care of the patient were reviewed by me and considered in my medical decision making (see chart for details).    MDM Rules/Calculators/A&P                          30 year old female who presents for evaluation of abdominal pain, nausea/vomiting that began at 9 PM last night.  Reports eating [place] food as well as margarita last night prior to onset of symptoms.  Has not been able tolerate any p.o.  No fevers.  Initially arrival, she is afebrile, appears uncomfortable but no acute distress.  Vital signs are stable.  On exam, she has generalized abdominal tenderness.  No focal point.  Concern for viral process versus foodborne illness.  Do not suspect appendicitis, diverticulitis, ovarian torsion.  Plan for labs, fluids, antiemetics.  CMP shows potassium of 3.4, CO2 of 21. Normal BUN/Cr.  CBC shows slight leukocytosis of 11.1.  Hemoglobin is 10.3.  I-STAT beta is negative.  UA shows small ketones.  No infectious etiology.  Patient still having some nausea.  She is requesting additional  analgesics.  Reevaluation after additional analgesics.  Patient reports improvement in pain, nausea/vomiting.  We will plan to p.o. challenge.  Reevaluation.  Patient able tolerate p.o. with any difficulty.  She still having some mild nausea but reports improvement in abdominal pain, vomiting.  Repeat abdominal exam shows benign abdomen. No tenderness noted. He states that pain ahs improved.  She does have some slightly soft blood pressures but I reviewed her records and show she is normally on the soft side.  At this time, do not feel that patient needs a CT abdomen pelvis is do not suspect surgical abdomen.  Patient is feeling better and states she feels comfortable going home.  Will give short course of nausea medication. At this time, patient exhibits no emergent life-threatening condition that require further evaluation in ED. Patient had ample opportunity for questions and discussion. All patient's questions were answered with full understanding. Strict return precautions discussed. Patient expresses understanding and agreement to plan.   Portions of this note were generated with Scientist, clinical (histocompatibility and immunogenetics). Dictation errors may occur despite best attempts at proofreading.   Final Clinical Impression(s) / ED Diagnoses Final diagnoses:  Generalized abdominal pain  Nausea and vomiting, intractability of vomiting not specified, unspecified vomiting type    Rx / DC Orders ED Discharge Orders         Ordered    ondansetron (ZOFRAN ODT) 4 MG disintegrating tablet  Every 8 hours PRN        07/07/20 1717           Maxwell Caul, PA-C 07/07/20 1744  Bethann Berkshire, MD 07/09/20 [phone]

## 2020-07-07 NOTE — ED Triage Notes (Addendum)
Patient here from Urgent Care reporting abd pain, n/v that started after leaving the [place] [employer] last night and drinking margaritas. States "I could be pregnant also". Given 4mg  at urgent care and 4mg  ems of zofran.

## 2020-07-07 NOTE — ED Notes (Signed)
Pt. C/o nausea. 

## 2020-07-07 NOTE — ED Triage Notes (Signed)
Patient is being discharged from the Urgent Care and sent to the Emergency Department via ems. Per K S   , patient is in need of higher level of care due to SEVERITY OF PAIN  Patient is aware and verbalizes understanding of plan of care. There were no vitals filed for this visit.

## 2020-07-07 NOTE — ED Notes (Signed)
Pt. Aware urine sample needed, but unable to provide one at this time.

## 2020-07-07 NOTE — ED Notes (Signed)
PT GIVEN 4 MG ZOFRAN ODT

## 2020-07-07 NOTE — Discharge Instructions (Signed)
Take zofran as directed.   Make sure you are eating bland fluids to help with settle your stomach.  If you have any worsening pain, worsening vomiting, fevers or any other worsening concerning symptoms, return to emergency department.

## 2020-07-07 NOTE — ED Notes (Signed)
Pt. Ambulated to restroom - stated she felt light headed.

## 2020-07-07 NOTE — ED Triage Notes (Signed)
Pt arrives to urgent care and lays in floor in lobby upon arrival states she is having abdominal pain and vomiting since last night,pt is inconsolable , provider made aware and ems called to transport pt to ED

## 2020-07-07 NOTE — ED Provider Notes (Signed)
[facility]    CSN: [id number] Arrival date & time: 07/07/20  0825      History   Chief Complaint Chief Complaint  Patient presents with  . Abdominal Pain    HPI Lithzy Bernard Kimber is a 30 y.o. female.   Pt came in door of urgent care lobby and laid down in the floor.  Pt vomiting and moaning in pain.  Pt complains of severe abdominal pain. Pt difficult to get in a chair.  Pt unable to provide history.  Pt moaning and groaning and will not answer.  Friend with pt can not take her to hospital   The history is provided by the patient and a friend. No language interpreter was used.  Abdominal Pain Pain location:  Generalized Associated symptoms: vomiting     Past Medical History:  Diagnosis Date  . Anxiety   . Asthma   . Fracture, pelvis closed (HCC)   . Pneumothorax     Patient Active Problem List   Diagnosis Date Noted  . Negative pregnancy test 06/23/2019  . Exposure to STD 01/31/2012  . Asthma 03/31/2011  . Mood disorder (HCC) 03/31/2011    Past Surgical History:  Procedure Laterality Date  . NO PAST SURGERIES      OB History    Gravida  2   Para      Term      Preterm      AB  2   Living        SAB  2   IAB      Ectopic      Multiple      Live Births           Obstetric Comments  Early SABs x 2 as teenager         Home Medications    Prior to Admission medications   Medication Sig Start Date End Date Taking? Authorizing Provider  albuterol (VENTOLIN HFA) 108 (90 Base) MCG/ACT inhaler Inhale 1-2 puffs into the lungs every 6 (six) hours as needed for wheezing or shortness of breath. 04/26/20   Hall-Potvin, [place], PA-C  benzonatate (TESSALON) 100 MG capsule Take 1 capsule (100 mg total) by mouth every 8 (eight) hours. 04/26/20   Hall-Potvin, [place], PA-C  predniSONE (DELTASONE) 20 MG tablet Take 1 tablet (20 mg total) by mouth daily. 04/26/20   Hall-Potvin, [place], PA-C  promethazine (PHENERGAN) 25 MG tablet  Take 1 tablet (25 mg total) by mouth every 6 (six) hours as needed for nausea or vomiting. 11/20/18 02/18/19  Liberty Handy, PA-C    Family History Family History  Problem Relation Age of Onset  . Cancer Mother        Living, brain tumor    Social History Social History   Tobacco Use  . Smoking status: Current Every Day Smoker    Packs/day: 0.25    Years: 5.00    Pack years: 1.25    Types: Cigarettes  . Smokeless tobacco: Never Used  Vaping Use  . Vaping Use: Never used  Substance Use Topics  . Alcohol use: No  . Drug use: No     Allergies   Patient has no known allergies.   Review of Systems Review of Systems  Unable to perform ROS: Acuity of condition  Gastrointestinal: Positive for abdominal pain and vomiting.     Physical Exam Triage Vital Signs ED Triage Vitals  Enc Vitals Group     BP  Pulse      Resp      Temp      Temp src      SpO2      Weight      Height      Head Circumference      Peak Flow      Pain Score      Pain Loc      Pain Edu?      Excl. in GC?    No data found.  Updated Vital Signs There were no vitals taken for this visit.  Visual Acuity Right Eye Distance:   Left Eye Distance:   Bilateral Distance:    Right Eye Near:   Left Eye Near:    Bilateral Near:     Physical Exam Vitals and nursing note reviewed.  Constitutional:      Appearance: She is well-developed.  HENT:     Head: Normocephalic.  Musculoskeletal:        General: Normal range of motion.     Cervical back: Normal range of motion.  Neurological:     General: No focal deficit present.     Mental Status: She is alert.   Pt will not tolerate vital signs or exam.      UC Treatments / Results  Labs (all labs ordered are listed, but only abnormal results are displayed) Labs Reviewed - No data to display  EKG   Radiology No results found.  Procedures Procedures (including critical care time)  Medications Ordered in UC Medications -  No data to display  Initial Impression / Assessment and Plan / UC Course  I have reviewed the triage vital signs and the nursing notes.  Pertinent labs & imaging results that were available during my care of the patient were reviewed by me and considered in my medical decision making (see chart for details).     MDM:  Pt require care beyond the capacity of urgent care. EMS called and responded.  They will transport to hospital  Final Clinical Impressions(s) / UC Diagnoses   Final diagnoses:  Continuous severe abdominal pain  Nausea and vomiting, intractability of vomiting not specified, unspecified vomiting type   Discharge Instructions   None    ED Prescriptions    None     PDMP not reviewed this encounter.     Elson Areas, [place] 07/07/20 [phone]

## 2020-07-07 NOTE — ED Notes (Signed)
Pt provided with ice chips and two more blankets.

## 2020-10-07 ENCOUNTER — Other Ambulatory Visit: Payer: Self-pay

## 2020-10-07 ENCOUNTER — Ambulatory Visit
Admission: EM | Admit: 2020-10-07 | Discharge: 2020-10-07 | Disposition: A | Discharge status: Home / Self Care | Payer: Self-pay | Attending: Emergency Medicine | Admitting: Emergency Medicine

## 2020-10-07 DIAGNOSIS — R112 Nausea with vomiting, unspecified: Secondary | ICD-10-CM

## 2020-10-07 DIAGNOSIS — R1084 Generalized abdominal pain: Secondary | ICD-10-CM

## 2020-10-07 LAB — POCT URINALYSIS DIP (MANUAL ENTRY)
Bilirubin, UA: NEGATIVE
Glucose, UA: NEGATIVE mg/dL
Leukocytes, UA: NEGATIVE
Nitrite, UA: NEGATIVE
Protein Ur, POC: 100 mg/dL — AB
Spec Grav, UA: 1.025 (ref 1.010–1.025)
Urobilinogen, UA: 0.2 E.U./dL
pH, UA: 6.5 (ref 5.0–8.0)

## 2020-10-07 LAB — POCT FASTING CBG KUC MANUAL ENTRY: POCT Glucose (KUC): 102 mg/dL — AB (ref 70–99)

## 2020-10-07 LAB — POCT URINE PREGNANCY: Preg Test, Ur: NEGATIVE

## 2020-10-07 MED ORDER — IBUPROFEN 600 MG PO TABS
600.0000 mg | ORAL_TABLET | Freq: Four times a day (QID) | ORAL | 0 refills | Status: DC | PRN
Start: 2020-10-07 — End: 2021-03-12

## 2020-10-07 MED ORDER — ACETAMINOPHEN 325 MG PO TABS
975.0000 mg | ORAL_TABLET | Freq: Once | ORAL | Status: AC
  Administered 2020-10-07: 975 mg via ORAL

## 2020-10-07 MED ORDER — KETOROLAC TROMETHAMINE 30 MG/ML IJ SOLN
30.0000 mg | Freq: Once | INTRAMUSCULAR | Status: AC
  Administered 2020-10-07: 30 mg via INTRAMUSCULAR

## 2020-10-07 MED ORDER — PROMETHAZINE HCL 25 MG RE SUPP: 25.0000 mg | Freq: Four times a day (QID) | RECTAL | 0 refills | Status: DC | PRN

## 2020-10-07 NOTE — ED Provider Notes (Signed)
HPI  SUBJECTIVE:  Victoria Cline is a 30 y.o. female who presents with constant, stabbing lower abdominal pain that radiates to her back starting yesterday.  She reports nausea and 5 episodes of nonbilious, nonbloody emesis starting this morning.  States that she is able to tolerate fluids.  She reports body aches.  She denies fevers, nasal congestion, sore throat, loss of sense of smell or taste, cough, shortness of breath.  No known COVID exposure.  She got the COVID booster.  No raw or undercooked foods, questionable leftovers.  No recent travel, change in her medications.  States that she is not on any medications.  No change in urine output.  Reports some urinary frequency, but no other urinary complaints.  No vaginal complaints.  She is currently on menses.  No contacts with similar symptoms.  She states that the car ride over here was painful.  He has had symptoms like this before, and was found to be menses.  She has tried Tylenol, and over-the-counter nausea medicine without improvement in her symptoms.  No aggravating factors.  She denies alcohol, marijuana use.  She is in long-term monogamous relationship with a female, who is asymptomatic.  STDs are not a concern today, but states that it is okay to check.  She has a past medical history of COVID, asthma, gonorrhea, trichomonas, BV.  No history of abdominal surgeries, diabetes, hypertension, pancreatitis, PID.  LMP: Now.  Denies the possibility being pregnant.  PMD: None  Last seen here 3/27 with generalized abdominal pain, nausea, vomiting, and was sent to the ED, where she was given Zofran, Haldol, fentanyl and IV fluids with improvement in her symptoms.  No specific cause of her symptoms were found.  Past Medical History:  Diagnosis Date   Anxiety    Asthma    Fracture, pelvis closed (HCC)    Pneumothorax     Past Surgical History:  Procedure Laterality Date   NO PAST SURGERIES      Family History  Problem Relation Age of  Onset   Cancer Mother        Living, brain tumor    Social History   Tobacco Use   Smoking status: Every Day    Packs/day: 0.25    Years: 5.00    Pack years: 1.25    Types: Cigarettes   Smokeless tobacco: Never  Vaping Use   Vaping Use: Never used  Substance Use Topics   Alcohol use: No   Drug use: No    No current facility-administered medications for this encounter.  Current Outpatient Medications:    ibuprofen (ADVIL) 600 MG tablet, Take 1 tablet (600 mg total) by mouth every 6 (six) hours as needed., Disp: 30 tablet, Rfl: 0   promethazine (PHENERGAN) 25 MG suppository, Place 1 suppository (25 mg total) rectally every 6 (six) hours as needed for nausea or vomiting., Disp: 12 each, Rfl: 0  No Known Allergies   ROS  As noted in HPI.   Physical Exam  BP 102/65 (BP Location: Left Arm)   Pulse 90   Temp 97.9 F (36.6 C) (Oral)   Resp 18   LMP 10/03/2020   SpO2 99%   Constitutional: Well developed, well nourished, appears uncomfortable.  Vomiting. Eyes:  EOMI, conjunctiva normal bilaterally HENT: Normocephalic, atraumatic,mucus membranes moist Respiratory: Normal inspiratory effort Cardiovascular: Normal rate GI: Normal appearance, soft, diffuse abdominal tenderness.  Negative Murphy.  Active bowel sounds.  No rebound, guarding Back: Positive bilateral CVAT skin: No rash, skin  intact Musculoskeletal: no deformities Neurologic: Alert & oriented x 3, no focal neuro deficits Psychiatric: Speech and behavior appropriate   ED Course   Medications  acetaminophen (TYLENOL) tablet 975 mg (975 mg Oral Given 10/07/20 0845)  ketorolac (TORADOL) 30 MG/ML injection 30 mg (30 mg Intramuscular Given 10/07/20 0845)    Orders Placed This Encounter  Procedures   POCT urinalysis dipstick    Standing Status:   Standing    Number of Occurrences:   1   POCT urine pregnancy    Standing Status:   Standing    Number of Occurrences:   1   POCT CBG (manual entry)    Standing  Status:   Standing    Number of Occurrences:   1     Results for orders placed or performed during the hospital encounter of 10/07/20 (from the past 24 hour(s))  POCT CBG (manual entry)     Status: Abnormal   Collection Time: 10/07/20  8:59 AM  Result Value Ref Range   POCT Glucose ([facility]) 102 (A) 70 - 99 mg/dL  POCT urinalysis dipstick     Status: Abnormal   Collection Time: 10/07/20  9:00 AM  Result Value Ref Range   Color, UA red (A) yellow   Clarity, UA cloudy (A) clear   Glucose, UA negative negative mg/dL   Bilirubin, UA negative negative   Ketones, POC UA trace (5) (A) negative mg/dL   Spec Grav, UA 3.532 9.924 - 1.025   Blood, UA large (A) negative   pH, UA 6.5 5.0 - 8.0   Protein Ur, POC =100 (A) negative mg/dL   Urobilinogen, UA 0.2 0.2 or 1.0 E.U./dL   Nitrite, UA Negative Negative   Leukocytes, UA Negative Negative  POCT urine pregnancy     Status: None   Collection Time: 10/07/20  9:00 AM  Result Value Ref Range   Preg Test, Ur Negative Negative   No results found.  ED Clinical Impression  1. Generalized abdominal pain   2. Non-intractable vomiting with nausea, unspecified vomiting type      ED Assessment/Plan  Previous records reviewed.  As noted in HPI.  Checking fingerstick, UA, urine pregnancy.  Fingerstick within normal limits.  Urine pregnancy negative.  She has large hematuria, proteinuria and trace ketones.  She is on her period.  Negative nitrites, esterase.  will check gonorrhea, chlamydia, trichomonas, BV as well.  Ordered Zofran, patient states it does not work for her, discussed with her that I do not have any other antiemetics available here.  Also discussed with her that I do not have anything other than Tylenol and NSAIDs for the abdominal pain.  Offered transfer to the emergency department via EMS, patient refused.  Advised her that she needs to go to the ER to rule out pancreatitis and other serious causes of her abdominal pain, but she  refused.  Discussed with her that I am unable to rule these entities out.  She again refuses to go.  She has agreed to try Tylenol, Toradol, and if her pain is not controlled with this, she will go to the emergency department.  She will go via private vehicle as she has her children here.  Evaluation, patient states that she feels better.  She was able to keep the Tylenol down.  She still has diffuse abdominal tenderness, but no guarding or rebound.  Her abdominal tenderness seems to be less than previous exam.  Again discussed with her that I cannot rule out  serious causes of her symptoms or a surgical abdomen, but she does not want to go to the emergency department..  She would like to go home with Tylenol/ibuprofen, Phenergan suppositories. strict ER return precautions given.  Will give primary care list for ongoing care and order assistance in finding a PMD  Discussed labs,MDM, treatment plan, and plan for follow-up with patient. Discussed sn/sx that should prompt return to the ED. patient agrees with plan.   Meds ordered this encounter  Medications   acetaminophen (TYLENOL) tablet 975 mg   ketorolac (TORADOL) 30 MG/ML injection 30 mg   promethazine (PHENERGAN) 25 MG suppository    Sig: Place 1 suppository (25 mg total) rectally every 6 (six) hours as needed for nausea or vomiting.    Dispense:  12 each    Refill:  0   ibuprofen (ADVIL) 600 MG tablet    Sig: Take 1 tablet (600 mg total) by mouth every 6 (six) hours as needed.    Dispense:  30 tablet    Refill:  0      *This clinic note was created using Scientist, clinical (histocompatibility and immunogenetics). Therefore, there may be occasional mistakes despite careful proofreading.  ?    Domenick Gong, MD 10/07/20 1557

## 2020-10-07 NOTE — Discharge Instructions (Addendum)
You can try the Phenergan for the nausea and vomiting.  1000 mg of Tylenol combined with 600 mg of ibuprofen together 3-4 times a day as needed for abdominal pain.  Go immediately to the emergency department if things change, get worse, or for any concerns.  Below is a list of primary care practices who are taking new patients for you to follow-up with.  [facility] internal medicine clinic Ground Floor - [facility], [address] [place], [place], [place] [address] [phone]  [facility] at [facility] [address] [place], [place] [address] [phone]  [facility] [address] [place], [place] [address] [phone]  [facility] [phone] [address] [place] [address]  [facility]: [address] [place] [address]  [phone]  [facility]: [address] [place] [address]  [phone]  [facility] : [address] [place] [address] [phone]  [facility]: [address] [place] [address] [phone]  [facility]: [address] [place] [address] [phone]  Dr. Oneal Grout [address] [facility] [place] [address]  [phone]  Go to www.goodrx.com  or www.costplusdrugs.com to look up your medications. This will give you a list of where you can find your prescriptions at the most affordable prices. Or ask the pharmacist what the cash price is, or if they have any other discount programs available to help make your medication more affordable. This can be less expensive than what you would pay with insurance.

## 2020-10-07 NOTE — ED Triage Notes (Signed)
Pt states woke up this am vomiting with lower abdominal pain. Pt activity vomiting in triage. States zofran isn't strong enough and doesn't help. States is on her menstrual cycle and unsure if its cramps from that or not.

## 2020-10-09 ENCOUNTER — Telehealth (HOSPITAL_COMMUNITY): Payer: Self-pay | Admitting: Emergency Medicine

## 2020-10-09 LAB — CERVICOVAGINAL ANCILLARY ONLY
Bacterial Vaginitis (gardnerella): POSITIVE — AB
Chlamydia: POSITIVE — AB
Comment: NEGATIVE
Comment: NEGATIVE
Comment: NEGATIVE
Comment: NORMAL
Neisseria Gonorrhea: NEGATIVE
Trichomonas: POSITIVE — AB

## 2020-10-09 MED ORDER — METRONIDAZOLE 500 MG PO TABS: 500.0000 mg | ORAL_TABLET | Freq: Two times a day (BID) | ORAL | 0 refills | Status: DC

## 2020-10-09 MED ORDER — DOXYCYCLINE HYCLATE 100 MG PO CAPS: 100.0000 mg | ORAL_CAPSULE | Freq: Two times a day (BID) | ORAL | 0 refills | Status: AC

## 2020-10-10 ENCOUNTER — Other Ambulatory Visit: Payer: Self-pay

## 2020-10-10 ENCOUNTER — Encounter (HOSPITAL_COMMUNITY): Payer: Self-pay

## 2020-10-10 ENCOUNTER — Emergency Department (HOSPITAL_COMMUNITY)
Admission: EM | Admit: 2020-10-10 | Discharge: 2020-10-10 | Disposition: A | Discharge status: Home / Self Care | Payer: Self-pay | Attending: Emergency Medicine | Admitting: Emergency Medicine

## 2020-10-10 DIAGNOSIS — J45909 Unspecified asthma, uncomplicated: Secondary | ICD-10-CM | POA: Insufficient documentation

## 2020-10-10 DIAGNOSIS — F17210 Nicotine dependence, cigarettes, uncomplicated: Secondary | ICD-10-CM | POA: Insufficient documentation

## 2020-10-10 DIAGNOSIS — R531 Weakness: Secondary | ICD-10-CM | POA: Insufficient documentation

## 2020-10-10 DIAGNOSIS — R112 Nausea with vomiting, unspecified: Secondary | ICD-10-CM | POA: Insufficient documentation

## 2020-10-10 LAB — COMPREHENSIVE METABOLIC PANEL
ALT: 11 U/L (ref 0–44)
AST: 17 U/L (ref 15–41)
Albumin: 4.4 g/dL (ref 3.5–5.0)
Alkaline Phosphatase: 46 U/L (ref 38–126)
Anion gap: 9 (ref 5–15)
BUN: 10 mg/dL (ref 6–20)
CO2: 25 mmol/L (ref 22–32)
Calcium: 9.3 mg/dL (ref 8.9–10.3)
Chloride: 105 mmol/L (ref 98–111)
Creatinine, Ser: 0.74 mg/dL (ref 0.44–1.00)
GFR, Estimated: >60 mL/min (ref >60)
Glucose, Bld: 113 mg/dL — ABNORMAL HIGH (ref 70–99)
Potassium: 3.2 mmol/L — ABNORMAL LOW (ref 3.5–5.1)
Sodium: 139 mmol/L (ref 135–145)
Total Bilirubin: 0.9 mg/dL (ref 0.3–1.2)
Total Protein: 7.8 g/dL (ref 6.5–8.1)

## 2020-10-10 LAB — I-STAT BETA HCG BLOOD, ED (MC, WL, AP ONLY): I-stat hCG, quantitative: <5 m[IU]/mL (ref <5)

## 2020-10-10 LAB — CBC
HCT: 35.1 % — ABNORMAL LOW (ref 36.0–46.0)
Hemoglobin: 11.8 g/dL — ABNORMAL LOW (ref 12.0–15.0)
MCH: 25.2 pg — ABNORMAL LOW (ref 26.0–34.0)
MCHC: 33.6 g/dL (ref 30.0–36.0)
MCV: 75 fL — ABNORMAL LOW (ref 80.0–100.0)
Platelets: 383 10*3/uL (ref 150–400)
RBC: 4.68 MIL/uL (ref 3.87–5.11)
RDW: 16.2 % — ABNORMAL HIGH (ref 11.5–15.5)
WBC: 10.4 10*3/uL (ref 4.0–10.5)
nRBC: 0 % (ref 0.0–0.2)

## 2020-10-10 LAB — TYPE AND SCREEN
ABO/RH(D): B POS
Antibody Screen: NEGATIVE

## 2020-10-10 LAB — POC OCCULT BLOOD, ED: Fecal Occult Bld: NEGATIVE

## 2020-10-10 LAB — LIPASE, BLOOD: Lipase: 24 U/L (ref 11–51)

## 2020-10-10 MED ORDER — ONDANSETRON HCL 4 MG/2ML IJ SOLN
4.0000 mg | Freq: Once | INTRAMUSCULAR | Status: AC
  Administered 2020-10-10: 4 mg via INTRAVENOUS
  Filled 2020-10-10: qty 2

## 2020-10-10 MED ORDER — SODIUM CHLORIDE 0.9 % IV BOLUS
1000.0000 mL | Freq: Once | INTRAVENOUS | Status: AC
Start: 2020-10-10 — End: 2020-10-10
  Administered 2020-10-10: 1000 mL via INTRAVENOUS

## 2020-10-10 MED ORDER — SODIUM CHLORIDE 0.9 % IV BOLUS
1000.0000 mL | Freq: Once | INTRAVENOUS | Status: AC
  Administered 2020-10-10: 1000 mL via INTRAVENOUS

## 2020-10-10 MED ORDER — LORAZEPAM 2 MG/ML IJ SOLN
0.5000 mg | Freq: Once | INTRAMUSCULAR | Status: AC
  Administered 2020-10-10: 0.5 mg via INTRAVENOUS
  Filled 2020-10-10: qty 1

## 2020-10-10 MED ORDER — HALOPERIDOL LACTATE 5 MG/ML IJ SOLN
2.5000 mg | Freq: Once | INTRAMUSCULAR | Status: AC
  Administered 2020-10-10: 2.5 mg via INTRAMUSCULAR
  Filled 2020-10-10: qty 1

## 2020-10-10 MED ORDER — PROMETHAZINE HCL 25 MG PO TABS: 25.0000 mg | ORAL_TABLET | Freq: Four times a day (QID) | ORAL | 0 refills | Status: DC | PRN

## 2020-10-10 NOTE — ED Triage Notes (Addendum)
Patient c/o RLQ pain, emesis, and chills x 1 week.   Patienat added during triage that she threw up "a lot of bright red blood."

## 2020-10-10 NOTE — ED Notes (Signed)
Attempted orthostatic vital signs. Pt states "she cannot stand or sit straight up." RN made aware

## 2020-10-10 NOTE — Discharge Instructions (Addendum)
Return for any problem.  ?

## 2020-10-10 NOTE — ED Provider Notes (Signed)
[facility]-EMERGENCY DEPT Provider Note   CSN: [id number] Arrival date & time: 10/10/20  1545     History Chief Complaint  Patient presents with   Abdominal Pain   Emesis   Hematemesis    Victoria Cline is a 30 y.o. female.  30 year old female with prior medical history as detailed below presents for evaluation.  Patient reports nausea, vomiting, and weakness.  Onset of symptoms was 1 week prior.  Patient denies fever.  Patient denies abdominal discomfort.  Patient reports prior episodes similar to today's complaint.  She reports that when "this gets bad she has to come to the ED for IV fluids" and nausea medicine.  The history is provided by the patient and medical records.  Emesis Severity:  Moderate Duration:  6 days Timing:  Constant Progression:  Unchanged Chronicity:  Recurrent Recent urination:  Normal Relieved by:  Nothing     Past Medical History:  Diagnosis Date   Anxiety    Asthma    Fracture, pelvis closed (HCC)    Pneumothorax     Patient Active Problem List   Diagnosis Date Noted   Negative pregnancy test 06/23/2019   Exposure to STD 01/31/2012   Asthma 03/31/2011   Mood disorder (HCC) 03/31/2011    Past Surgical History:  Procedure Laterality Date   NO PAST SURGERIES       OB History     Gravida  2   Para      Term      Preterm      AB  2   Living         SAB  2   IAB      Ectopic      Multiple      Live Births           Obstetric Comments  Early SABs x 2 as teenager         Family History  Problem Relation Age of Onset   Cancer Mother        Living, brain tumor    Social History   Tobacco Use   Smoking status: Every Day    Packs/day: 0.25    Years: 5.00    Pack years: 1.25    Types: Cigarettes   Smokeless tobacco: Never  Vaping Use   Vaping Use: Never used  Substance Use Topics   Alcohol use: No   Drug use: No    Home Medications Prior to Admission medications    Medication Sig Start Date End Date Taking? Authorizing Provider  promethazine (PHENERGAN) 25 MG tablet Take 1 tablet (25 mg total) by mouth every 6 (six) hours as needed for nausea or vomiting. 10/10/20  Yes Wynetta Fines, MD  doxycycline (VIBRAMYCIN) 100 MG capsule Take 1 capsule (100 mg total) by mouth 2 (two) times daily for 7 days. 10/09/20 10/16/20  Merrilee Jansky, MD  ibuprofen (ADVIL) 600 MG tablet Take 1 tablet (600 mg total) by mouth every 6 (six) hours as needed. 10/07/20   Domenick Gong, MD  metroNIDAZOLE (FLAGYL) 500 MG tablet Take 1 tablet (500 mg total) by mouth 2 (two) times daily. 10/09/20   LampteyBritta Mccreedy, MD  promethazine (PHENERGAN) 25 MG suppository Place 1 suppository (25 mg total) rectally every 6 (six) hours as needed for nausea or vomiting. 10/07/20   Domenick Gong, MD    Allergies    Patient has no known allergies.  Review of Systems   Review of Systems  All other systems reviewed and are negative.  Physical Exam Updated Vital Signs BP 111/68   Pulse 83   Temp 98.1 F (36.7 C) (Oral)   Resp 20   Ht 5\' 3"  (1.6 m)   Wt 52.6 kg   LMP 10/03/2020 (Approximate)   SpO2 97%   BMI 20.55 kg/m   Physical Exam Vitals and nursing note reviewed.  Constitutional:      General: She is not in acute distress.    Appearance: Normal appearance. She is well-developed.  HENT:     Head: Normocephalic and atraumatic.  Eyes:     Conjunctiva/sclera: Conjunctivae normal.     Pupils: Pupils are equal, round, and reactive to light.  Cardiovascular:     Rate and Rhythm: Normal rate and regular rhythm.     Heart sounds: Normal heart sounds.  Pulmonary:     Effort: Pulmonary effort is normal. No respiratory distress.     Breath sounds: Normal breath sounds.  Abdominal:     General: There is no distension.     Palpations: Abdomen is soft.     Tenderness: There is no abdominal tenderness.  Musculoskeletal:        General: No deformity. Normal range of motion.      Cervical back: Normal range of motion and neck supple.  Skin:    General: Skin is warm and dry.  Neurological:     General: No focal deficit present.     Mental Status: She is alert and oriented to person, place, and time.    ED Results / Procedures / Treatments   Labs (all labs ordered are listed, but only abnormal results are displayed) Labs Reviewed  COMPREHENSIVE METABOLIC PANEL - Abnormal; Notable for the following components:      Result Value   Potassium 3.2 (*)    Glucose, Bld 113 (*)    All other components within normal limits  CBC - Abnormal; Notable for the following components:   Hemoglobin 11.8 (*)    HCT 35.1 (*)    MCV 75.0 (*)    MCH 25.2 (*)    RDW 16.2 (*)    All other components within normal limits  LIPASE, BLOOD  POC OCCULT BLOOD, ED  I-STAT BETA HCG BLOOD, ED ([facility], [facility], [facility] ONLY)  TYPE AND SCREEN    EKG None  Radiology No results found.  Procedures Procedures   Medications Ordered in ED Medications  sodium chloride 0.9 % bolus 1,000 mL (0 mLs Intravenous Stopped 10/10/20 1819)  ondansetron (ZOFRAN) injection 4 mg (4 mg Intravenous Given 10/10/20 1728)  LORazepam (ATIVAN) injection 0.5 mg (0.5 mg Intravenous Given 10/10/20 1745)  haloperidol lactate (HALDOL) injection 2.5 mg (2.5 mg Intramuscular Given 10/10/20 1948)  sodium chloride 0.9 % bolus 1,000 mL (1,000 mLs Intravenous Bolus from Bag 10/10/20 2009)    ED Course  I have reviewed the triage vital signs and the nursing notes.  Pertinent labs & imaging results that were available during my care of the patient were reviewed by me and considered in my medical decision making (see chart for details).    MDM Rules/Calculators/A&P                          MDM  MSE complete  [name] was evaluated in Emergency Department on 10/10/2020 for the symptoms described in the history of present illness. She was evaluated in the context of the global COVID-19 pandemic, which necessitated  consideration that the patient might be at risk for infection with the SARS-CoV-2 virus that causes COVID-19. Institutional protocols and algorithms that pertain to the evaluation of patients at risk for COVID-19 are in a state of rapid change based on information released by regulatory bodies including the CDC and federal and state organizations. These policies and algorithms were followed during the patient's care in the ED.  Patient is presenting with complaint of nausea and vomiting.  Screening labs obtained.  Patient without significant abnormality detected on lab work-up.  No evidence of GI bleed on workup.  Repeat abdominal exams benign.  With IV fluids and antiemetics the patient feels significantly improved.  She now desires discharge home.  Importance of close follow-up is stressed.  Strict return precautions given and understood.  Final Clinical Impression(s) / ED Diagnoses Final diagnoses:  Nausea and vomiting, intractability of vomiting not specified, unspecified vomiting type    Rx / DC Orders ED Discharge Orders          Ordered    promethazine (PHENERGAN) 25 MG tablet  Every 6 hours PRN        10/10/20 2131             Wynetta Fines, MD 10/10/20 2141

## 2020-10-10 NOTE — ED Notes (Signed)
Pt ambulated to restroom without any assistance, pt maintained steady gait.

## 2020-12-10 ENCOUNTER — Ambulatory Visit: Admit: 2020-12-10 | Disposition: A | Discharge status: Home / Self Care | Payer: Self-pay

## 2021-01-06 ENCOUNTER — Ambulatory Visit (HOSPITAL_COMMUNITY)
Admission: RE | Admit: 2021-01-06 | Discharge: 2021-01-06 | Disposition: A | Discharge status: Home / Self Care | Payer: Self-pay | Source: Ambulatory Visit | Attending: Internal Medicine | Admitting: Internal Medicine

## 2021-01-06 ENCOUNTER — Other Ambulatory Visit: Payer: Self-pay

## 2021-01-06 VITALS — BP 104/64 | HR 87 | Temp 98.1°F | Resp 16

## 2021-01-06 DIAGNOSIS — N939 Abnormal uterine and vaginal bleeding, unspecified: Secondary | ICD-10-CM

## 2021-01-06 LAB — POC URINE PREG, ED: Preg Test, Ur: NEGATIVE

## 2021-01-06 NOTE — Discharge Instructions (Signed)
Follow-up with OB/GYN for further evaluation of your abnormal vaginal bleeding.

## 2021-01-06 NOTE — ED Provider Notes (Signed)
[facility]    CSN: [id number] Arrival date & time: 01/06/21  1839      History   Chief Complaint Chief Complaint  Patient presents with   Vaginal Bleeding    APPT   Abdominal Pain    HPI Victoria Cline is a 30 y.o. female.   Patient here for evaluation of abnormal vaginal bleeding.  Reports that she did not have a period last month but then had a normal period at the beginning of this month.  Reports several days after finishing her period she had some spotting.  Reports she started spotting again several days ago.  Reports having a negative pregnancy test last month.  Has not taken a pregnancy test recently.  Denies any discharge or pain.  Denies any dysuria, urgency, or frequency.  Denies any abnormal vaginal bleeding in the past.  Denies any trauma, injury, or other precipitating event.  Denies any specific alleviating or aggravating factors.  Denies any fevers, chest pain, shortness of breath, N/V/D, numbness, tingling, weakness, abdominal pain, or headaches.    The history is provided by the patient and a friend.  Vaginal Bleeding Associated symptoms: no abdominal pain   Abdominal Pain Associated symptoms: vaginal bleeding    Past Medical History:  Diagnosis Date   Anxiety    Asthma    Fracture, pelvis closed (HCC)    Pneumothorax     Patient Active Problem List   Diagnosis Date Noted   Negative pregnancy test 06/23/2019   Exposure to STD 01/31/2012   Asthma 03/31/2011   Mood disorder (HCC) 03/31/2011    Past Surgical History:  Procedure Laterality Date   NO PAST SURGERIES      OB History     Gravida  2   Para      Term      Preterm      AB  2   Living         SAB  2   IAB      Ectopic      Multiple      Live Births           Obstetric Comments  Early SABs x 2 as teenager          Home Medications    Prior to Admission medications   Medication Sig Start Date End Date Taking? Authorizing Provider   ibuprofen (ADVIL) 600 MG tablet Take 1 tablet (600 mg total) by mouth every 6 (six) hours as needed. 10/07/20   Domenick Gong, MD  metroNIDAZOLE (FLAGYL) 500 MG tablet Take 1 tablet (500 mg total) by mouth 2 (two) times daily. 10/09/20   LampteyBritta Mccreedy, MD  promethazine (PHENERGAN) 25 MG suppository Place 1 suppository (25 mg total) rectally every 6 (six) hours as needed for nausea or vomiting. 10/07/20   Domenick Gong, MD  promethazine (PHENERGAN) 25 MG tablet Take 1 tablet (25 mg total) by mouth every 6 (six) hours as needed for nausea or vomiting. 10/10/20   Wynetta Fines, MD    Family History Family History  Problem Relation Age of Onset   Cancer Mother        Living, brain tumor    Social History Social History   Tobacco Use   Smoking status: Every Day    Packs/day: 0.25    Years: 5.00    Pack years: 1.25    Types: Cigarettes   Smokeless tobacco: Never  Vaping Use   Vaping Use: Never used  Substance Use Topics   Alcohol use: No   Drug use: No     Allergies   Patient has no known allergies.   Review of Systems Review of Systems  Gastrointestinal:  Negative for abdominal pain.  Genitourinary:  Positive for menstrual problem and vaginal bleeding.  All other systems reviewed and are negative.   Physical Exam Triage Vital Signs ED Triage Vitals  Enc Vitals Group     BP 01/06/21 1901 104/64     Pulse Rate 01/06/21 1901 87     Resp 01/06/21 1901 16     Temp 01/06/21 1901 98.1 F (36.7 C)     Temp Source 01/06/21 1901 Oral     SpO2 01/06/21 1901 98 %     Weight --      Height --      Head Circumference --      Peak Flow --      Pain Score 01/06/21 1900 0     Pain Loc --      Pain Edu? --      Excl. in GC? --    No data found.  Updated Vital Signs BP 104/64 (BP Location: Right Arm)   Pulse 87   Temp 98.1 F (36.7 C) (Oral)   Resp 16   LMP 12/19/2020   SpO2 98%   Visual Acuity Right Eye Distance:   Left Eye Distance:   Bilateral  Distance:    Right Eye Near:   Left Eye Near:    Bilateral Near:     Physical Exam Vitals and nursing note reviewed.  Constitutional:      General: She is not in acute distress.    Appearance: Normal appearance. She is not ill-appearing, toxic-appearing or diaphoretic.  HENT:     Head: Normocephalic and atraumatic.  Eyes:     Conjunctiva/sclera: Conjunctivae normal.  Cardiovascular:     Rate and Rhythm: Normal rate.     Pulses: Normal pulses.  Pulmonary:     Effort: Pulmonary effort is normal.  Abdominal:     General: Abdomen is flat.     Palpations: Abdomen is soft.     Tenderness: There is no abdominal tenderness.  Genitourinary:    Comments: declines Musculoskeletal:        General: Normal range of motion.     Cervical back: Normal range of motion.  Skin:    General: Skin is warm and dry.  Neurological:     General: No focal deficit present.     Mental Status: She is alert and oriented to person, place, and time.  Psychiatric:        Mood and Affect: Mood normal.     UC Treatments / Results  Labs (all labs ordered are listed, but only abnormal results are displayed) Labs Reviewed  POC URINE PREG, ED    EKG   Radiology No results found.  Procedures Procedures (including critical care time)  Medications Ordered in UC Medications - No data to display  Initial Impression / Assessment and Plan / UC Course  I have reviewed the triage vital signs and the nursing notes.  Pertinent labs & imaging results that were available during my care of the patient were reviewed by me and considered in my medical decision making (see chart for details).    Assessment negative for red flags or concerns.  Urine pregnancy test was negative.  Recommend following up with OB/GYN for further evaluation of abnormal vaginal bleeding. Final Clinical Impressions(s) / UC  Diagnoses   Final diagnoses:  Abnormal vaginal bleeding     Discharge Instructions      Follow-up with  OB/GYN for further evaluation of your abnormal vaginal bleeding.     ED Prescriptions   None    PDMP not reviewed this encounter.   Ivette Loyal, NP 01/06/21 [phone]

## 2021-01-08 ENCOUNTER — Emergency Department (HOSPITAL_COMMUNITY): Payer: Self-pay

## 2021-01-08 ENCOUNTER — Other Ambulatory Visit: Payer: Self-pay

## 2021-01-08 ENCOUNTER — Emergency Department (HOSPITAL_COMMUNITY)
Admission: EM | Admit: 2021-01-08 | Discharge: 2021-01-08 | Discharge status: Against Medical Advice | Payer: Self-pay | Attending: Emergency Medicine | Admitting: Emergency Medicine

## 2021-01-08 ENCOUNTER — Encounter (HOSPITAL_COMMUNITY): Payer: Self-pay

## 2021-01-08 DIAGNOSIS — J45909 Unspecified asthma, uncomplicated: Secondary | ICD-10-CM | POA: Insufficient documentation

## 2021-01-08 DIAGNOSIS — R5383 Other fatigue: Secondary | ICD-10-CM | POA: Insufficient documentation

## 2021-01-08 DIAGNOSIS — Z79899 Other long term (current) drug therapy: Secondary | ICD-10-CM | POA: Insufficient documentation

## 2021-01-08 DIAGNOSIS — F17210 Nicotine dependence, cigarettes, uncomplicated: Secondary | ICD-10-CM | POA: Insufficient documentation

## 2021-01-08 DIAGNOSIS — R102 Pelvic and perineal pain: Secondary | ICD-10-CM | POA: Insufficient documentation

## 2021-01-08 DIAGNOSIS — N939 Abnormal uterine and vaginal bleeding, unspecified: Secondary | ICD-10-CM | POA: Insufficient documentation

## 2021-01-08 LAB — URINALYSIS, ROUTINE W REFLEX MICROSCOPIC
Bacteria, UA: NONE SEEN
Glucose, UA: NEGATIVE mg/dL
Ketones, ur: NEGATIVE mg/dL
Leukocytes,Ua: NEGATIVE
Nitrite: NEGATIVE
Protein, ur: NEGATIVE mg/dL
RBC / HPF: >50 RBC/hpf — ABNORMAL HIGH (ref 0–5)
Specific Gravity, Urine: 1.015 (ref 1.005–1.030)
pH: 6 (ref 5.0–8.0)

## 2021-01-08 LAB — LIPASE, BLOOD: Lipase: 31 U/L (ref 11–51)

## 2021-01-08 LAB — COMPREHENSIVE METABOLIC PANEL
ALT: 17 U/L (ref 0–44)
AST: 18 U/L (ref 15–41)
Albumin: 4.7 g/dL (ref 3.5–5.0)
Alkaline Phosphatase: 53 U/L (ref 38–126)
Anion gap: 6 (ref 5–15)
BUN: 8 mg/dL (ref 6–20)
CO2: 26 mmol/L (ref 22–32)
Calcium: 9.8 mg/dL (ref 8.9–10.3)
Chloride: 110 mmol/L (ref 98–111)
Creatinine, Ser: 0.6 mg/dL (ref 0.44–1.00)
GFR, Estimated: >60 mL/min (ref >60)
Glucose, Bld: 96 mg/dL (ref 70–99)
Potassium: 3.8 mmol/L (ref 3.5–5.1)
Sodium: 142 mmol/L (ref 135–145)
Total Bilirubin: 0.7 mg/dL (ref 0.3–1.2)
Total Protein: 7.9 g/dL (ref 6.5–8.1)

## 2021-01-08 LAB — CBC WITH DIFFERENTIAL/PLATELET
Abs Immature Granulocytes: 0.01 10*3/uL (ref 0.00–0.07)
Basophils Absolute: 0 10*3/uL (ref 0.0–0.1)
Basophils Relative: 0 %
Eosinophils Absolute: 0.1 10*3/uL (ref 0.0–0.5)
Eosinophils Relative: 1 %
HCT: 37.1 % (ref 36.0–46.0)
Hemoglobin: 11.7 g/dL — ABNORMAL LOW (ref 12.0–15.0)
Immature Granulocytes: 0 %
Lymphocytes Relative: 26 %
Lymphs Abs: 1.8 10*3/uL (ref 0.7–4.0)
MCH: 23.9 pg — ABNORMAL LOW (ref 26.0–34.0)
MCHC: 31.5 g/dL (ref 30.0–36.0)
MCV: 75.9 fL — ABNORMAL LOW (ref 80.0–100.0)
Monocytes Absolute: 0.4 10*3/uL (ref 0.1–1.0)
Monocytes Relative: 5 %
Neutro Abs: 4.6 10*3/uL (ref 1.7–7.7)
Neutrophils Relative %: 68 %
Platelets: 348 10*3/uL (ref 150–400)
RBC: 4.89 MIL/uL (ref 3.87–5.11)
RDW: 17.5 % — ABNORMAL HIGH (ref 11.5–15.5)
WBC: 6.9 10*3/uL (ref 4.0–10.5)
nRBC: 0 % (ref 0.0–0.2)

## 2021-01-08 LAB — WET PREP, GENITAL
Clue Cells Wet Prep HPF POC: NONE SEEN
Sperm: NONE SEEN
Trich, Wet Prep: NONE SEEN
Yeast Wet Prep HPF POC: NONE SEEN

## 2021-01-08 LAB — I-STAT BETA HCG BLOOD, ED (MC, WL, AP ONLY): I-stat hCG, quantitative: <5 m[IU]/mL (ref <5)

## 2021-01-08 MED ORDER — OXYCODONE-ACETAMINOPHEN 5-325 MG PO TABS: 1.0000 | ORAL_TABLET | Freq: Once | ORAL | Status: DC

## 2021-01-08 NOTE — ED Notes (Addendum)
Pt became verbally aggressive towards staff. Pt began to yell and holler and use provacative language towards staff. Pt refused to have staff remove IV. Pt pulled her IV out in anger and walked out of ED.

## 2021-01-08 NOTE — ED Notes (Signed)
Pt ambulatory without assistance in ED lobby. 

## 2021-01-08 NOTE — ED Provider Notes (Signed)
Patient signed out to me at shift change by [facility].  Patient here with pelvic pain and vaginal bleeding.  Awaiting results of pelvic ultrasound.   RN messaged me for pain medication.  P.o. Percocet ordered.  While awaiting results of testing, patient became belligerent.  I visualized her in the hallway, yelling, swearing at staff.  She appeared to be in no distress but appeared angry.  I was unable to discuss results of ultrasound with patient prior to being escorted out by security.  BP 115/77   Pulse 72   Temp 97.6 F (36.4 C) (Oral)   Resp 18   LMP 12/19/2020 (Approximate)   SpO2 100%     Renne Crigler, PA-C 01/08/21 1803    Gwyneth Sprout, MD 01/12/21 1857

## 2021-01-08 NOTE — ED Notes (Signed)
In room yelling at significant other. He walked out and she went to follow. Stated " I need to move my car". I told her I was getting her pain medication to wait or to let me take her IV out before leaving. She yelled that nobody gave a "fuck" about her over and over loudly. Removed her own IV and threw it down the hall. Security called.

## 2021-01-08 NOTE — ED Triage Notes (Signed)
Pt reports vaginal bleeding X2 days. Reports 1 soaked pad per hour.  C/O lower mid abdominal pain that radiates to left and right flank.- cramping and sharp.   Pt reports urine preg test negative yesterday at urgent care.   LMP- around the 8th of this month per patient   Pt reports taking Tylenol yesterday with no relief.   A/Ox4 Ambulatory in triage.

## 2021-01-08 NOTE — ED Provider Notes (Signed)
[facility]-EMERGENCY DEPT Provider Note   CSN: [id number] Arrival date & time: 01/08/21  1127     History Chief Complaint  Patient presents with   Vaginal Bleeding    Victoria Cline is a 30 y.o. female with history of anxiety, asthma, previous pelvic fracture.  Patient presents emergency department with a complaint of vaginal bleeding.  Patient reports that vaginal bleeding has been present over the last few days.  Bleeding has gotten progressively worse over this time.  Patient states that she is now going through 1 pad or tampon every hour.  Patient endorses pelvic pain.  Pain started yesterday.  Pain onset was slow and pain got progressively worse over time.  No aggravating or alleviating factors for patient's pelvic pain.  Patient endorses loose stool, urinary frequency, and generalized fatigue.  Patient denies any dysuria, vaginal pain, vaginal discharge, blood abdominal pain, nausea, vomiting, fevers, chills, shortness of breath, syncope.  LMP 9/8.  G1 P0-0-1-0.  Patient is sexually active with female and female partners.   Vaginal Bleeding Associated symptoms: fatigue   Associated symptoms: no abdominal pain, no back pain, no dizziness, no dysuria, no fever, no nausea and no vaginal discharge       Past Medical History:  Diagnosis Date   Anxiety    Asthma    Fracture, pelvis closed (HCC)    Pneumothorax     Patient Active Problem List   Diagnosis Date Noted   Negative pregnancy test 06/23/2019   Exposure to STD 01/31/2012   Asthma 03/31/2011   Mood disorder (HCC) 03/31/2011    Past Surgical History:  Procedure Laterality Date   NO PAST SURGERIES       OB History     Gravida  2   Para      Term      Preterm      AB  2   Living         SAB  2   IAB      Ectopic      Multiple      Live Births           Obstetric Comments  Early SABs x 2 as teenager         Family History  Problem Relation Age of Onset    Cancer Mother        Living, brain tumor    Social History   Tobacco Use   Smoking status: Every Day    Packs/day: 0.25    Years: 5.00    Pack years: 1.25    Types: Cigarettes   Smokeless tobacco: Never  Vaping Use   Vaping Use: Never used  Substance Use Topics   Alcohol use: No   Drug use: No    Home Medications Prior to Admission medications   Medication Sig Start Date End Date Taking? Authorizing Provider  ibuprofen (ADVIL) 600 MG tablet Take 1 tablet (600 mg total) by mouth every 6 (six) hours as needed. 10/07/20   Domenick Gong, MD  metroNIDAZOLE (FLAGYL) 500 MG tablet Take 1 tablet (500 mg total) by mouth 2 (two) times daily. 10/09/20   Lamptey, Britta Mccreedy, MD  promethazine (PHENERGAN) 25 MG suppository Place 1 suppository (25 mg total) rectally every 6 (six) hours as needed for nausea or vomiting. 10/07/20   Domenick Gong, MD  promethazine (PHENERGAN) 25 MG tablet Take 1 tablet (25 mg total) by mouth every 6 (six) hours as needed for nausea or vomiting. 10/10/20  Wynetta Fines, MD    Allergies    Patient has no known allergies.  Review of Systems   Review of Systems  Constitutional:  Positive for fatigue. Negative for chills and fever.  Eyes:  Negative for visual disturbance.  Respiratory:  Negative for shortness of breath.   Cardiovascular:  Negative for chest pain.  Gastrointestinal:  Positive for diarrhea. Negative for abdominal pain, nausea and vomiting.  Genitourinary:  Positive for frequency, pelvic pain and vaginal bleeding. Negative for difficulty urinating, dysuria, flank pain, vaginal discharge and vaginal pain.  Musculoskeletal:  Negative for back pain and neck pain.  Skin:  Negative for color change and rash.  Neurological:  Negative for dizziness, syncope, light-headedness and headaches.  Psychiatric/Behavioral:  Negative for confusion.    Physical Exam Updated Vital Signs BP 103/66 (BP Location: Left Arm)   Pulse 73   Temp 97.6 F (36.4 C)  (Oral)   Resp 18   LMP 12/19/2020 (Approximate)   SpO2 100%   Physical Exam Vitals and nursing note reviewed. Exam conducted with a chaperone present (Female nurse tech present).  Constitutional:      General: She is not in acute distress.    Appearance: She is not ill-appearing, toxic-appearing or diaphoretic.  HENT:     Head: Normocephalic.  Eyes:     General: No scleral icterus.       Right eye: No discharge.        Left eye: No discharge.  Cardiovascular:     Rate and Rhythm: Normal rate.  Pulmonary:     Effort: Pulmonary effort is normal.  Abdominal:     General: Abdomen is flat. Bowel sounds are normal. There is no distension. There are no signs of injury.     Palpations: Abdomen is soft. There is no mass or pulsatile mass.     Tenderness: There is abdominal tenderness in the suprapubic area. There is no guarding or rebound.     Hernia: There is no hernia in the left inguinal area or right inguinal area.  Genitourinary:    Exam position: Lithotomy position.     Pubic Area: No rash or pubic lice.      Tanner stage (genital): 5.     Labia:        Right: No rash, tenderness, lesion or injury.        Left: No rash, tenderness, lesion or injury.      Vagina: No signs of injury and foreign body. Bleeding present. No vaginal discharge, erythema, tenderness, lesions or prolapsed vaginal walls.     Cervix: Cervical bleeding present. No cervical motion tenderness, discharge, friability, lesion, erythema or eversion.     Uterus: Tender. Not enlarged.      Adnexa:        Right: Tenderness present. No mass or fullness.         Left: Tenderness present. No mass or fullness.       Comments: Moderate amount of blood noted in vaginal vault.  Blood noted coming from cervical os.  Cervical os is closed.  No cervical motion tenderness.  Patient has tenderness.  Uterus and bilateral adnexa on bimanual exam. Lymphadenopathy:     Lower Body: No right inguinal adenopathy. No left inguinal  adenopathy.  Skin:    General: Skin is warm and dry.  Neurological:     General: No focal deficit present.     Mental Status: She is alert.  Psychiatric:        Behavior:  Behavior is cooperative.    ED Results / Procedures / Treatments   Labs (all labs ordered are listed, but only abnormal results are displayed) Labs Reviewed  CBC WITH DIFFERENTIAL/PLATELET - Abnormal; Notable for the following components:      Result Value   Hemoglobin 11.7 (*)    MCV 75.9 (*)    MCH 23.9 (*)    RDW 17.5 (*)    All other components within normal limits  URINALYSIS, ROUTINE W REFLEX MICROSCOPIC - Abnormal; Notable for the following components:   Hgb urine dipstick LARGE (*)    Bilirubin Urine SMALL (*)    RBC / HPF >50 (*)    All other components within normal limits  WET PREP, GENITAL  COMPREHENSIVE METABOLIC PANEL  LIPASE, BLOOD  I-STAT BETA HCG BLOOD, ED ([facility], [facility], [facility] ONLY)  I-STAT CHEM 8, ED  GC/CHLAMYDIA PROBE AMP ([place]) NOT AT [facility]    EKG None  Radiology No results found.  Procedures Procedures   Medications Ordered in ED Medications - No data to display  ED Course  I have reviewed the triage vital signs and the nursing notes.  Pertinent labs & imaging results that were available during my care of the patient were reviewed by me and considered in my medical decision making (see chart for details).    MDM Rules/Calculators/A&P                           Alert 30 year old female no acute distress, nontoxic-appearing.  Presents to ED with chief complaint of vaginal bleeding and pelvic pain.  Symptoms present over the last 2 days.  Pelvic exam showed moderate amounts of blood in vaginal vault coming from the cervical os.  Several os closed.  No cervical motion tenderness.  Tenderness to bilateral adnexa and uterus on bimanual exam.  No enlargement or fullness noted.  Low suspicion for PID as patient has no cervical motion tenderness,genital sores or lesions, or  vaginal discharge.  Patient denies any dysuria.  Will obtain beta-hCG, wet prep, gonorrhea chlamydia testing, CBC, CMP, lipase, and urinalysis.  We will obtain pelvic ultrasound to evaluate for patient's pelvic pain and rule out ovarian torsion.  Urinalysis shows no sign of infection Wet prep positive for WBC CMP unremarkable CBC shows anemia with hemoglobin 11.7  At this time pregnancy test and ultrasound imaging is pending.  If pregnancy test negative and ultrasound imaging shows no acute abnormality plan to discharge patient to follow-up with OB/GYN provider.  Patient care transferred to PA Gieple at the end of my shift. Patient presentation, ED course, and plan of care discussed with review of all pertinent labs and imaging. Please see his/her note for further details regarding further ED course and disposition.    Final Clinical Impression(s) / ED Diagnoses Final diagnoses:  None    Rx / DC Orders ED Discharge Orders     None        Berneice Heinrich 01/08/21 Gary Fleet, MD 01/08/21 1816

## 2021-01-08 NOTE — ED Notes (Signed)
Requested pain medication. Provider informed.In room yelling at her significant other. Ambulatory to bathroom.

## 2021-01-09 LAB — GC/CHLAMYDIA PROBE AMP (~~LOC~~) NOT AT ARMC
Chlamydia: NEGATIVE
Comment: NEGATIVE
Comment: NORMAL
Neisseria Gonorrhea: NEGATIVE

## 2021-01-12 ENCOUNTER — Ambulatory Visit: Payer: Self-pay

## 2021-01-21 ENCOUNTER — Ambulatory Visit (INDEPENDENT_AMBULATORY_CARE_PROVIDER_SITE_OTHER): Payer: Self-pay | Admitting: Family Medicine

## 2021-01-21 ENCOUNTER — Encounter: Payer: Self-pay | Admitting: Family Medicine

## 2021-01-21 ENCOUNTER — Other Ambulatory Visit: Payer: Self-pay

## 2021-01-21 VITALS — BP 112/74 | HR 102 | Wt 115.0 lb

## 2021-01-21 DIAGNOSIS — N9489 Other specified conditions associated with female genital organs and menstrual cycle: Secondary | ICD-10-CM

## 2021-01-21 DIAGNOSIS — R102 Pelvic and perineal pain: Secondary | ICD-10-CM

## 2021-01-21 HISTORY — DX: Other specified conditions associated with female genital organs and menstrual cycle: N94.89

## 2021-01-21 MED ORDER — GABAPENTIN 100 MG PO CAPS: 100.0000 mg | ORAL_CAPSULE | Freq: Three times a day (TID) | ORAL | 5 refills | Status: DC

## 2021-01-21 NOTE — Progress Notes (Signed)
GYNECOLOGY OFFICE VISIT NOTE  History:   Victoria Cline is a 30 y.o. G2P0020 here today for concern regarding cyst.  Patient seen in [facility] ED on 01/08/21 Presented with vaginal bleeding and pelvic pain on the RIGHT side No CMT on exam but some bilateral pelvic pain on bimanual Wet prep, GC/CT, hcg, UA, were all unremarkable/negative TVUS obtained which showed irregular septated cyst on the LEFT side She reports a history of ovarian cancer in her sister at age 29, possibly discovered after abnormal findings during a cesarean delivery but she does not know the details She reports the pain has been present for over a month now Period has recently been regular but in the past has been very irregular and very painful Reports she has tried many different OTC medications and tramadol for pain, none very effective Percocet in ED was effective  Health Maintenance Due  Topic Date Due   COVID-19 Vaccine (1) Never done   Hepatitis C Screening  Never done   TETANUS/TDAP  Never done   PAP-Cervical Cytology Screening  Never done   PAP SMEAR-Modifier  Never done   INFLUENZA VACCINE  Never done    Past Medical History:  Diagnosis Date   Anxiety    Asthma    Fracture, pelvis closed (HCC)    Pneumothorax     Past Surgical History:  Procedure Laterality Date   NO PAST SURGERIES      The following portions of the patient's history were reviewed and updated as appropriate: allergies, current medications, past family history, past medical history, past social history, past surgical history and problem list.   Health Maintenance:   Last pap: No results found for: DIAGPAP, HPV, HPVHIGH   Last mammogram:  N/a    Review of Systems:  Pertinent items noted in HPI and remainder of comprehensive ROS otherwise negative.  Physical Exam:  BP 112/74   Pulse (!) 102   Wt 115 lb (52.2 kg)   LMP 12/19/2020   BMI 20.37 kg/m  CONSTITUTIONAL: Well-developed, well-nourished female in  no acute distress.  HEENT:  Normocephalic, atraumatic. External right and left ear normal. No scleral icterus.  NECK: Normal range of motion, supple, no masses noted on observation SKIN: No rash noted. Not diaphoretic. No erythema. No pallor. MUSCULOSKELETAL: Normal range of motion. No edema noted. NEUROLOGIC: Alert and oriented to person, place, and time. Normal muscle tone coordination.  PSYCHIATRIC: Normal mood and affect. Normal behavior. Normal judgment and thought content. RESPIRATORY: Effort normal, no problems with respiration noted ABDOMEN: diffuse mild tenderness to palpation without rebound or guarding, no masses appreciated  Labs and Imaging Results for orders placed or performed in visit on 01/21/21 (from the past 168 hour(s))  CA 125   Collection Time: 01/21/21  4:20 PM  Result Value Ref Range   Cancer Antigen (CA) 125 5.3 0.0 - 38.1 U/mL   US Transvaginal Non-OB  Result Date: 01/08/2021 CLINICAL DATA:  Pelvic pain.  Vaginal bleeding. EXAM: TRANSABDOMINAL AND TRANSVAGINAL ULTRASOUND OF PELVIS DOPPLER ULTRASOUND OF OVARIES TECHNIQUE: Both transabdominal and transvaginal ultrasound examinations of the pelvis were performed. Transabdominal technique was performed for global imaging of the pelvis including uterus, ovaries, adnexal regions, and pelvic cul-de-sac. It was necessary to proceed with endovaginal exam following the transabdominal exam to visualize the adnexa. Color and duplex Doppler ultrasound was utilized to evaluate blood flow to the ovaries. COMPARISON:  None. FINDINGS: Uterus Measurements: 7.4 x 3.8 x 4.0 cm = volume: 59 mL. No fibroids  or other mass visualized. Endometrium Thickness: 9.5 mm.  No focal abnormality visualized. Right ovary Measurements: 3.2 x 2.2 x 2.4 cm = volume: 8.8 mL. Normal appearance/no adnexal mass. Follicles are distributed peripherally. Left ovary Measurements: 6.2 x 4.2 x 7.2 cm = volume: 98.6 mL. A septated irregular walled cyst measures 5.8 x  3.8 x 6.8 cm. Pulsed Doppler evaluation of both ovaries demonstrates normal low-resistance arterial and venous waveforms. Other findings Moderate amount of free fluid is present. Prominent uterine vessels noted bilaterally, left greater than right IMPRESSION: 1. Enlarged septated irregular cyst of the left adnexa measures 5.8 x 3.8 x 6.8 cm recommend follow-up US in 3-6 months. Note: This recommendation does not apply to premenarchal patients or to those with increased risk (genetic, family history, elevated tumor markers or other high-risk factors) of ovarian cancer. Reference: Radiology 2019 Nov; 293(2):359-371. 2. Normal color Doppler flow, arterial and venous waveforms without evidence for ovarian torsion. 3. Normal sonographic appearance of the uterus. Electronically Signed   By: Marin Roberts M.D.   On: 01/08/2021 16:47   US Pelvis Complete  Result Date: 01/08/2021 CLINICAL DATA:  Pelvic pain.  Vaginal bleeding. EXAM: TRANSABDOMINAL AND TRANSVAGINAL ULTRASOUND OF PELVIS DOPPLER ULTRASOUND OF OVARIES TECHNIQUE: Both transabdominal and transvaginal ultrasound examinations of the pelvis were performed. Transabdominal technique was performed for global imaging of the pelvis including uterus, ovaries, adnexal regions, and pelvic cul-de-sac. It was necessary to proceed with endovaginal exam following the transabdominal exam to visualize the adnexa. Color and duplex Doppler ultrasound was utilized to evaluate blood flow to the ovaries. COMPARISON:  None. FINDINGS: Uterus Measurements: 7.4 x 3.8 x 4.0 cm = volume: 59 mL. No fibroids or other mass visualized. Endometrium Thickness: 9.5 mm.  No focal abnormality visualized. Right ovary Measurements: 3.2 x 2.2 x 2.4 cm = volume: 8.8 mL. Normal appearance/no adnexal mass. Follicles are distributed peripherally. Left ovary Measurements: 6.2 x 4.2 x 7.2 cm = volume: 98.6 mL. A septated irregular walled cyst measures 5.8 x 3.8 x 6.8 cm. Pulsed Doppler evaluation  of both ovaries demonstrates normal low-resistance arterial and venous waveforms. Other findings Moderate amount of free fluid is present. Prominent uterine vessels noted bilaterally, left greater than right IMPRESSION: 1. Enlarged septated irregular cyst of the left adnexa measures 5.8 x 3.8 x 6.8 cm recommend follow-up US in 3-6 months. Note: This recommendation does not apply to premenarchal patients or to those with increased risk (genetic, family history, elevated tumor markers or other high-risk factors) of ovarian cancer. Reference: Radiology 2019 Nov; 293(2):359-371. 2. Normal color Doppler flow, arterial and venous waveforms without evidence for ovarian torsion. 3. Normal sonographic appearance of the uterus. Electronically Signed   By: Marin Roberts M.D.   On: 01/08/2021 16:47   [place] Art/Ven Flow Abd Pelv Doppler  Result Date: 01/08/2021 CLINICAL DATA:  Pelvic pain.  Vaginal bleeding. EXAM: TRANSABDOMINAL AND TRANSVAGINAL ULTRASOUND OF PELVIS DOPPLER ULTRASOUND OF OVARIES TECHNIQUE: Both transabdominal and transvaginal ultrasound examinations of the pelvis were performed. Transabdominal technique was performed for global imaging of the pelvis including uterus, ovaries, adnexal regions, and pelvic cul-de-sac. It was necessary to proceed with endovaginal exam following the transabdominal exam to visualize the adnexa. Color and duplex Doppler ultrasound was utilized to evaluate blood flow to the ovaries. COMPARISON:  None. FINDINGS: Uterus Measurements: 7.4 x 3.8 x 4.0 cm = volume: 59 mL. No fibroids or other mass visualized. Endometrium Thickness: 9.5 mm.  No focal abnormality visualized. Right ovary Measurements: 3.2 x 2.2 x 2.4  cm = volume: 8.8 mL. Normal appearance/no adnexal mass. Follicles are distributed peripherally. Left ovary Measurements: 6.2 x 4.2 x 7.2 cm = volume: 98.6 mL. A septated irregular walled cyst measures 5.8 x 3.8 x 6.8 cm. Pulsed Doppler evaluation of both ovaries  demonstrates normal low-resistance arterial and venous waveforms. Other findings Moderate amount of free fluid is present. Prominent uterine vessels noted bilaterally, left greater than right IMPRESSION: 1. Enlarged septated irregular cyst of the left adnexa measures 5.8 x 3.8 x 6.8 cm recommend follow-up US in 3-6 months. Note: This recommendation does not apply to premenarchal patients or to those with increased risk (genetic, family history, elevated tumor markers or other high-risk factors) of ovarian cancer. Reference: Radiology 2019 Nov; 293(2):359-371. 2. Normal color Doppler flow, arterial and venous waveforms without evidence for ovarian torsion. 3. Normal sonographic appearance of the uterus. Electronically Signed   By: Marin Roberts M.D.   On: 01/08/2021 16:47      Assessment and Plan:   Problem List Items Addressed This Visit       Other   Adnexal mass - Primary    Patient presenting with two likely unrelated complaints of L adnexal mass and pelvic pain. Reports pain is mostly R sided but adnexal mass is L sided, discussed that this makes it less likely that this is the primary cause of her pain. CA-125 obtained on day of visit which was normal (5.3). Will repeat imaging in about 6 weeks to re-assess for progression. Will also message Gyn Onc to see if they would prefer to see patient in an expedited manner given possible family hx of ovarian cancer at a young age.       Relevant Orders   CA 125 (Completed)   US Transvaginal Non-OB   Pelvic pain    Long history of pelvic pain. On review of chart appears that there has been some imaging findings that raise suspicion for pelvic congestion syndrome in the past. Patient was unaware of this, she is amenable to a referral to Interventional Radiology for further evaluation. Endometriosis is another possibility but she does not notice any cyclical nature to her symptoms so I think this is much less likely. Unfortunately somewhat limited in  treatment options, discussed with patient that opioids would be inappropriate for this problem. Suggested trial of gabapentin, she is amenable.       Relevant Medications   gabapentin (NEURONTIN) 100 MG capsule   Other Relevant Orders   Ambulatory referral to Interventional Radiology     Routine preventative health maintenance measures emphasized. Please refer to After Visit Summary for other counseling recommendations.   Return in about 6 weeks (around 03/04/2021) for follow up pelvic pain.    Total face-to-face time with patient: 30 minutes.  Over 50% of encounter was spent on counseling and coordination of care.   Venora Maples, MD/MPH Attending Family Medicine Physician, [facility] for [facility], [facility]

## 2021-01-22 DIAGNOSIS — R102 Pelvic and perineal pain: Secondary | ICD-10-CM

## 2021-01-22 HISTORY — DX: Pelvic and perineal pain: R10.2

## 2021-01-22 LAB — CA 125: Cancer Antigen (CA) 125: 5.3 U/mL (ref 0.0–38.1)

## 2021-01-22 NOTE — Assessment & Plan Note (Addendum)
Patient presenting with two likely unrelated complaints of L adnexal mass and pelvic pain. Reports pain is mostly R sided but adnexal mass is L sided, discussed that this makes it less likely that this is the primary cause of her pain. CA-125 obtained on day of visit which was normal (5.3). Will repeat imaging in about 6 weeks to re-assess for progression. Will also message Gyn Onc to see if they would prefer to see patient in an expedited manner given possible family hx of ovarian cancer at a young age.

## 2021-01-22 NOTE — Assessment & Plan Note (Signed)
Long history of pelvic pain. On review of chart appears that there has been some imaging findings that raise suspicion for pelvic congestion syndrome in the past. Patient was unaware of this, she is amenable to a referral to Interventional Radiology for further evaluation. Endometriosis is another possibility but she does not notice any cyclical nature to her symptoms so I think this is much less likely. Unfortunately somewhat limited in treatment options, discussed with patient that opioids would be inappropriate for this problem. Suggested trial of gabapentin, she is amenable.

## 2021-01-30 ENCOUNTER — Telehealth: Payer: Self-pay | Admitting: General Practice

## 2021-01-30 DIAGNOSIS — R102 Pelvic and perineal pain: Secondary | ICD-10-CM

## 2021-01-30 MED ORDER — GABAPENTIN 100 MG PO CAPS: 200.0000 mg | ORAL_CAPSULE | Freq: Three times a day (TID) | ORAL | 0 refills | Status: DC

## 2021-01-30 NOTE — Telephone Encounter (Signed)
Called and informed patient of CA125 results and inquired about sister's past medical history. Patient verbalized understanding and states her sister previously has cervical cancer and had an in office procedure where they removed a small portion of the abnormal area but not the whole thing. Told patient Dr Crissie Reese thought she was referring to ovarian cancer which would be a different set of recommendations. Patient states the gabapentin only helps for about 30 minutes then the pain comes back. Told patient I would discuss with Dr Crissie Reese and call her back later. Patient verbalized understanding.  Spoke with Dr Crissie Reese who advises patient increase gabapentin to 200mg  TID and she should also have follow up ultrasound before next office visit.   Called patient back and discussed with her. New Rx placed for increased gabapentin dose. Scheduled ultrasound 11/14 @ 1100. Advised patient to try new dose for a week and to call 12/14 back if dose is ineffective. Patient verbalized understanding to all.

## 2021-01-30 NOTE — Progress Notes (Signed)
Patient called.  Patient aware. Patient was informed that her test was normal. Sister was present and reports she had abnormal ovaries and cysts that needed to be removed. She does not have a history of cancer. Their mom had Non Hodgkins Lymphoma, dad Lung cancer.

## 2021-02-24 ENCOUNTER — Other Ambulatory Visit: Payer: Self-pay

## 2021-02-24 ENCOUNTER — Ambulatory Visit
Admission: RE | Admit: 2021-02-24 | Discharge: 2021-02-24 | Disposition: A | Discharge status: Home / Self Care | Payer: 59 | Source: Ambulatory Visit | Attending: Family Medicine | Admitting: Family Medicine

## 2021-02-24 DIAGNOSIS — N9489 Other specified conditions associated with female genital organs and menstrual cycle: Secondary | ICD-10-CM

## 2021-02-28 ENCOUNTER — Other Ambulatory Visit: Payer: Self-pay

## 2021-02-28 ENCOUNTER — Encounter (HOSPITAL_COMMUNITY): Payer: Self-pay | Admitting: Emergency Medicine

## 2021-02-28 ENCOUNTER — Emergency Department (HOSPITAL_COMMUNITY): Payer: 59

## 2021-02-28 ENCOUNTER — Emergency Department (HOSPITAL_COMMUNITY)
Admission: EM | Admit: 2021-02-28 | Discharge: 2021-02-28 | Disposition: A | Discharge status: Home / Self Care | Payer: 59 | Attending: Emergency Medicine | Admitting: Emergency Medicine

## 2021-02-28 DIAGNOSIS — R509 Fever, unspecified: Secondary | ICD-10-CM | POA: Insufficient documentation

## 2021-02-28 DIAGNOSIS — F17210 Nicotine dependence, cigarettes, uncomplicated: Secondary | ICD-10-CM | POA: Diagnosis not present

## 2021-02-28 DIAGNOSIS — R0989 Other specified symptoms and signs involving the circulatory and respiratory systems: Secondary | ICD-10-CM | POA: Insufficient documentation

## 2021-02-28 DIAGNOSIS — R102 Pelvic and perineal pain: Secondary | ICD-10-CM | POA: Insufficient documentation

## 2021-02-28 DIAGNOSIS — J029 Acute pharyngitis, unspecified: Secondary | ICD-10-CM | POA: Diagnosis not present

## 2021-02-28 DIAGNOSIS — Z20822 Contact with and (suspected) exposure to covid-19: Secondary | ICD-10-CM | POA: Insufficient documentation

## 2021-02-28 DIAGNOSIS — J45909 Unspecified asthma, uncomplicated: Secondary | ICD-10-CM | POA: Diagnosis not present

## 2021-02-28 DIAGNOSIS — R0981 Nasal congestion: Secondary | ICD-10-CM | POA: Diagnosis not present

## 2021-02-28 DIAGNOSIS — R1011 Right upper quadrant pain: Secondary | ICD-10-CM

## 2021-02-28 DIAGNOSIS — R109 Unspecified abdominal pain: Secondary | ICD-10-CM | POA: Diagnosis present

## 2021-02-28 DIAGNOSIS — R1115 Cyclical vomiting syndrome unrelated to migraine: Secondary | ICD-10-CM | POA: Diagnosis not present

## 2021-02-28 LAB — COMPREHENSIVE METABOLIC PANEL
ALT: 23 U/L (ref 0–44)
AST: 24 U/L (ref 15–41)
Albumin: 4.7 g/dL (ref 3.5–5.0)
Alkaline Phosphatase: 46 U/L (ref 38–126)
Anion gap: 11 (ref 5–15)
BUN: 14 mg/dL (ref 6–20)
CO2: 22 mmol/L (ref 22–32)
Calcium: 9.5 mg/dL (ref 8.9–10.3)
Chloride: 104 mmol/L (ref 98–111)
Creatinine, Ser: 0.8 mg/dL (ref 0.44–1.00)
GFR, Estimated: >60 mL/min (ref >60)
Glucose, Bld: 135 mg/dL — ABNORMAL HIGH (ref 70–99)
Potassium: 3.7 mmol/L (ref 3.5–5.1)
Sodium: 137 mmol/L (ref 135–145)
Total Bilirubin: 0.9 mg/dL (ref 0.3–1.2)
Total Protein: 8.6 g/dL — ABNORMAL HIGH (ref 6.5–8.1)

## 2021-02-28 LAB — RESP PANEL BY RT-PCR (FLU A&B, COVID) ARPGX2
Influenza A by PCR: NEGATIVE
Influenza B by PCR: NEGATIVE
SARS Coronavirus 2 by RT PCR: NEGATIVE

## 2021-02-28 LAB — CBC WITH DIFFERENTIAL/PLATELET
Abs Immature Granulocytes: 0.03 10*3/uL (ref 0.00–0.07)
Basophils Absolute: 0 10*3/uL (ref 0.0–0.1)
Basophils Relative: 0 %
Eosinophils Absolute: 0 10*3/uL (ref 0.0–0.5)
Eosinophils Relative: 0 %
HCT: 34.4 % — ABNORMAL LOW (ref 36.0–46.0)
Hemoglobin: 11.3 g/dL — ABNORMAL LOW (ref 12.0–15.0)
Immature Granulocytes: 0 %
Lymphocytes Relative: 10 %
Lymphs Abs: 1 10*3/uL (ref 0.7–4.0)
MCH: 23.5 pg — ABNORMAL LOW (ref 26.0–34.0)
MCHC: 32.8 g/dL (ref 30.0–36.0)
MCV: 71.7 fL — ABNORMAL LOW (ref 80.0–100.0)
Monocytes Absolute: 0.3 10*3/uL (ref 0.1–1.0)
Monocytes Relative: 3 %
Neutro Abs: 8 10*3/uL — ABNORMAL HIGH (ref 1.7–7.7)
Neutrophils Relative %: 87 %
Platelets: 345 10*3/uL (ref 150–400)
RBC: 4.8 MIL/uL (ref 3.87–5.11)
RDW: 16.4 % — ABNORMAL HIGH (ref 11.5–15.5)
WBC: 9.3 10*3/uL (ref 4.0–10.5)
nRBC: 0 % (ref 0.0–0.2)

## 2021-02-28 LAB — I-STAT BETA HCG BLOOD, ED (MC, WL, AP ONLY): I-stat hCG, quantitative: <5 m[IU]/mL (ref <5)

## 2021-02-28 LAB — LIPASE, BLOOD: Lipase: 78 U/L — ABNORMAL HIGH (ref 11–51)

## 2021-02-28 MED ORDER — OMEPRAZOLE 20 MG PO CPDR: 20.0000 mg | DELAYED_RELEASE_CAPSULE | Freq: Every day | ORAL | 0 refills | Status: DC

## 2021-02-28 MED ORDER — ALUM & MAG HYDROXIDE-SIMETH 200-200-20 MG/5ML PO SUSP
30.0000 mL | Freq: Once | ORAL | Status: AC
  Administered 2021-02-28: 30 mL via ORAL
  Filled 2021-02-28: qty 30

## 2021-02-28 MED ORDER — ONDANSETRON HCL 4 MG/2ML IJ SOLN
4.0000 mg | Freq: Once | INTRAMUSCULAR | Status: AC
  Administered 2021-02-28: 4 mg via INTRAVENOUS
  Filled 2021-02-28: qty 2

## 2021-02-28 MED ORDER — SODIUM CHLORIDE 0.9 % IV BOLUS
1000.0000 mL | Freq: Once | INTRAVENOUS | Status: AC
  Administered 2021-02-28: 1000 mL via INTRAVENOUS

## 2021-02-28 MED ORDER — SUCRALFATE 1 G PO TABS: 1.0000 g | ORAL_TABLET | Freq: Three times a day (TID) | ORAL | 0 refills | Status: DC

## 2021-02-28 MED ORDER — MORPHINE SULFATE (PF) 2 MG/ML IV SOLN
2.0000 mg | Freq: Once | INTRAVENOUS | Status: AC
  Administered 2021-02-28: 2 mg via INTRAVENOUS
  Filled 2021-02-28: qty 1

## 2021-02-28 MED ORDER — ONDANSETRON 4 MG PO TBDP: 4.0000 mg | ORAL_TABLET | Freq: Three times a day (TID) | ORAL | 0 refills | Status: DC | PRN

## 2021-02-28 MED ORDER — LIDOCAINE VISCOUS HCL 2 % MT SOLN
15.0000 mL | Freq: Once | OROMUCOSAL | Status: AC
  Administered 2021-02-28: 15 mL via ORAL
  Filled 2021-02-28: qty 15

## 2021-02-28 NOTE — Discharge Instructions (Addendum)
As we discussed please follow-up if your pain gets significantly worse, you cannot tolerate eating or drinking despite nausea medication.  Please follow-up with gastroenterology at your earliest convenience for further evaluation.  I do encourage you to stick to a bland diet including toast, applesauce, bananas, rice for the next few days, drink plenty of fluids, cut back on marijuana smoking.

## 2021-02-28 NOTE — ED Provider Notes (Signed)
[facility]-EMERGENCY DEPT Provider Note   CSN: [id number] Arrival date & time: 02/28/21  7846     History Chief Complaint  Patient presents with   Abdominal Pain   Emesis    Victoria Cline is a 30 y.o. female with past medical history of adnexal mass noted by patient, with history of pelvic pain who presents with 4 days of nausea, vomiting, sharp, cramping abdominal pain.  Patient also endorses fever, chills without cough sore throat, runny nose, congestion.  Patient reports that she is currently on her menstrual period but does not feel dissimilar to her normal menstrual periods, patient denies sexual activity at this time.  Patient denies vaginal discharge, dysuria, hematuria.  Patient denies diarrhea, constipation.  Patient reports she has not tried anything for the pain at this time, but feels it is getting worse.  Patient reports that she vomits at least every 1 to 1-1/2 hours, it is interrupting her sleep.  Patient denies history of abdominal surgery, history of something similar.  Patient reports pain is 9/10 at this time.   Abdominal Pain Associated symptoms: nausea and vomiting   Emesis Associated symptoms: abdominal pain       Past Medical History:  Diagnosis Date   Anxiety    Asthma    Fracture, pelvis closed (HCC)    Pneumothorax     Patient Active Problem List   Diagnosis Date Noted   Pelvic pain 01/22/2021   Adnexal mass 01/21/2021   Negative pregnancy test 06/23/2019   Exposure to STD 01/31/2012   Asthma 03/31/2011   Mood disorder (HCC) 03/31/2011    Past Surgical History:  Procedure Laterality Date   NO PAST SURGERIES       OB History     Gravida  2   Para      Term      Preterm      AB  2   Living         SAB  2   IAB      Ectopic      Multiple      Live Births           Obstetric Comments  Early SABs x 2 as teenager         Family History  Problem Relation Age of Onset   Cancer Mother         Living, brain tumor    Social History   Tobacco Use   Smoking status: Every Day    Packs/day: 0.25    Years: 5.00    Pack years: 1.25    Types: Cigarettes   Smokeless tobacco: Never  Vaping Use   Vaping Use: Never used  Substance Use Topics   Alcohol use: Yes    Comment: occasional   Drug use: No    Home Medications Prior to Admission medications   Medication Sig Start Date End Date Taking? Authorizing Provider  omeprazole (PRILOSEC) 20 MG capsule Take 1 capsule (20 mg total) by mouth daily. 02/28/21  Yes Rhys Anchondo H, PA-C  ondansetron (ZOFRAN ODT) 4 MG disintegrating tablet Take 1 tablet (4 mg total) by mouth every 8 (eight) hours as needed for nausea or vomiting. 02/28/21  Yes Finnian Husted H, PA-C  sucralfate (CARAFATE) 1 g tablet Take 1 tablet (1 g total) by mouth 4 (four) times daily -  with meals and at bedtime. 02/28/21  Yes Arlone Lenhardt H, PA-C  gabapentin (NEURONTIN) 100 MG capsule Take 2 capsules (  200 mg total) by mouth 3 (three) times daily. 01/30/21   Clarnce Flock, MD  ibuprofen (ADVIL) 400 MG tablet SMARTSIG:1-2 Tablet(s) By Mouth 3-4 Times Daily PRN 12/12/20   [provider]  ibuprofen (ADVIL) 600 MG tablet Take 1 tablet (600 mg total) by mouth every 6 (six) hours as needed. 10/07/20   Melynda Ripple, MD  LORazepam (ATIVAN) 1 MG tablet Take by mouth. 12/13/20   [provider]  metroNIDAZOLE (FLAGYL) 500 MG tablet Take 1 tablet (500 mg total) by mouth 2 (two) times daily. 10/09/20   Lamptey, Myrene Galas, MD  promethazine (PHENERGAN) 25 MG suppository Place 1 suppository (25 mg total) rectally every 6 (six) hours as needed for nausea or vomiting. 10/07/20   Melynda Ripple, MD  promethazine (PHENERGAN) 25 MG tablet Take 1 tablet (25 mg total) by mouth every 6 (six) hours as needed for nausea or vomiting. 10/10/20   Valarie Merino, MD    Allergies    Patient has no known allergies.  Review of Systems   Review of  Systems  Gastrointestinal:  Positive for abdominal pain, nausea and vomiting.  All other systems reviewed and are negative.  Physical Exam Updated Vital Signs BP 114/68   Pulse 67   Temp 98.3 F (36.8 C) (Oral)   Resp 18   Wt 61.2 kg   LMP 02/27/2021   SpO2 100%   BMI 23.91 kg/m   Physical Exam Vitals and nursing note reviewed.  Constitutional:      General: She is in acute distress.     Appearance: Normal appearance. She is ill-appearing.     Comments: Somewhat ill-appearing young female sitting on a bed in some distress, clutching her abdomen due to crampy abdominal pain  HENT:     Head: Normocephalic and atraumatic.  Eyes:     General:        Right eye: No discharge.        Left eye: No discharge.  Cardiovascular:     Rate and Rhythm: Normal rate and regular rhythm.     Heart sounds: No murmur heard.   No friction rub. No gallop.  Pulmonary:     Effort: Pulmonary effort is normal.     Breath sounds: Normal breath sounds.  Abdominal:     General: Bowel sounds are normal.     Palpations: Abdomen is soft.     Comments: Patient with tenderness to palpation generalized, versus epigastric pattern.  No Murphy sign, no McBurney's point tenderness, negative Rovsing sign.  No rebound, rigidity, guarding.  Musculoskeletal:     Right lower leg: No edema.     Left lower leg: No edema.  Skin:    General: Skin is warm and dry.     Capillary Refill: Capillary refill takes less than 2 seconds.  Neurological:     Mental Status: She is alert and oriented to person, place, and time.  Psychiatric:        Mood and Affect: Mood normal.        Behavior: Behavior normal.    ED Results / Procedures / Treatments   Labs (all labs ordered are listed, but only abnormal results are displayed) Labs Reviewed  CBC WITH DIFFERENTIAL/PLATELET - Abnormal; Notable for the following components:      Result Value   Hemoglobin 11.3 (*)    HCT 34.4 (*)    MCV 71.7 (*)    MCH 23.5 (*)    RDW  16.4 (*)  Neutro Abs 8.0 (*)    All other components within normal limits  COMPREHENSIVE METABOLIC PANEL - Abnormal; Notable for the following components:   Glucose, Bld 135 (*)    Total Protein 8.6 (*)    All other components within normal limits  LIPASE, BLOOD - Abnormal; Notable for the following components:   Lipase 78 (*)    All other components within normal limits  RESP PANEL BY RT-PCR (FLU A&B, COVID) ARPGX2  URINE CULTURE  URINALYSIS, ROUTINE W REFLEX MICROSCOPIC  I-STAT BETA HCG BLOOD, ED ([facility], [facility], [facility] ONLY)    EKG None  Radiology US Abdomen Limited RUQ (LIVER/GB)  Result Date: 02/28/2021 CLINICAL DATA:  Right upper quadrant pain, nausea, vomiting for 3 weeks EXAM: ULTRASOUND ABDOMEN LIMITED RIGHT UPPER QUADRANT COMPARISON:  Right upper quadrant ultrasound 10/26/2016 FINDINGS: Gallbladder: No gallstones or wall thickening visualized. No sonographic Murphy sign noted by sonographer. A few gallbladder polyps are noted measuring up to 4 mm, not significantly changed since 2018. Common bile duct: Diameter: 3 mm Liver: No focal lesion identified. Parenchymal echogenicity is mildly increased. Portal vein is patent on color Doppler imaging with normal direction of blood flow towards the liver. Other: None. IMPRESSION: 1. No cholelithiasis or evidence of acute cholecystitis. 2. Gallbladder polyps measuring up to 4 mm, not significantly changed since 2018. 3. Suspect mild fatty infiltration of the liver. Electronically Signed   By: Valetta Mole M.D.   On: 02/28/2021 11:18    Procedures Procedures   Medications Ordered in ED Medications  ondansetron (ZOFRAN) injection 4 mg (4 mg Intravenous Given 02/28/21 0847)  morphine 2 MG/ML injection 2 mg (2 mg Intravenous Given 02/28/21 0847)  alum & mag hydroxide-simeth (MAALOX/MYLANTA) 200-200-20 MG/5ML suspension 30 mL (30 mLs Oral Given 02/28/21 0848)    And  lidocaine (XYLOCAINE) 2 % viscous mouth solution 15 mL (15 mLs Oral Given  02/28/21 0848)  sodium chloride 0.9 % bolus 1,000 mL (0 mLs Intravenous Stopped 02/28/21 1107)  ondansetron (ZOFRAN) injection 4 mg (4 mg Intravenous Given 02/28/21 1100)  morphine 2 MG/ML injection 2 mg (2 mg Intravenous Given 02/28/21 1104)  ondansetron (ZOFRAN) injection 4 mg (4 mg Intravenous Given 02/28/21 1230)    ED Course  I have reviewed the triage vital signs and the nursing notes.  Pertinent labs & imaging results that were available during my care of the patient were reviewed by me and considered in my medical decision making (see chart for details).    MDM Rules/Calculators/A&P                         I discussed this case with my attending physician who cosigned this note including patient's presenting symptoms, physical exam, and planned diagnostics and interventions. Attending physician stated agreement with plan or made changes to plan which were implemented.   Somewhat ill-appearing 30 year old female with 4 days of abdominal pain, nausea, vomiting.  Some fevers and chills.  We will run respiratory virus panel to evaluate for infectious cause.  We will run abdominal, urinary labs.  This patient does not have significant focal tenderness, young age, no history of intra-abdominal surgery minimal concern for acute, surgical abdomen at this time, will hold off on CT scan.  We will give GI cocktail, control pain, control nausea during evaluation of symptoms.  Minimal concern for urinary, or pelvic symptoms at this time, as patient denies any dysuria, hematuria, pelvic pain, vaginal discharge, vaginal bleeding.  Patient denies sexual activity  in the last 6 weeks.  Initial lab work significant for mild microcytic, likely iron deficiency anemia, no elevated white count.  Minimally elevated lipase, however not 3 times upper limit of normal.  Right upper quadrant ultrasound without any evidence of acute findings.  Patient continues to have no focal tenderness, does not appear to have an  acute abdomen, or surgical abdomen at this time.  Patient does endorse frequent marijuana use, increased concern for cyclic vomiting syndrome due to marijuana use.  Differential includes gastroenteritis, gastritis, gastroparesis.  Encouraged her to limit or completely stop using marijuana at this time.  Will prescribe omeprazole, sucralfate, and Zofran at this time.  Encourage follow-up with gastroenterology if her symptoms continue without improvement.  Encouraged to return to the emergency department for symptoms worsen for repeat CT scan.  Did look at an old CT scan which showed some stomach wall thickening which may be consistent with chronic gastric inflammation.  Patient able to tolerate fluids at this time, given 1 more dose of nausea medication before she leaves.  Significantly improved clinical appearance prior to discharge.  Encouraged bland diet, adequate fluid rehydration. Patient discharged in stable condition, return precautions were given. Final Clinical Impression(s) / ED Diagnoses Final diagnoses:  RUQ abdominal pain  Cyclical vomiting syndrome not associated with migraine    Rx / DC Orders ED Discharge Orders          Ordered    ondansetron (ZOFRAN ODT) 4 MG disintegrating tablet  Every 8 hours PRN        02/28/21 1227    sucralfate (CARAFATE) 1 g tablet  3 times daily with meals & bedtime        02/28/21 1227    omeprazole (PRILOSEC) 20 MG capsule  Daily        02/28/21 1227             Anselmo Pickler, PA-C 02/28/21 Acampo, Alexander, MD 03/01/21 [phone]

## 2021-02-28 NOTE — ED Triage Notes (Signed)
Pt in with low abdominal, spasm-like pain x 4 days, along with n/v for same. States she's been unable to keep anything down for days. Denies any fevers or diarrhea. C/o generalized weakness

## 2021-03-10 ENCOUNTER — Ambulatory Visit: Payer: Self-pay | Admitting: Obstetrics & Gynecology

## 2021-03-11 ENCOUNTER — Other Ambulatory Visit: Payer: Self-pay

## 2021-03-11 ENCOUNTER — Other Ambulatory Visit: Payer: Self-pay | Admitting: Family Medicine

## 2021-03-11 ENCOUNTER — Encounter: Payer: Self-pay | Admitting: Family Medicine

## 2021-03-11 ENCOUNTER — Ambulatory Visit (INDEPENDENT_AMBULATORY_CARE_PROVIDER_SITE_OTHER): Payer: 59 | Admitting: Family Medicine

## 2021-03-11 VITALS — BP 105/62 | HR 90 | Ht 63.0 in | Wt 115.8 lb

## 2021-03-11 DIAGNOSIS — N9489 Other specified conditions associated with female genital organs and menstrual cycle: Secondary | ICD-10-CM

## 2021-03-11 DIAGNOSIS — R102 Pelvic and perineal pain: Secondary | ICD-10-CM

## 2021-03-11 MED ORDER — AMITRIPTYLINE HCL 10 MG PO TABS: 10.0000 mg | ORAL_TABLET | Freq: Every day | ORAL | 5 refills | Status: DC

## 2021-03-11 NOTE — Patient Instructions (Signed)
Call [facility] (Phone: [phone]) to get an appointment with the Interventional Radiologist, let them know you already have an appointment.

## 2021-03-11 NOTE — Progress Notes (Signed)
GYNECOLOGY OFFICE VISIT NOTE  History:   Victoria Cline is a 30 y.o. G2P0020 here today for follow up of pelvic pain and adnexal mass.  Last seen 01/21/21 at that time for ED follow up of adnexal mass seen on [place] Follow up US scheduled which was completed on 02/24/21, CA-125 was normal, referred for IR due to prior imaging suggestive of pelvic congestion syndrome Also trialled gabapentin for chronic abdominal pain  Trialed gabapentin, was not effective Still having pelvic pain Still more L>R Has not been contacted by IR yet  Health Maintenance Due  Topic Date Due   COVID-19 Vaccine (1) Never done   Pneumococcal Vaccine 38-39 Years old (1 - PCV) Never done   Hepatitis C Screening  Never done   TETANUS/TDAP  Never done   PAP-Cervical Cytology Screening  Never done   PAP SMEAR-Modifier  Never done    Past Medical History:  Diagnosis Date   Anxiety    Asthma    Fracture, pelvis closed (HCC)    Pneumothorax     Past Surgical History:  Procedure Laterality Date   NO PAST SURGERIES      The following portions of the patient's history were reviewed and updated as appropriate: allergies, current medications, past family history, past medical history, past social history, past surgical history and problem list.   Health Maintenance:   Last pap: No results found for: DIAGPAP, HPV, HPVHIGH  NEEDS, address next visit  Last mammogram:  N/A    Review of Systems:  Pertinent items noted in HPI and remainder of comprehensive ROS otherwise negative.  Physical Exam:  BP 105/62   Pulse 90   Ht 5\' 3"  (1.6 m)   Wt 115 lb 12.8 oz (52.5 kg)   LMP 02/27/2021 (Approximate)   BMI 20.51 kg/m  CONSTITUTIONAL: Well-developed, well-nourished female in no acute distress.  HEENT:  Normocephalic, atraumatic. External right and left ear normal. No scleral icterus.  NECK: Normal range of motion, supple, no masses noted on observation SKIN: No rash noted. Not diaphoretic. No  erythema. No pallor. MUSCULOSKELETAL: Normal range of motion. No edema noted. NEUROLOGIC: Alert and oriented to person, place, and time. Normal muscle tone coordination.  PSYCHIATRIC: Normal mood and affect. Normal behavior. Normal judgment and thought content. RESPIRATORY: Effort normal, no problems with respiration noted  Labs and Imaging No results found for this or any previous visit (from the past 168 hour(s)). 03/01/2021 Transvaginal Non-OB  Result Date: 02/25/2021 CLINICAL DATA:  Follow-up left ovarian cyst. Family history of ovarian cancer. EXAM: ULTRASOUND PELVIS TRANSVAGINAL TECHNIQUE: Transvaginal ultrasound examination of the pelvis was performed including evaluation of the uterus, ovaries, adnexal regions, and pelvic cul-de-sac. COMPARISON:  Ultrasound 01/08/2021. FINDINGS: Uterus Measurements: 8.7 x 4.8 x 5.0 cm = volume: 107.7 mL. Retroverted uterus. No fibroids or other mass visualized. Endometrium Thickness: 26.9 mm.   no focal abnormality visualized. Right ovary Measurements: 4.7 x 3.3 x 2.8 cm = volume: 22.4 mL. Previously noted follicular cyst appears smaller on today's exam. A 1.4 cm cyst with faint internal echoes noted, most likely small hemorrhagic cyst. Left ovary Measurements: 4.8 x 2.4 x 1.9 cm = volume: 11.4 cm mL. Previously noted large 6.8 cm complex cyst left ovary has resolved. A 2.0 cm simple cyst is noted in the left ovary. Other findings:  Small amount of free pelvic fluid. IMPRESSION: 1. Endometrial thickening to 26.9 mm. Endometrial thickness is considered abnormal. Consider follow-up by 01/10/2021 in 6-8 weeks, during the week immediately following  menses (exam timing is critical). 2. Previously noted follicular cysts in the right ovary appear smaller on today's exam. A 1.4 cm cyst with faint internal echoes noted in the right ovary. This is most likely a small hemorrhagic cyst. This can be followed up with above recommended follow-up ultrasound. 3. Interval resolution of previously  identified 6.8 cm complex cyst in the left ovary. A 2.0 cm simple cyst in the left ovary. 4.  Small amount of free pelvic fluid. Electronically Signed   By: Maisie Fus  Register M.D.   On: 02/25/2021 08:11   US Abdomen Limited RUQ (LIVER/GB)  Result Date: 02/28/2021 CLINICAL DATA:  Right upper quadrant pain, nausea, vomiting for 3 weeks EXAM: ULTRASOUND ABDOMEN LIMITED RIGHT UPPER QUADRANT COMPARISON:  Right upper quadrant ultrasound 10/26/2016 FINDINGS: Gallbladder: No gallstones or wall thickening visualized. No sonographic Murphy sign noted by sonographer. A few gallbladder polyps are noted measuring up to 4 mm, not significantly changed since 2018. Common bile duct: Diameter: 3 mm Liver: No focal lesion identified. Parenchymal echogenicity is mildly increased. Portal vein is patent on color Doppler imaging with normal direction of blood flow towards the liver. Other: None. IMPRESSION: 1. No cholelithiasis or evidence of acute cholecystitis. 2. Gallbladder polyps measuring up to 4 mm, not significantly changed since 2018. 3. Suspect mild fatty infiltration of the liver. Electronically Signed   By: Lesia Hausen M.D.   On: 02/28/2021 11:18      Assessment and Plan:   Problem List Items Addressed This Visit       Other   Adnexal mass    Resolved on most recent scan and CA-125 normal. Other non-significant findings present, reassured patient that thickened endometrium not of concern as she had her period a day later and it was normal on previous US.       Pelvic pain - Primary    Unfortunately has not yet been contacted by IR though referral appears to have been approved. Referral placed again, patient also given the phone number for [facility]'s office to call and try to schedule an appointment. Gabapentin not effective, will trial amitriptyline for time being.      Relevant Medications   amitriptyline (ELAVIL) 10 MG tablet   Other Relevant Orders   Ambulatory referral to Interventional  Radiology    Routine preventative health maintenance measures emphasized. Please refer to After Visit Summary for other counseling recommendations.   Return in about 4 weeks (around 04/08/2021) for f/u pelvic pain.    Total face-to-face time with patient: 20 minutes.  Over 50% of encounter was spent on counseling and coordination of care.   Venora Maples, MD/MPH Attending Family Medicine Physician, [facility] for [facility], [facility]

## 2021-03-12 NOTE — Assessment & Plan Note (Signed)
Unfortunately has not yet been contacted by IR though referral appears to have been approved. Referral placed again, patient also given the phone number for [facility]'s office to call and try to schedule an appointment. Gabapentin not effective, will trial amitriptyline for time being.

## 2021-03-12 NOTE — Assessment & Plan Note (Signed)
Resolved on most recent scan and CA-125 normal. Other non-significant findings present, reassured patient that thickened endometrium not of concern as she had her period a day later and it was normal on previous US.

## 2021-03-20 ENCOUNTER — Other Ambulatory Visit: Payer: 59

## 2021-04-03 ENCOUNTER — Other Ambulatory Visit: Payer: 59

## 2021-04-09 ENCOUNTER — Ambulatory Visit: Payer: 59 | Admitting: Family Medicine

## 2021-09-22 ENCOUNTER — Emergency Department (HOSPITAL_COMMUNITY)
Admission: EM | Admit: 2021-09-22 | Discharge: 2021-09-22 | Payer: Commercial Managed Care - HMO | Attending: Emergency Medicine | Admitting: Emergency Medicine

## 2021-09-22 ENCOUNTER — Encounter (HOSPITAL_COMMUNITY): Payer: Self-pay

## 2021-09-22 ENCOUNTER — Emergency Department (HOSPITAL_COMMUNITY): Payer: Commercial Managed Care - HMO

## 2021-09-22 ENCOUNTER — Other Ambulatory Visit: Payer: Self-pay

## 2021-09-22 DIAGNOSIS — M542 Cervicalgia: Secondary | ICD-10-CM | POA: Insufficient documentation

## 2021-09-22 LAB — POC URINE PREG, ED: Preg Test, Ur: NEGATIVE

## 2021-09-22 MED ORDER — NAPROXEN 500 MG PO TABS
500.0000 mg | ORAL_TABLET | Freq: Once | ORAL | Status: AC
  Administered 2021-09-22: 500 mg via ORAL
  Filled 2021-09-22: qty 1

## 2021-09-22 MED ORDER — ACETAMINOPHEN 325 MG PO TABS
650.0000 mg | ORAL_TABLET | Freq: Once | ORAL | Status: AC
Start: 2021-09-22 — End: 2021-09-22
  Administered 2021-09-22: 650 mg via ORAL
  Filled 2021-09-22: qty 2
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
## 2021-09-22 NOTE — ED Notes (Signed)
Shoulder sling applied.
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
## 2021-10-11 ENCOUNTER — Emergency Department (HOSPITAL_COMMUNITY)
Admission: EM | Admit: 2021-10-11 | Discharge: 2021-10-11 | Disposition: A | Discharge status: Home / Self Care | Payer: Commercial Managed Care - HMO | Attending: Emergency Medicine | Admitting: Emergency Medicine

## 2021-10-11 ENCOUNTER — Other Ambulatory Visit: Payer: Self-pay

## 2021-10-11 ENCOUNTER — Encounter (HOSPITAL_COMMUNITY): Payer: Self-pay

## 2021-10-11 DIAGNOSIS — H5789 Other specified disorders of eye and adnexa: Secondary | ICD-10-CM | POA: Diagnosis present

## 2021-10-11 DIAGNOSIS — H00011 Hordeolum externum right upper eyelid: Secondary | ICD-10-CM | POA: Insufficient documentation

## 2021-10-11 MED ORDER — AMOXICILLIN-POT CLAVULANATE 875-125 MG PO TABS
1.0000 | ORAL_TABLET | Freq: Once | ORAL | Status: AC
Start: 2021-10-11 — End: 2021-10-11
  Administered 2021-10-11: 1 via ORAL
  Filled 2021-10-11: qty 1

## 2021-10-11 MED ORDER — AMOXICILLIN-POT CLAVULANATE 875-125 MG PO TABS: 1.0000 | ORAL_TABLET | Freq: Two times a day (BID) | ORAL | 0 refills | Status: DC

## 2021-10-11 MED ORDER — ERYTHROMYCIN 5 MG/GM OP OINT: TOPICAL_OINTMENT | OPHTHALMIC | 0 refills | Status: DC

## 2021-10-11 MED ORDER — ERYTHROMYCIN 5 MG/GM OP OINT
TOPICAL_OINTMENT | Freq: Once | OPHTHALMIC | Status: AC
  Administered 2021-10-11: 1 via OPHTHALMIC
  Filled 2021-10-11: qty 3.5

## 2021-10-11 NOTE — Discharge Instructions (Signed)
Take the prescribed medication as directed.  I would continue warm compresses at home. Follow-up with your primary care doctor. Return to the ED for new or worsening symptoms.
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
## 2021-10-11 NOTE — ED Provider Notes (Signed)
[facility]-EMERGENCY DEPT Provider Note   CSN: [id number] Arrival date & time: 10/11/21  2025     History  Chief Complaint  Patient presents with   Stye    Victoria Cline is a 31 y.o. female.  The history is provided by the patient and medical records.   31 y.o. F presenting to the ED with stye of right eye for the past 2 weeks.  States she woke up one morning and it was like that.  She denies any injury/trauma to the eye.  No new make-up, lash extensions/false lashes, etc.  She does not wear glasses or corrective lenses.  Denies vision changes.  States she has been doing warm compresses but stye will not go away.  Home Medications Prior to Admission medications   Medication Sig Start Date End Date Taking? Authorizing Provider  amoxicillin-clavulanate (AUGMENTIN) 875-125 MG tablet Take 1 tablet by mouth every 12 (twelve) hours. 10/11/21  Yes Garlon Hatchet, PA-C  erythromycin ophthalmic ointment Place a 1/2 inch ribbon of ointment into the lower eyelid. 10/11/21  Yes Garlon Hatchet, PA-C  IBUPROFEN PO Take 1 tablet by mouth every 6 (six) hours as needed (pain).   Yes [provider]  amitriptyline (ELAVIL) 10 MG tablet Take 1 tablet (10 mg total) by mouth at bedtime. Patient not taking: Reported on 10/11/2021 03/11/21   Venora Maples, MD  omeprazole (PRILOSEC) 20 MG capsule Take 1 capsule (20 mg total) by mouth daily. Patient not taking: Reported on 10/11/2021 02/28/21   Prosperi, Christian H, PA-C  ondansetron (ZOFRAN ODT) 4 MG disintegrating tablet Take 1 tablet (4 mg total) by mouth every 8 (eight) hours as needed for nausea or vomiting. Patient not taking: Reported on 10/11/2021 02/28/21   Prosperi, Christian H, PA-C  sucralfate (CARAFATE) 1 g tablet Take 1 tablet (1 g total) by mouth 4 (four) times daily -  with meals and at bedtime. Patient not taking: Reported on 10/11/2021 02/28/21   Prosperi, Christian H, PA-C      Allergies    Patient has  no known allergies.    Review of Systems   Review of Systems  Eyes:  Positive for pain.  All other systems reviewed and are negative.   Physical Exam Updated Vital Signs BP 110/74 (BP Location: Left Arm)   Pulse 92   Temp 98.6 F (37 C) (Oral)   Resp 16   Ht 5\' 4"  (1.626 m)   Wt 59 kg   SpO2 100%   BMI 22.31 kg/m   Physical Exam Vitals and nursing note reviewed.  Constitutional:      Appearance: She is well-developed.  HENT:     Head: Normocephalic and atraumatic.  Eyes:     Conjunctiva/sclera: Conjunctivae normal.     Pupils: Pupils are equal, round, and reactive to light.      Comments: Conjunctiva normal in appearance without injection or drainage, no hemorrhage, EOMs intact without elicited pain  Cardiovascular:     Rate and Rhythm: Normal rate and regular rhythm.     Heart sounds: Normal heart sounds.  Pulmonary:     Effort: Pulmonary effort is normal.     Breath sounds: Normal breath sounds.  Abdominal:     General: Bowel sounds are normal.     Palpations: Abdomen is soft.  Musculoskeletal:        General: Normal range of motion.     Cervical back: Normal range of motion.  Skin:  General: Skin is warm and dry.  Neurological:     Mental Status: She is alert and oriented to person, place, and time.     ED Results / Procedures / Treatments   Labs (all labs ordered are listed, but only abnormal results are displayed) Labs Reviewed - No data to display  EKG None  Radiology No results found.  Procedures Procedures    Medications Ordered in ED Medications  erythromycin ophthalmic ointment (1 Application Right Eye Given 10/11/21 2254)  amoxicillin-clavulanate (AUGMENTIN) 875-125 MG per tablet 1 tablet (1 tablet Oral Given 10/11/21 2253)    ED Course/ Medical Decision Making/ A&P                           Medical Decision Making Risk Prescription drug management.   31 y.o. F here with stye to right upper lid x2 weeks despite warm compresses.   Afebrile, non-toxic.  Stye noted to outer margin of right upper lid, well circumscribed without diffuse lid edema or erythema.  Stye does have pustule on top without drainage or bleeding.  Conjunctiva normal without injection or hemorrhage.  EOMs intact without pain.  Concern for possible superimposed infection given pustule present as well.  No findings suggestive of pre-orbital or septal cellulitis at this time.  Will start on augmentin and erythromycin ointment.  Advised to follow-up with PCP.  Return here for new concerns.  Final Clinical Impression(s) / ED Diagnoses Final diagnoses:  Hordeolum externum of right upper eyelid    Rx / DC Orders ED Discharge Orders          Ordered    erythromycin ophthalmic ointment        10/11/21 2306    amoxicillin-clavulanate (AUGMENTIN) 875-125 MG tablet  Every 12 hours        10/11/21 2306              Garlon Hatchet, PA-C 10/11/21 2311    Jacalyn Lefevre, MD 10/11/21 2346

## 2021-12-05 ENCOUNTER — Ambulatory Visit (HOSPITAL_COMMUNITY): Payer: Self-pay

## 2021-12-07 ENCOUNTER — Emergency Department (HOSPITAL_BASED_OUTPATIENT_CLINIC_OR_DEPARTMENT_OTHER)
Admission: EM | Admit: 2021-12-07 | Discharge: 2021-12-07 | Disposition: A | Discharge status: Home / Self Care | Payer: Commercial Managed Care - HMO | Attending: Emergency Medicine | Admitting: Emergency Medicine

## 2021-12-07 ENCOUNTER — Other Ambulatory Visit: Payer: Self-pay

## 2021-12-07 ENCOUNTER — Encounter (HOSPITAL_BASED_OUTPATIENT_CLINIC_OR_DEPARTMENT_OTHER): Payer: Self-pay | Admitting: Emergency Medicine

## 2021-12-07 DIAGNOSIS — N898 Other specified noninflammatory disorders of vagina: Secondary | ICD-10-CM | POA: Insufficient documentation

## 2021-12-07 DIAGNOSIS — N3000 Acute cystitis without hematuria: Secondary | ICD-10-CM | POA: Diagnosis not present

## 2021-12-07 DIAGNOSIS — Z79899 Other long term (current) drug therapy: Secondary | ICD-10-CM | POA: Diagnosis not present

## 2021-12-07 DIAGNOSIS — R350 Frequency of micturition: Secondary | ICD-10-CM | POA: Diagnosis present

## 2021-12-07 LAB — WET PREP, GENITAL
Sperm: NONE SEEN
Trich, Wet Prep: NONE SEEN
WBC, Wet Prep HPF POC: <10 (ref <10)
Yeast Wet Prep HPF POC: NONE SEEN

## 2021-12-07 LAB — URINALYSIS, ROUTINE W REFLEX MICROSCOPIC
Bilirubin Urine: NEGATIVE
Glucose, UA: NEGATIVE mg/dL
Ketones, ur: NEGATIVE mg/dL
Nitrite: NEGATIVE
Specific Gravity, Urine: 1.017 (ref 1.005–1.030)
WBC, UA: >50 WBC/hpf — ABNORMAL HIGH (ref 0–5)
pH: 6 (ref 5.0–8.0)

## 2021-12-07 LAB — PREGNANCY, URINE: Preg Test, Ur: NEGATIVE

## 2021-12-07 MED ORDER — CEPHALEXIN 500 MG PO CAPS: 500.0000 mg | ORAL_CAPSULE | Freq: Three times a day (TID) | ORAL | 0 refills | Status: AC

## 2021-12-07 MED ORDER — OXYCODONE-ACETAMINOPHEN 5-325 MG PO TABS
1.0000 | ORAL_TABLET | Freq: Once | ORAL | Status: AC
  Administered 2021-12-07: 1 via ORAL
  Filled 2021-12-07: qty 1

## 2021-12-07 MED ORDER — LIDOCAINE HCL (PF) 1 % IJ SOLN
1.0000 mL | Freq: Once | INTRAMUSCULAR | Status: AC
  Administered 2021-12-07: 1 mL
  Filled 2021-12-07: qty 5

## 2021-12-07 MED ORDER — IBUPROFEN 600 MG PO TABS: 600.0000 mg | ORAL_TABLET | Freq: Three times a day (TID) | ORAL | 0 refills | Status: DC | PRN

## 2021-12-07 MED ORDER — STERILE WATER FOR INJECTION IJ SOLN
INTRAMUSCULAR | Status: AC
  Filled 2021-12-07: qty 10

## 2021-12-07 MED ORDER — LIDOCAINE 1% INJECTION FOR CIRCUMCISION: 2.0000 mL | INJECTION | Freq: Once | INTRAVENOUS | Status: DC

## 2021-12-07 MED ORDER — CEFTRIAXONE SODIUM 500 MG IJ SOLR
500.0000 mg | Freq: Once | INTRAMUSCULAR | Status: AC
  Administered 2021-12-07: 500 mg via INTRAMUSCULAR
  Filled 2021-12-07: qty 500

## 2021-12-07 MED ORDER — ACETAMINOPHEN 325 MG PO TABS: 650.0000 mg | ORAL_TABLET | Freq: Four times a day (QID) | ORAL | 0 refills | Status: DC | PRN

## 2021-12-07 NOTE — ED Triage Notes (Signed)
Painful urination for 2 days and back/pelvic pain. Denies fever

## 2021-12-07 NOTE — ED Notes (Signed)
Discharge instructions, follow up care, and prescriptions reviewed and explained, pt verbalized understanding. Pt caox4 and ambulatory on d/c.  

## 2021-12-07 NOTE — Discharge Instructions (Signed)
You are diagnosed with a possible urine infection today which may include a kidney infection.  You were given antibiotics in the ER and started on 9 more days of an antibiotic called Keflex.  Your urine culture will take 2 to 3 days to result to determine whether there is truly an infection in the urine.  We also do pelvic exam which showed possible signs of an infection with discharge in the vagina.  This could include sexual disease.  You were given intramuscular antibiotics in the ED.  Your STD testing results will take several days, 3 to 5 days on average, before final results are available.  Log onto your patient portal daily to follow-up in your results.  If you test positive for chlamydia or gonorrhea you need to notify all of your sexual partners dating back to your last test, so they can also be tested and treated.  You should avoid any sexual contact for the next 2 weeks until you have confirmed negative results and have completed your antibiotics.

## 2021-12-07 NOTE — ED Provider Notes (Signed)
[facility] EMERGENCY DEPT Provider Note   CSN: [id number] Arrival date & time: 12/07/21  1610     History {Add pertinent medical, surgical, social history, OB history to HPI:1} Chief Complaint  Patient presents with   Urinary Frequency    Victoria Cline is a 31 y.o. female presenting emergency department with pelvic pain and urinary frequency and pressure for the past 3 days.  She reports history of UTIs in the past.  She is not having pain in her lower back as well.  She reports some mild vaginal discharge.  She has had unprotected sex with a female partner.  She also reports a history of chlamydia or gonorrhea in the past but says it was a long time ago".  HPI     Home Medications Prior to Admission medications   Medication Sig Start Date End Date Taking? Authorizing Provider  amitriptyline (ELAVIL) 10 MG tablet Take 1 tablet (10 mg total) by mouth at bedtime. Patient not taking: Reported on 10/11/2021 03/11/21   Venora Maples, MD  amoxicillin-clavulanate (AUGMENTIN) 875-125 MG tablet Take 1 tablet by mouth every 12 (twelve) hours. 10/11/21   Garlon Hatchet, PA-C  erythromycin ophthalmic ointment Place a 1/2 inch ribbon of ointment into the lower eyelid. 10/11/21   Garlon Hatchet, PA-C  IBUPROFEN PO Take 1 tablet by mouth every 6 (six) hours as needed (pain).    [provider]  omeprazole (PRILOSEC) 20 MG capsule Take 1 capsule (20 mg total) by mouth daily. Patient not taking: Reported on 10/11/2021 02/28/21   Prosperi, Christian H, PA-C  ondansetron (ZOFRAN ODT) 4 MG disintegrating tablet Take 1 tablet (4 mg total) by mouth every 8 (eight) hours as needed for nausea or vomiting. Patient not taking: Reported on 10/11/2021 02/28/21   Prosperi, Christian H, PA-C  sucralfate (CARAFATE) 1 g tablet Take 1 tablet (1 g total) by mouth 4 (four) times daily -  with meals and at bedtime. Patient not taking: Reported on 10/11/2021 02/28/21   Prosperi, Christian H,  PA-C      Allergies    Patient has no known allergies.    Review of Systems   Review of Systems  Physical Exam Updated Vital Signs BP 100/60   Pulse 67   Resp 16   SpO2 100%  Physical Exam Constitutional:      General: She is not in acute distress. HENT:     Head: Normocephalic and atraumatic.  Eyes:     Conjunctiva/sclera: Conjunctivae normal.     Pupils: Pupils are equal, round, and reactive to light.  Cardiovascular:     Rate and Rhythm: Normal rate and regular rhythm.  Pulmonary:     Effort: Pulmonary effort is normal. No respiratory distress.  Abdominal:     General: There is no distension.     Tenderness: There is no abdominal tenderness.  Skin:    General: Skin is warm and dry.  Neurological:     General: No focal deficit present.     Mental Status: She is alert. Mental status is at baseline.  Psychiatric:        Mood and Affect: Mood normal.        Behavior: Behavior normal.     ED Results / Procedures / Treatments   Labs (all labs ordered are listed, but only abnormal results are displayed) Labs Reviewed  URINALYSIS, ROUTINE W REFLEX MICROSCOPIC - Abnormal; Notable for the following components:      Result Value  APPearance HAZY (*)    Hgb urine dipstick TRACE (*)    Protein, ur TRACE (*)    Leukocytes,Ua LARGE (*)    WBC, UA >50 (*)    Bacteria, UA MANY (*)    Non Squamous Epithelial 0-5 (*)    All other components within normal limits  PREGNANCY, URINE    EKG None  Radiology No results found.  Procedures Procedures  {Document cardiac monitor, telemetry assessment procedure when appropriate:1}  Medications Ordered in ED Medications - No data to display  ED Course/ Medical Decision Making/ A&P                           Medical Decision Making Amount and/or Complexity of Data Reviewed Labs: ordered.   ***  {Document critical care time when appropriate:1} {Document review of labs and clinical decision tools ie heart score,  Chads2Vasc2 etc:1}  {Document your independent review of radiology images, and any outside records:1} {Document your discussion with family members, caretakers, and with consultants:1} {Document social determinants of health affecting pt's care:1} {Document your decision making why or why not admission, treatments were needed:1} Final Clinical Impression(s) / ED Diagnoses Final diagnoses:  None    Rx / DC Orders ED Discharge Orders     None

## 2021-12-08 ENCOUNTER — Ambulatory Visit: Payer: Commercial Managed Care - HMO

## 2021-12-09 LAB — GC/CHLAMYDIA PROBE AMP (~~LOC~~) NOT AT ARMC
Chlamydia: NEGATIVE
Comment: NEGATIVE
Comment: NORMAL
Neisseria Gonorrhea: NEGATIVE

## 2021-12-10 LAB — URINE CULTURE: Culture: >=100000 — AB

## 2021-12-11 ENCOUNTER — Telehealth: Payer: Self-pay | Admitting: *Deleted

## 2021-12-11 NOTE — Telephone Encounter (Signed)
Post ED Visit - Positive Culture Follow-up  Culture report reviewed by antimicrobial stewardship pharmacist: [facility] []  , Pharm.D. []  Enzo Bi, Pharm.D., BCPS AQ-ID []  , Pharm.D., BCPS []  Celedonio Miyamoto, .D., BCPS []  Gonzalez, .D., BCPS, AAHIVP []  Georgina Pillion, Pharm.D., BCPS, AAHIVP []  [address], PharmD, BCPS []  , PharmD, BCPS []  [place], PharmD, BCPS []  [address], PharmD []  , PharmD, BCPS []  Estella Husk, PharmD  Pharmacy Team []  Lysle Pearl, PharmD []  , PharmD []  Phillips Climes, PharmD []  , Rph []  Agapito Games) , PharmD []  Verlan Friends, PharmD []  , PharmD []  Mervyn Gay, PharmD []  , PharmD []  Vinnie Level, PharmD []  [facility], PharmD []  , PharmD []  Len Childs, PharmD   Positive urine culture Treated with Cephalexin, organism sensitive to the same and no further patient follow-up is required at this time. , PharmD  Greer Pickerel Talley 12/11/2021, 10:06 AM

## 2022-02-14 ENCOUNTER — Ambulatory Visit
Admission: RE | Admit: 2022-02-14 | Discharge: 2022-02-14 | Disposition: A | Discharge status: Home / Self Care | Payer: Self-pay | Source: Ambulatory Visit | Attending: Internal Medicine | Admitting: Internal Medicine

## 2022-02-14 VITALS — BP 117/75 | HR 87 | Temp 98.0°F | Resp 16

## 2022-02-14 DIAGNOSIS — N898 Other specified noninflammatory disorders of vagina: Secondary | ICD-10-CM | POA: Insufficient documentation

## 2022-02-14 DIAGNOSIS — N3001 Acute cystitis with hematuria: Secondary | ICD-10-CM | POA: Insufficient documentation

## 2022-02-14 DIAGNOSIS — Z3202 Encounter for pregnancy test, result negative: Secondary | ICD-10-CM | POA: Insufficient documentation

## 2022-02-14 DIAGNOSIS — Z113 Encounter for screening for infections with a predominantly sexual mode of transmission: Secondary | ICD-10-CM | POA: Insufficient documentation

## 2022-02-14 DIAGNOSIS — R300 Dysuria: Secondary | ICD-10-CM | POA: Insufficient documentation

## 2022-02-14 DIAGNOSIS — R350 Frequency of micturition: Secondary | ICD-10-CM | POA: Insufficient documentation

## 2022-02-14 LAB — POCT URINALYSIS DIP (MANUAL ENTRY)
Bilirubin, UA: NEGATIVE
Glucose, UA: NEGATIVE mg/dL
Ketones, POC UA: NEGATIVE mg/dL
Nitrite, UA: POSITIVE — AB
Protein Ur, POC: 100 mg/dL — AB
Spec Grav, UA: 1.025 (ref 1.010–1.025)
Urobilinogen, UA: 0.2 E.U./dL
pH, UA: 6.5 (ref 5.0–8.0)

## 2022-02-14 LAB — POCT URINE PREGNANCY: Preg Test, Ur: NEGATIVE

## 2022-02-14 MED ORDER — IBUPROFEN 600 MG PO TABS: 600.0000 mg | ORAL_TABLET | Freq: Four times a day (QID) | ORAL | 0 refills | Status: DC | PRN

## 2022-02-14 MED ORDER — CEPHALEXIN 250 MG PO CAPS: 250.0000 mg | ORAL_CAPSULE | Freq: Four times a day (QID) | ORAL | 0 refills | Status: AC

## 2022-02-14 NOTE — ED Triage Notes (Signed)
Pt is present today with c/o vaginal discharge x2 days
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
## 2022-02-14 NOTE — Discharge Instructions (Addendum)
I have prescribed you an antibiotic and ibuprofen.  Please do not take any additional ibuprofen, Advil, Aleve while taking this ibuprofen.  Your vaginal swab and urine culture pending.  We will call if it is positive.  Refrain from sexual activity until test results and treatment are complete.  Please follow up if symptoms persist or worsen.

## 2022-02-16 LAB — CERVICOVAGINAL ANCILLARY ONLY
Bacterial Vaginitis (gardnerella): POSITIVE — AB
Candida Glabrata: NEGATIVE
Candida Vaginitis: NEGATIVE
Chlamydia: NEGATIVE
Comment: NEGATIVE
Comment: NEGATIVE
Comment: NEGATIVE
Comment: NEGATIVE
Comment: NEGATIVE
Comment: NORMAL
Neisseria Gonorrhea: NEGATIVE
Trichomonas: NEGATIVE

## 2022-02-16 LAB — URINE CULTURE: Culture: >=100000 — AB

## 2022-02-17 ENCOUNTER — Telehealth (HOSPITAL_COMMUNITY): Payer: Self-pay | Admitting: Emergency Medicine

## 2022-02-17 MED ORDER — METRONIDAZOLE 500 MG PO TABS: 500.0000 mg | ORAL_TABLET | Freq: Two times a day (BID) | ORAL | 0 refills | Status: DC

## 2022-03-10 ENCOUNTER — Encounter (HOSPITAL_COMMUNITY): Payer: Self-pay | Admitting: Emergency Medicine

## 2022-03-10 ENCOUNTER — Emergency Department (HOSPITAL_COMMUNITY)
Admission: EM | Admit: 2022-03-10 | Discharge: 2022-03-10 | Discharge status: Against Medical Advice | Payer: Self-pay | Attending: Emergency Medicine | Admitting: Emergency Medicine

## 2022-03-10 DIAGNOSIS — Z5321 Procedure and treatment not carried out due to patient leaving prior to being seen by health care provider: Secondary | ICD-10-CM | POA: Insufficient documentation

## 2022-03-10 DIAGNOSIS — R42 Dizziness and giddiness: Secondary | ICD-10-CM | POA: Insufficient documentation

## 2022-03-10 NOTE — ED Notes (Signed)
Pt seems confused, does not recall why she is at the hospital. Does not know where she is.
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
## 2022-03-10 NOTE — ED Notes (Signed)
Pt refusing to have labs and wishes to leave. Pt is alert and calling a ride to leave. Pt walking to lobby
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
## 2022-03-26 IMAGING — US US ART/VEN ABD/PELV/SCROTUM DOPPLER LTD
1 series · 15 of 25 positions shown · non-contrast
Comparison: None.

CLINICAL DATA: Pelvic pain.  Vaginal bleeding.

EXAM:
TRANSABDOMINAL AND TRANSVAGINAL ULTRASOUND OF PELVIS
DOPPLER ULTRASOUND OF OVARIES
TECHNIQUE: Both transabdominal and transvaginal ultrasound examinations of the
pelvis were performed. Transabdominal technique was performed for
global imaging of the pelvis including uterus, ovaries, adnexal
regions, and pelvic cul-de-sac.
It was necessary to proceed with endovaginal exam following the
transabdominal exam to visualize the adnexa. Color and duplex
Doppler ultrasound was utilized to evaluate blood flow to the
ovaries.

[Series 1: us pelvis complete mc & wl · 15 of 100 slices shown]
[im 1/100]
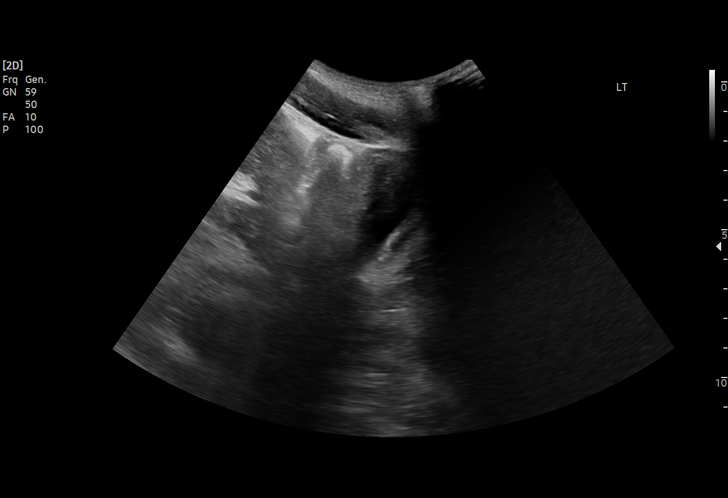
[im 9/100]
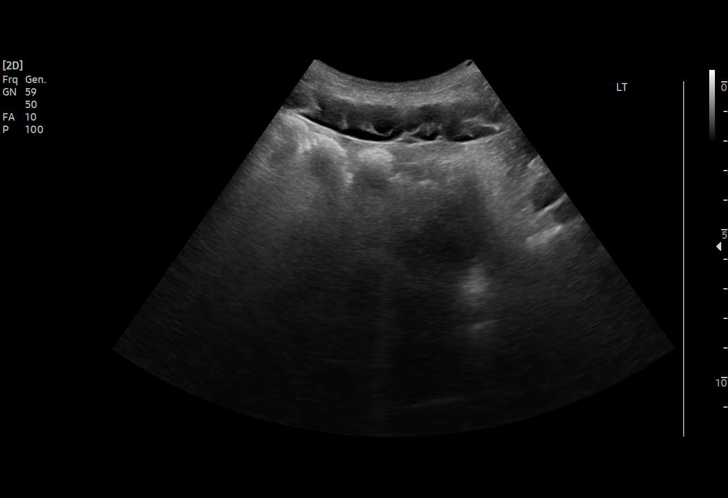
[im 17/100]
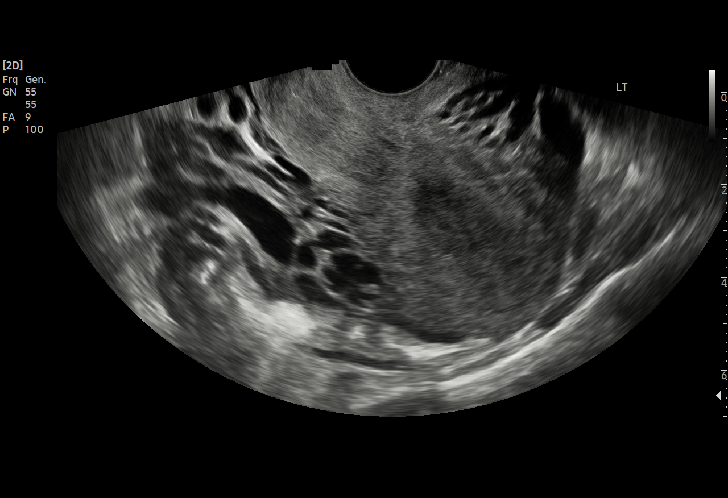
[im 21/100]
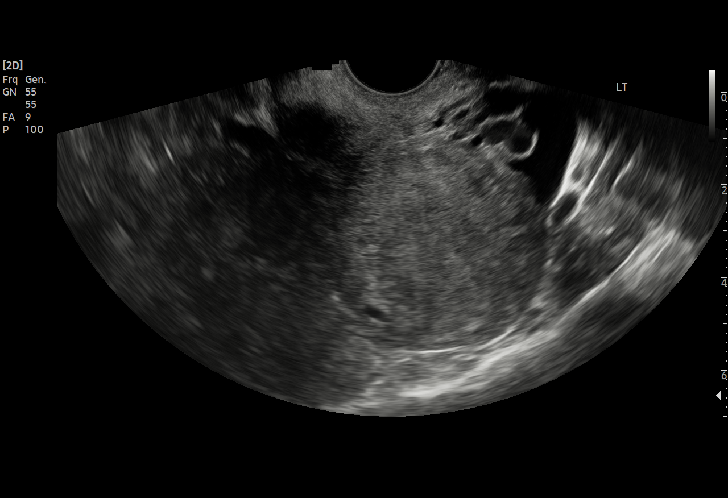
[im 29/100]
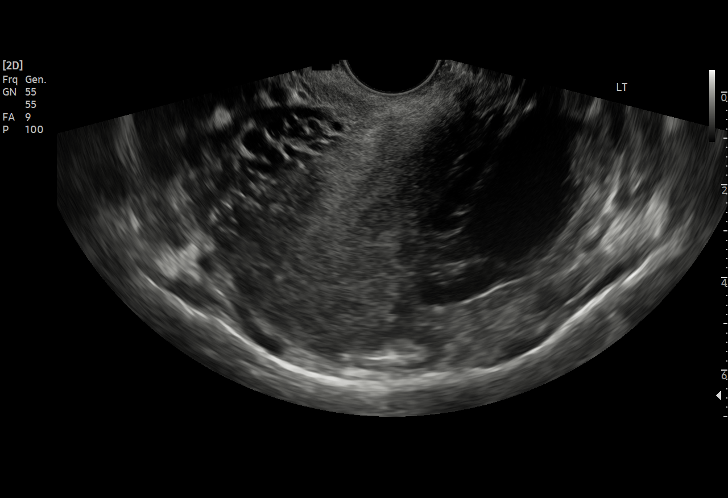
[im 38/100]
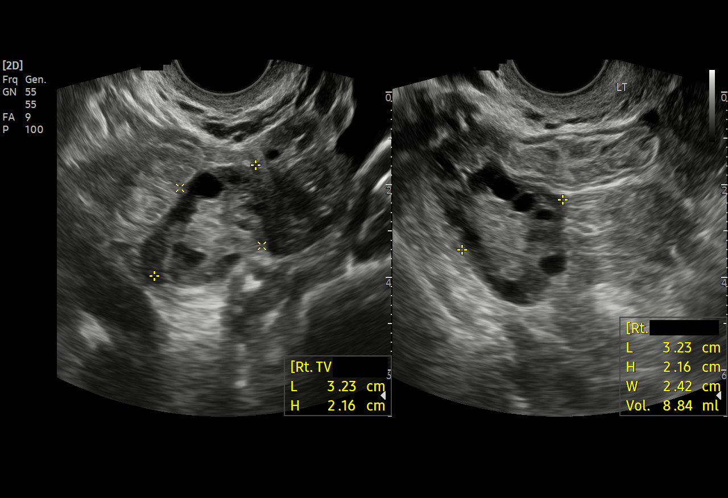
[im 42/100]
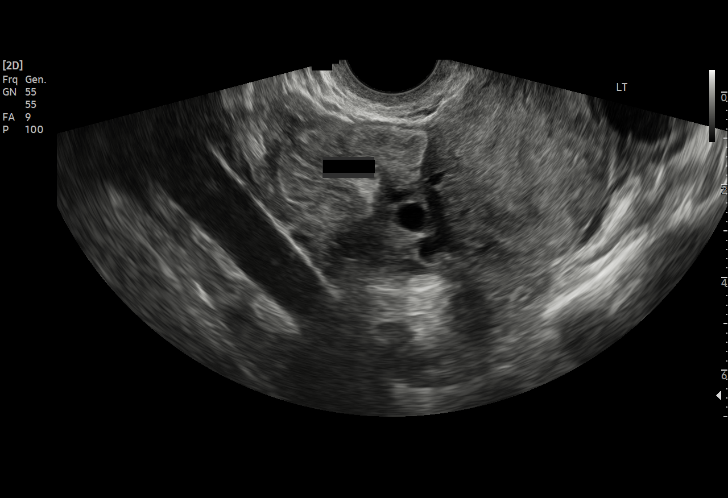
[im 50/100]
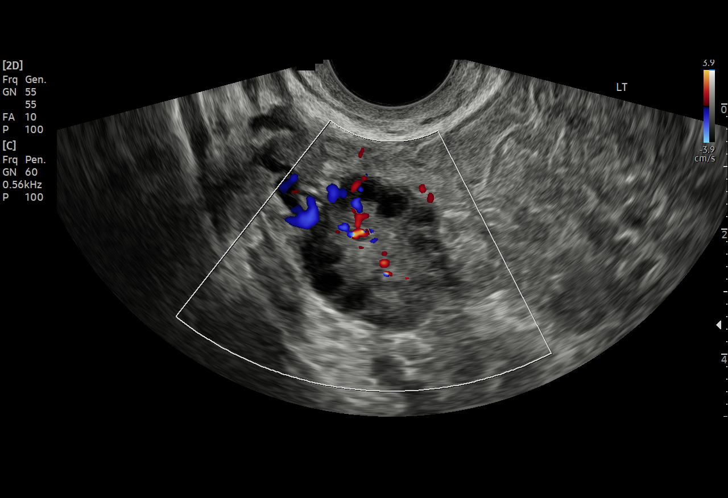
[im 58/100]
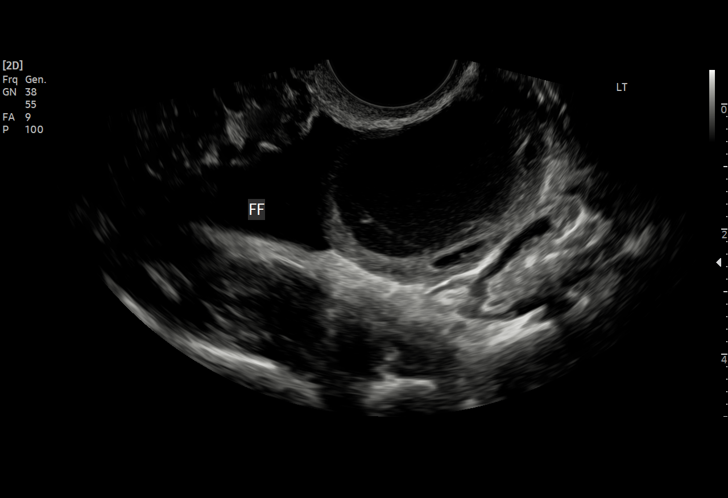
[im 62/100]
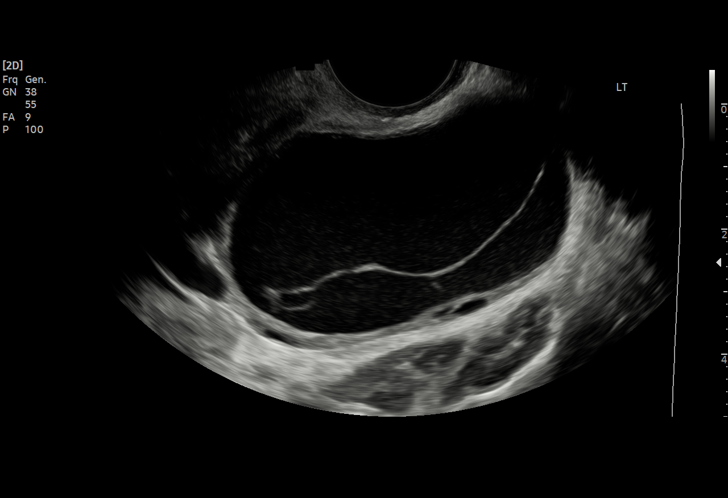
[im 71/100]
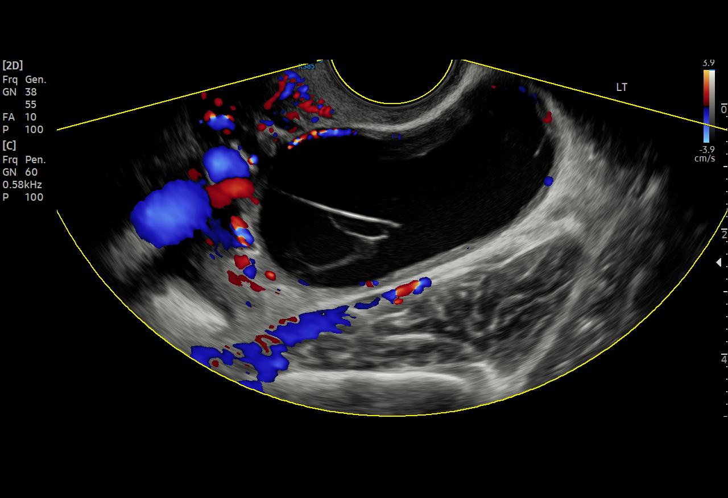
[im 79/100]
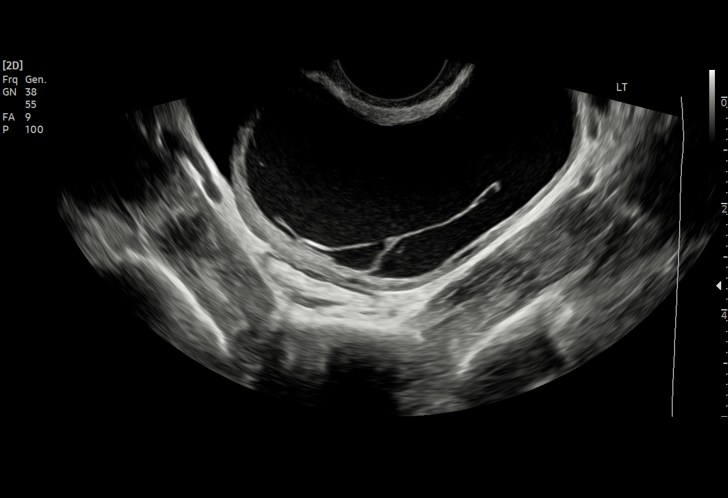
[im 83/100]
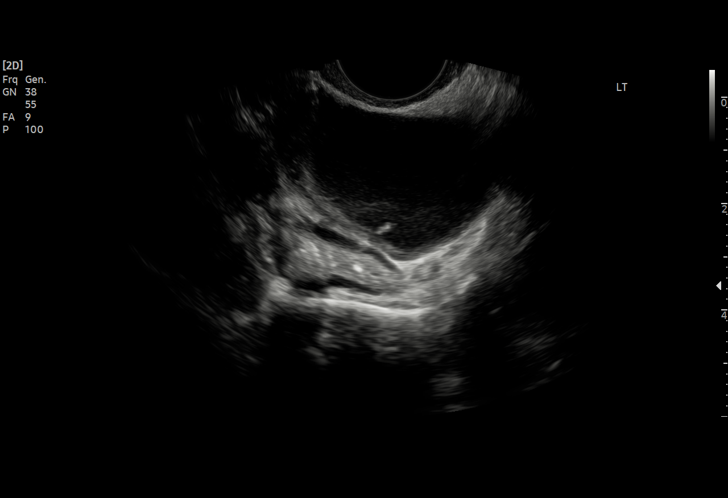
[im 91/100]
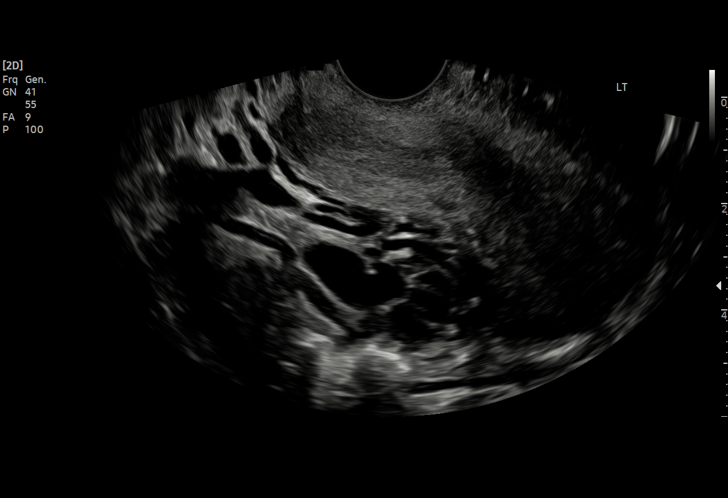
[im 100/100]
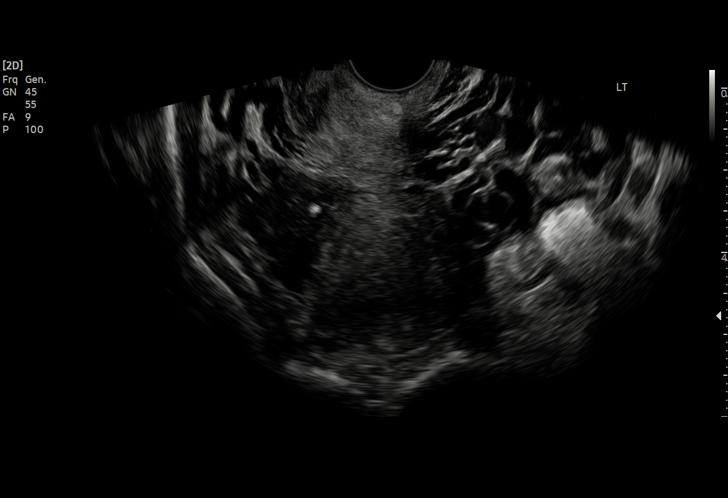

[15 of 25 positions shown; findings below may reference images not displayed]

FINDINGS: Uterus

Measurements: 7.4 x 3.8 x 4.0 cm = volume: 59 mL. No fibroids or
other mass visualized.

Endometrium

Thickness: 9.5 mm.  No focal abnormality visualized.

Right ovary

Measurements: 3.2 x 2.2 x 2.4 cm = volume: 8.8 mL. Normal
appearance/no adnexal mass. Follicles are distributed peripherally.

Left ovary

Measurements: 6.2 x 4.2 x 7.2 cm = volume: 98.6 mL. A septated
irregular walled cyst measures 5.8 x 3.8 x 6.8 cm.

Pulsed Doppler evaluation of both ovaries demonstrates normal
low-resistance arterial and venous waveforms.

Other findings

Moderate amount of free fluid is present. Prominent uterine vessels
noted bilaterally, left greater than right
IMPRESSION: 1. Enlarged septated irregular cyst of the left adnexa measures
x 3.8 x 6.8 cm recommend follow-up US in 3-6 months. Note: This
recommendation does not apply to premenarchal patients or to those
with increased risk (genetic, family history, elevated tumor markers
or other high-risk factors) of ovarian cancer. Reference: Radiology
[DATE]):[id number].
2. Normal color Doppler flow, arterial and venous waveforms without
evidence for ovarian torsion.
3. Normal sonographic appearance of the uterus.

## 2022-03-26 IMAGING — US US TRANSVAGINAL NON-OB
1 series · 15 of 25 positions shown · non-contrast
Comparison: None.

CLINICAL DATA: Pelvic pain.  Vaginal bleeding.

EXAM:
TRANSABDOMINAL AND TRANSVAGINAL ULTRASOUND OF PELVIS
DOPPLER ULTRASOUND OF OVARIES
TECHNIQUE: Both transabdominal and transvaginal ultrasound examinations of the
pelvis were performed. Transabdominal technique was performed for
global imaging of the pelvis including uterus, ovaries, adnexal
regions, and pelvic cul-de-sac.
It was necessary to proceed with endovaginal exam following the
transabdominal exam to visualize the adnexa. Color and duplex
Doppler ultrasound was utilized to evaluate blood flow to the
ovaries.

[Series 1: us pelvis complete mc & wl · 15 of 100 slices shown]
[im 1/100]
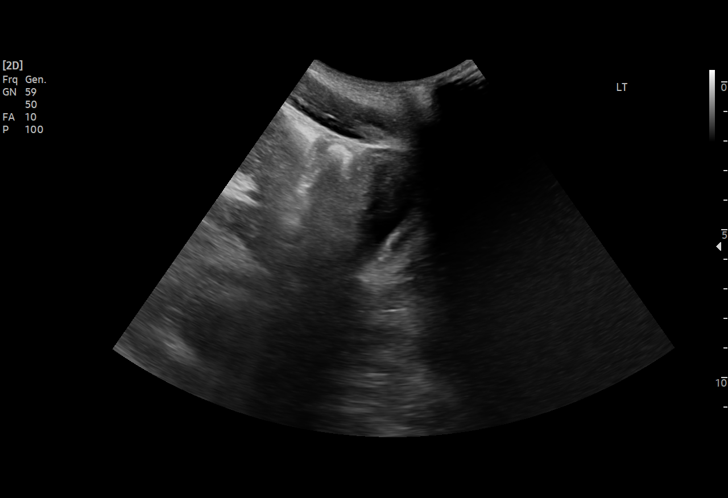
[im 9/100]
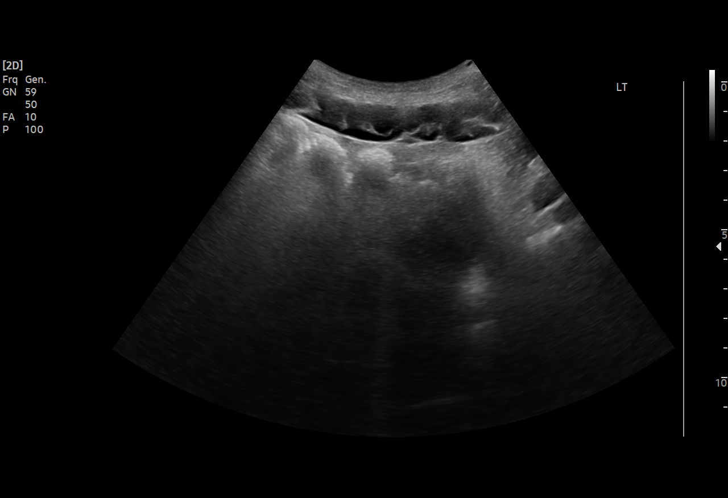
[im 17/100]
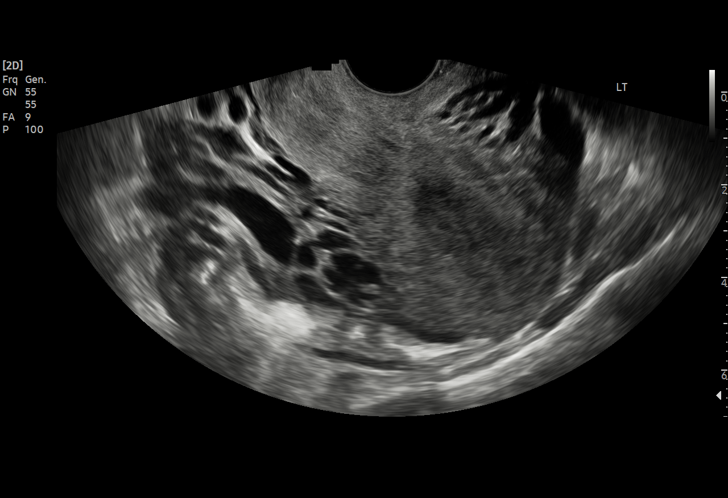
[im 21/100]
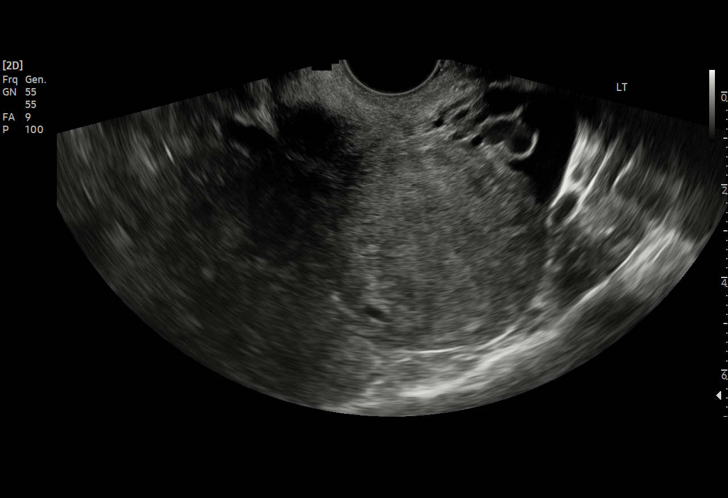
[im 29/100]
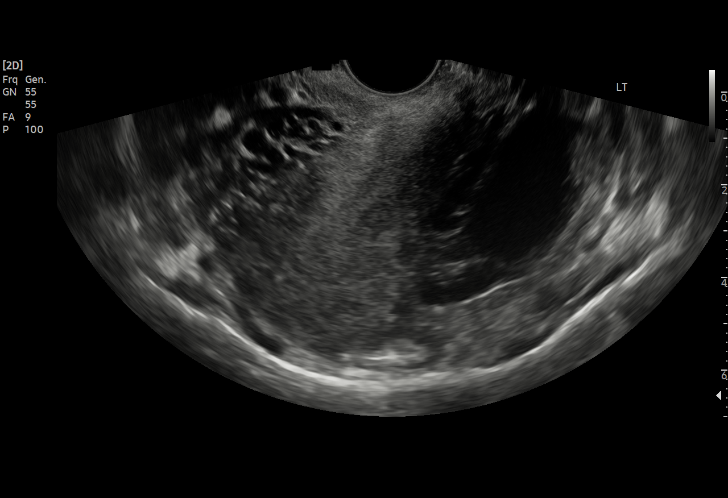
[im 38/100]
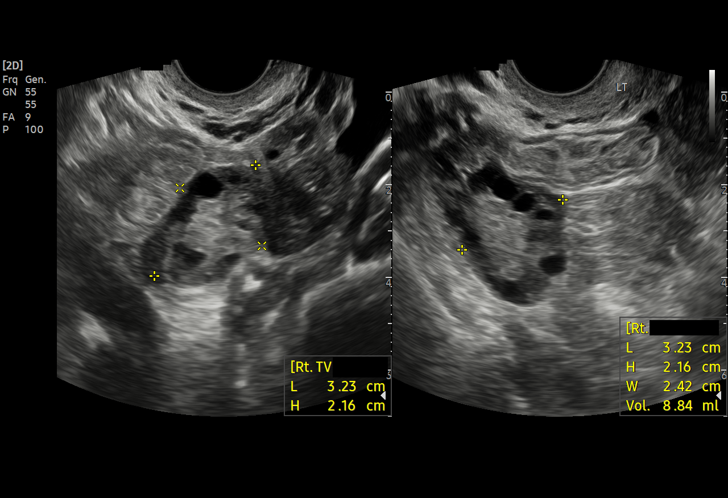
[im 42/100]
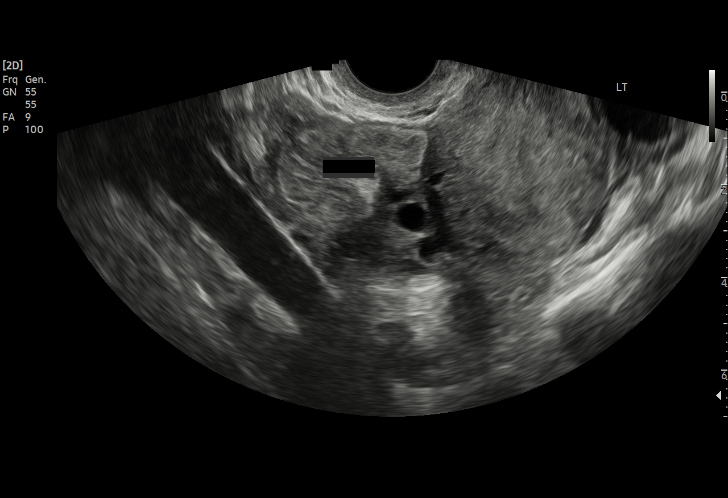
[im 50/100]
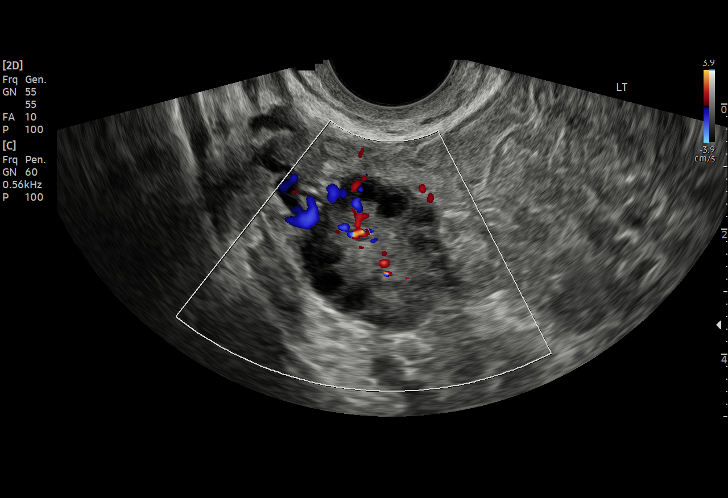
[im 58/100]
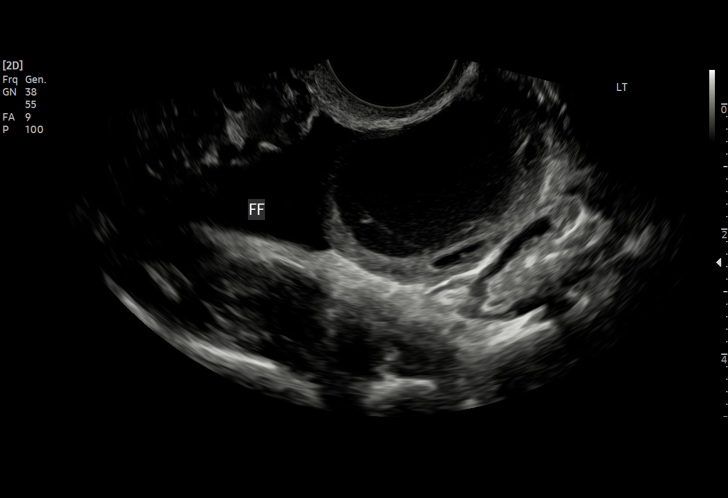
[im 62/100]
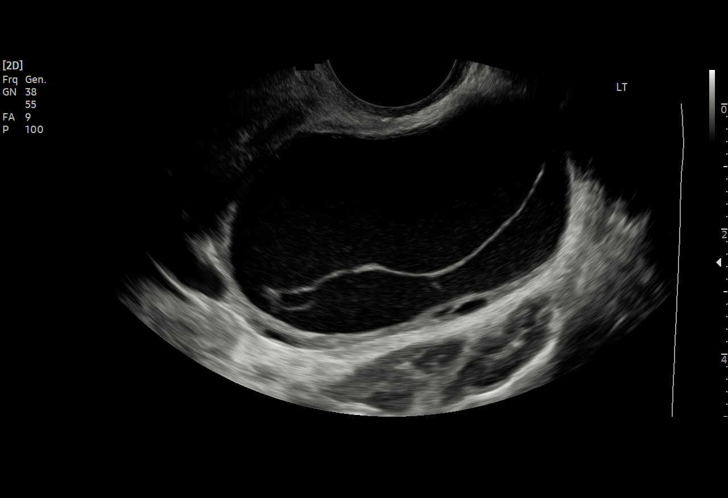
[im 71/100]
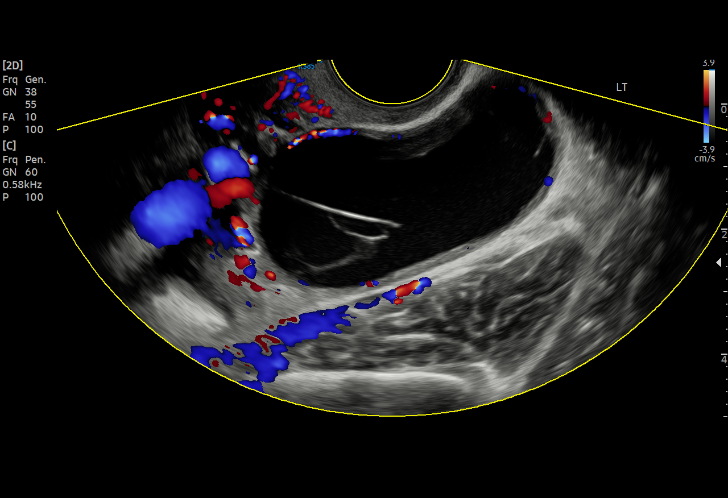
[im 79/100]
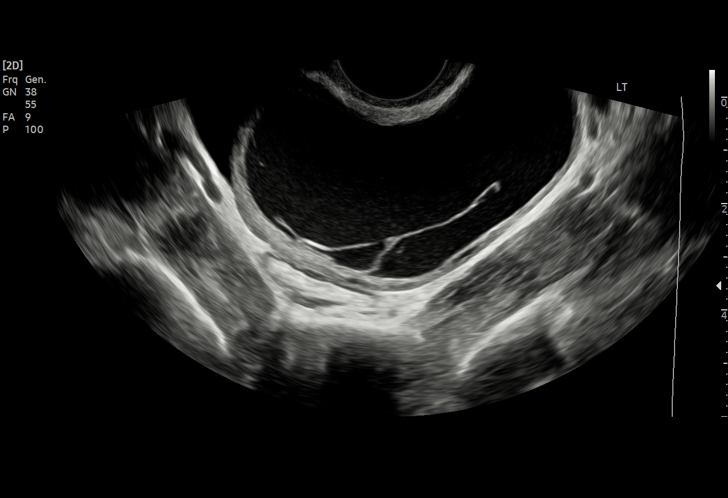
[im 83/100]
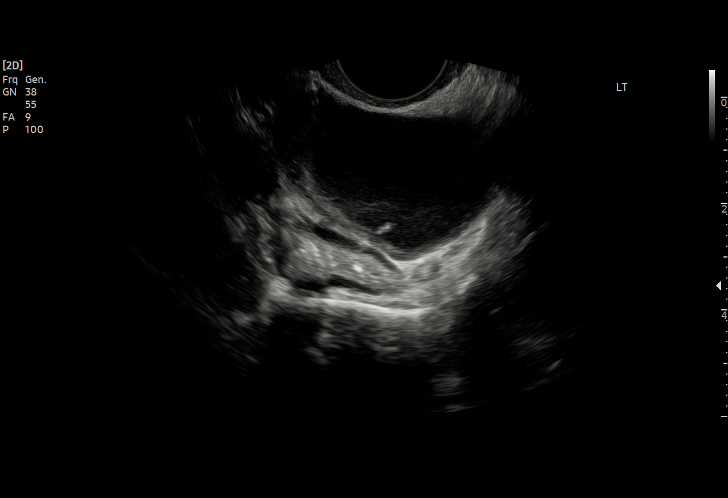
[im 91/100]
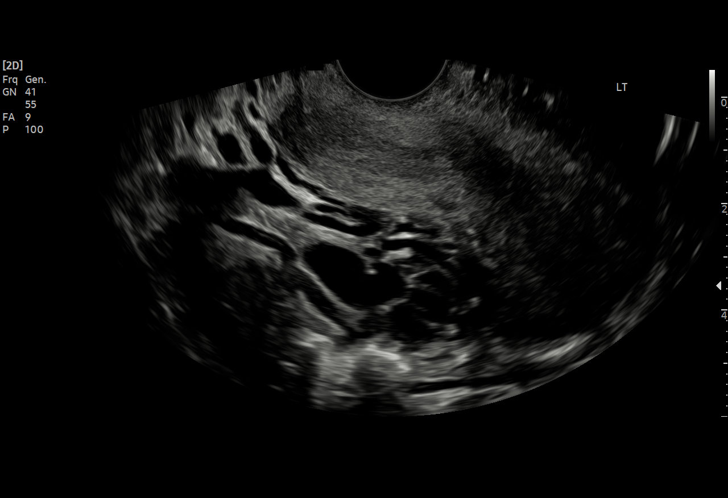
[im 100/100]
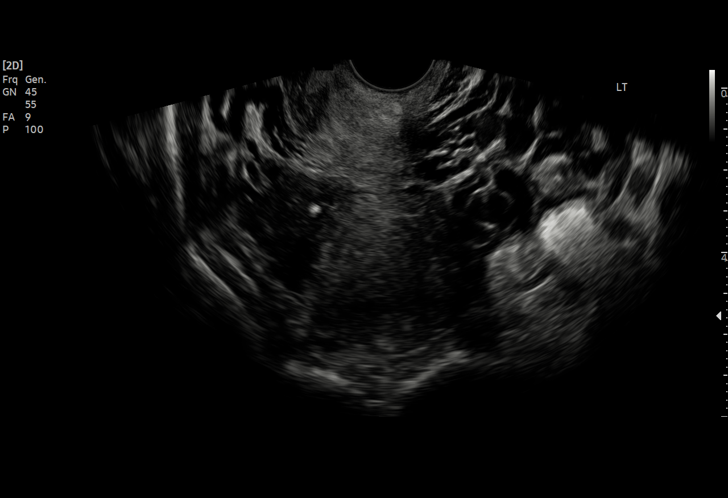

[15 of 25 positions shown; findings below may reference images not displayed]

FINDINGS: Uterus

Measurements: 7.4 x 3.8 x 4.0 cm = volume: 59 mL. No fibroids or
other mass visualized.

Endometrium

Thickness: 9.5 mm.  No focal abnormality visualized.

Right ovary

Measurements: 3.2 x 2.2 x 2.4 cm = volume: 8.8 mL. Normal
appearance/no adnexal mass. Follicles are distributed peripherally.

Left ovary

Measurements: 6.2 x 4.2 x 7.2 cm = volume: 98.6 mL. A septated
irregular walled cyst measures 5.8 x 3.8 x 6.8 cm.

Pulsed Doppler evaluation of both ovaries demonstrates normal
low-resistance arterial and venous waveforms.

Other findings

Moderate amount of free fluid is present. Prominent uterine vessels
noted bilaterally, left greater than right
IMPRESSION: 1. Enlarged septated irregular cyst of the left adnexa measures
x 3.8 x 6.8 cm recommend follow-up US in 3-6 months. Note: This
recommendation does not apply to premenarchal patients or to those
with increased risk (genetic, family history, elevated tumor markers
or other high-risk factors) of ovarian cancer. Reference: Radiology
[DATE]):[id number].
2. Normal color Doppler flow, arterial and venous waveforms without
evidence for ovarian torsion.
3. Normal sonographic appearance of the uterus.

## 2022-03-26 IMAGING — US US PELVIS COMPLETE
1 series · 15 of 25 positions shown · non-contrast
Comparison: None.

CLINICAL DATA: Pelvic pain.  Vaginal bleeding.

EXAM:
TRANSABDOMINAL AND TRANSVAGINAL ULTRASOUND OF PELVIS
DOPPLER ULTRASOUND OF OVARIES
TECHNIQUE: Both transabdominal and transvaginal ultrasound examinations of the
pelvis were performed. Transabdominal technique was performed for
global imaging of the pelvis including uterus, ovaries, adnexal
regions, and pelvic cul-de-sac.
It was necessary to proceed with endovaginal exam following the
transabdominal exam to visualize the adnexa. Color and duplex
Doppler ultrasound was utilized to evaluate blood flow to the
ovaries.

[Series 1: us pelvis complete mc & wl · 15 of 100 slices shown]
[im 1/100]
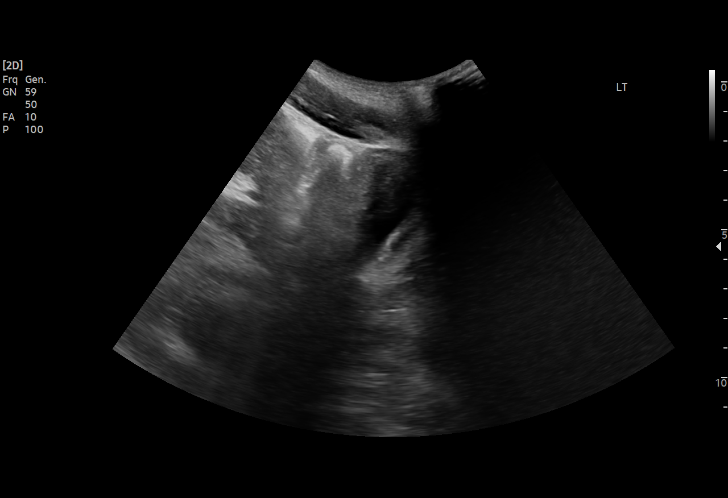
[im 9/100]
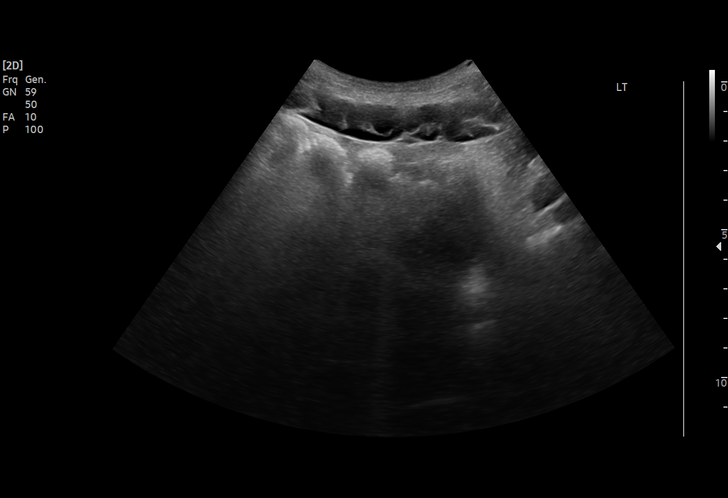
[im 17/100]
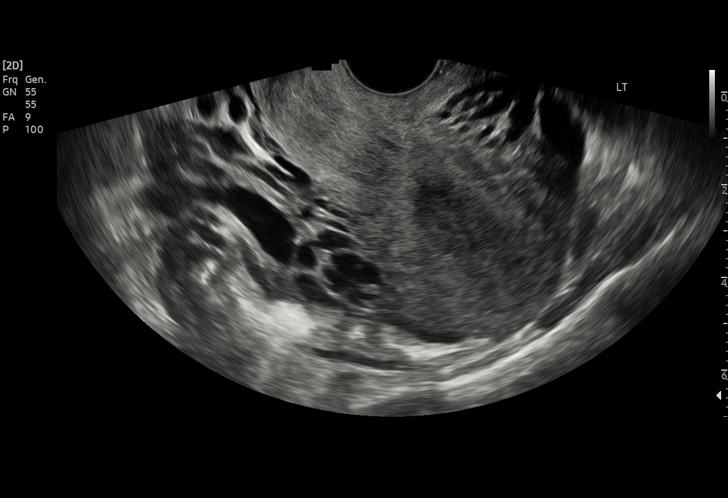
[im 21/100]
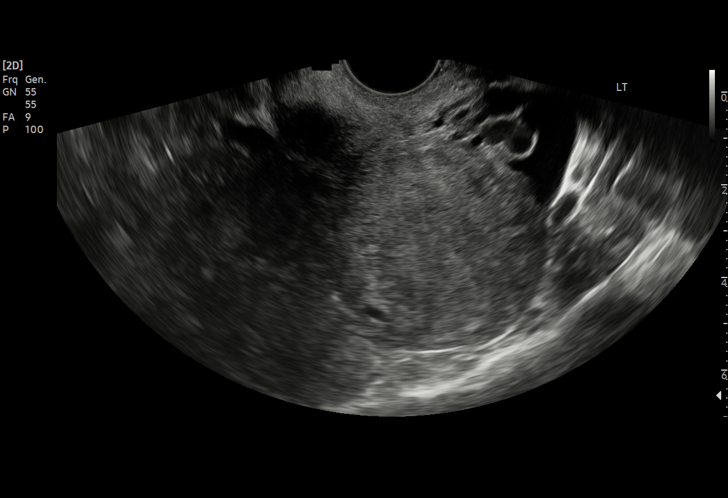
[im 29/100]
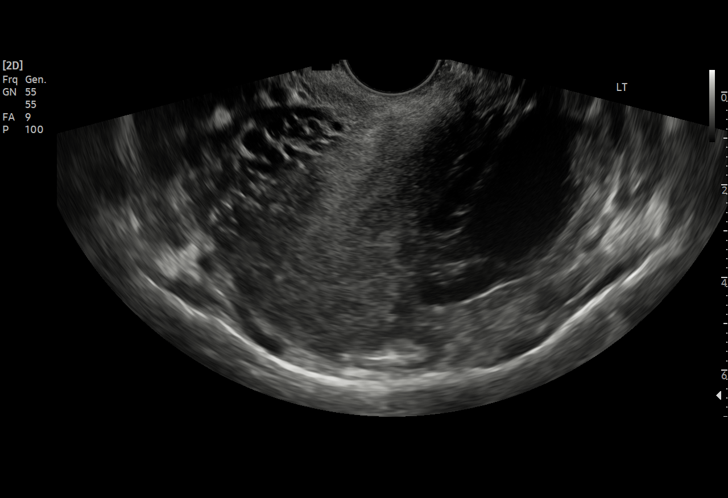
[im 38/100]
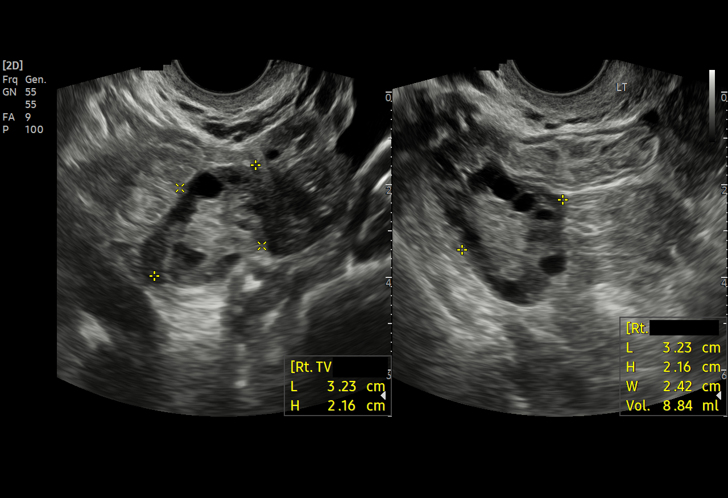
[im 42/100]
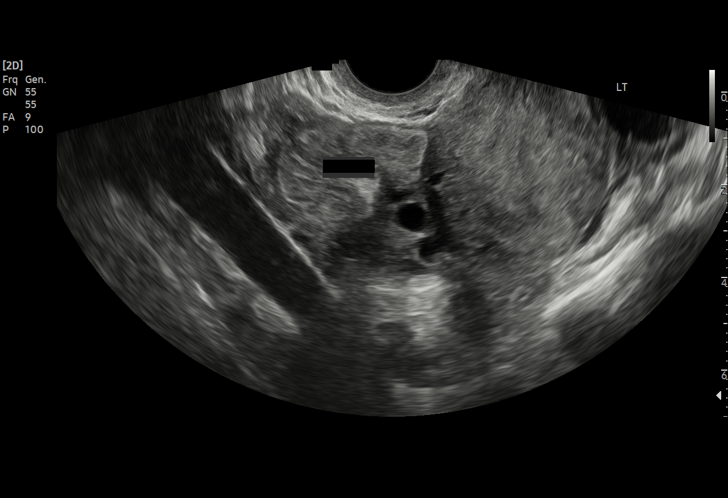
[im 50/100]
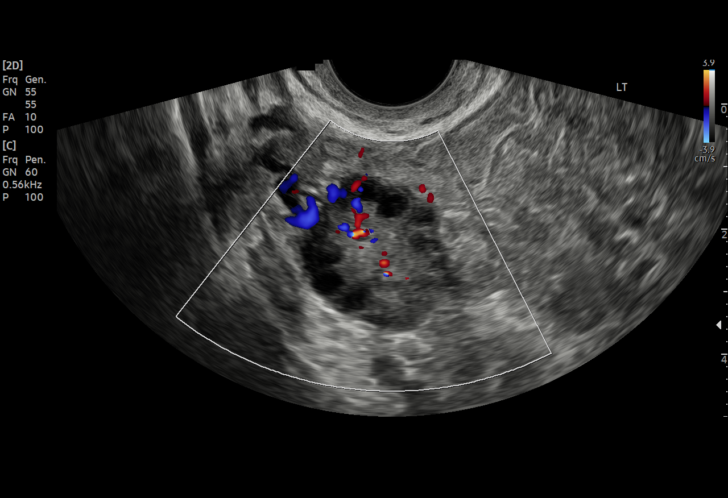
[im 58/100]
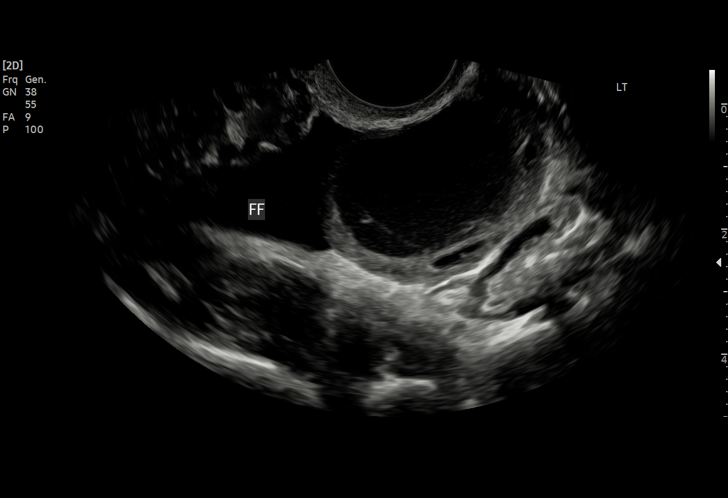
[im 62/100]
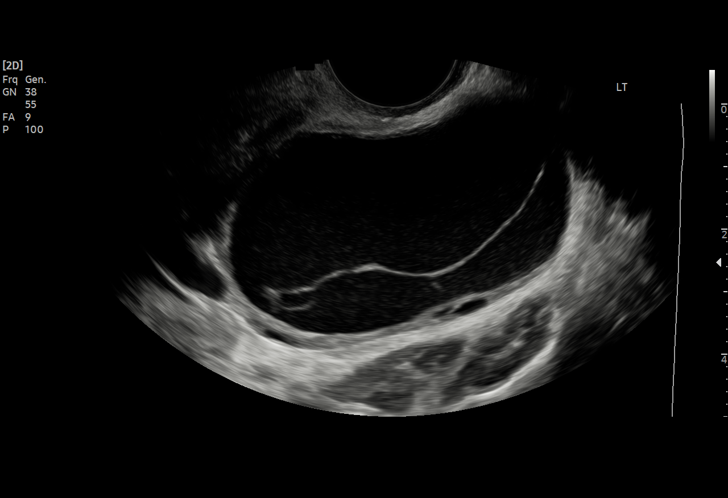
[im 71/100]
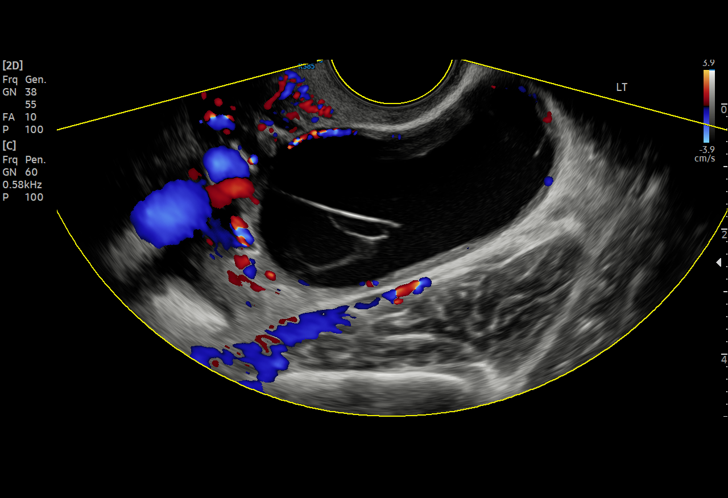
[im 79/100]
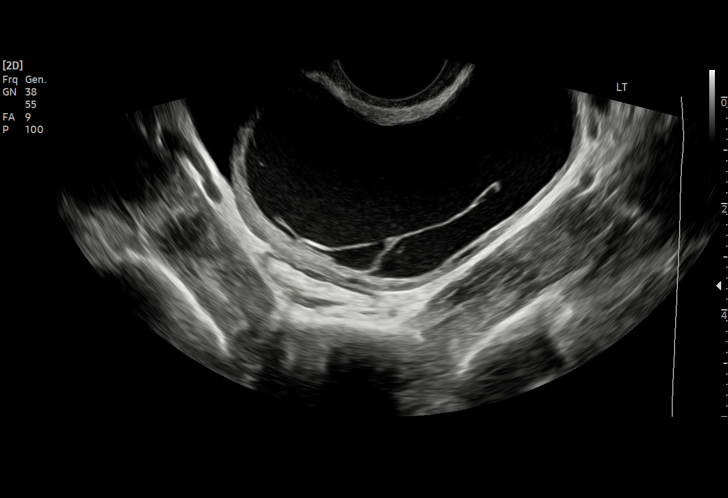
[im 83/100]
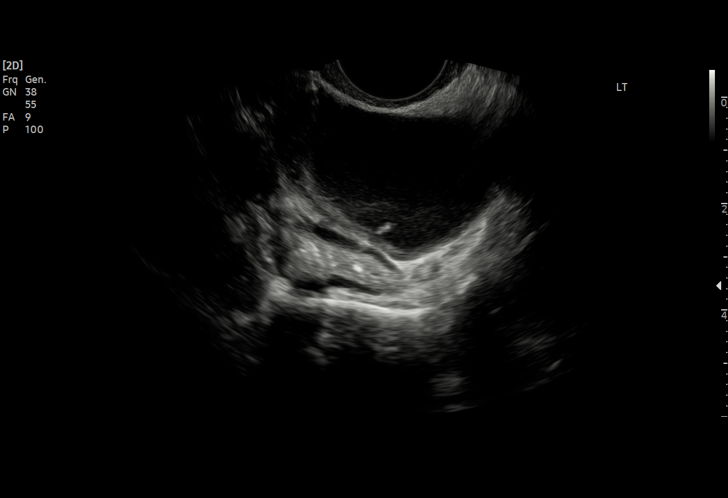
[im 91/100]
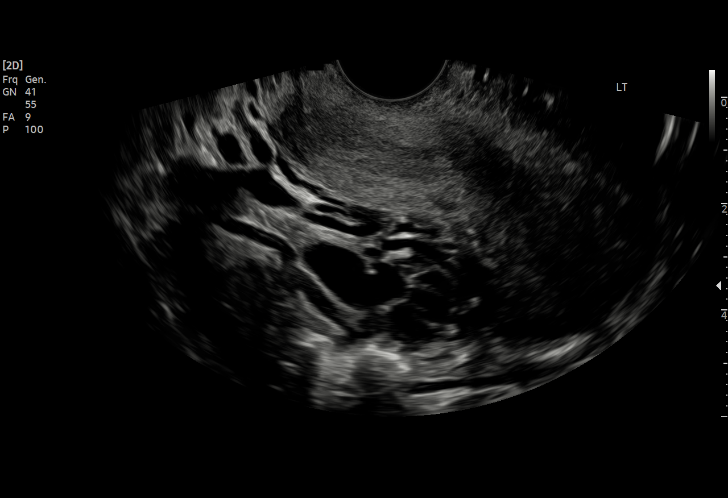
[im 100/100]
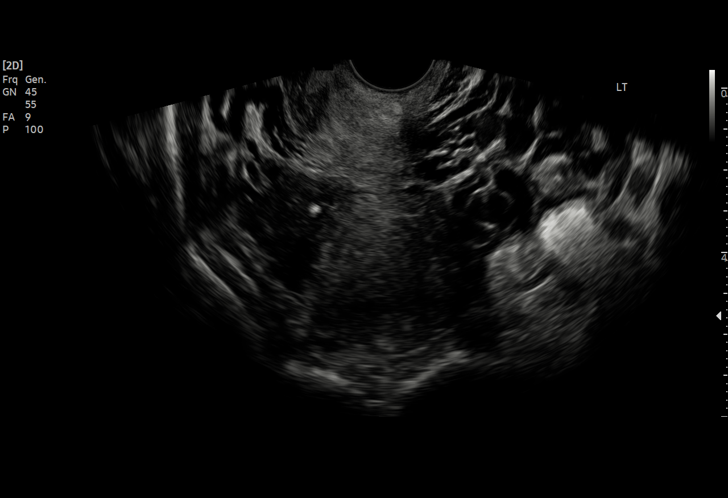

[15 of 25 positions shown; findings below may reference images not displayed]

FINDINGS: Uterus

Measurements: 7.4 x 3.8 x 4.0 cm = volume: 59 mL. No fibroids or
other mass visualized.

Endometrium

Thickness: 9.5 mm.  No focal abnormality visualized.

Right ovary

Measurements: 3.2 x 2.2 x 2.4 cm = volume: 8.8 mL. Normal
appearance/no adnexal mass. Follicles are distributed peripherally.

Left ovary

Measurements: 6.2 x 4.2 x 7.2 cm = volume: 98.6 mL. A septated
irregular walled cyst measures 5.8 x 3.8 x 6.8 cm.

Pulsed Doppler evaluation of both ovaries demonstrates normal
low-resistance arterial and venous waveforms.

Other findings

Moderate amount of free fluid is present. Prominent uterine vessels
noted bilaterally, left greater than right
IMPRESSION: 1. Enlarged septated irregular cyst of the left adnexa measures
x 3.8 x 6.8 cm recommend follow-up US in 3-6 months. Note: This
recommendation does not apply to premenarchal patients or to those
with increased risk (genetic, family history, elevated tumor markers
or other high-risk factors) of ovarian cancer. Reference: Radiology
[DATE]):[id number].
2. Normal color Doppler flow, arterial and venous waveforms without
evidence for ovarian torsion.
3. Normal sonographic appearance of the uterus.

## 2022-06-09 ENCOUNTER — Other Ambulatory Visit: Payer: Self-pay

## 2022-06-09 ENCOUNTER — Inpatient Hospital Stay (HOSPITAL_COMMUNITY)
Admission: RE | Admit: 2022-06-09 | Discharge: 2022-06-11 | DRG: 327 | Disposition: A | Discharge status: Home / Self Care | Payer: Medicaid Other | Attending: Surgery | Admitting: Surgery

## 2022-06-09 ENCOUNTER — Emergency Department (HOSPITAL_COMMUNITY): Payer: Medicaid Other | Admitting: Certified Registered Nurse Anesthetist

## 2022-06-09 ENCOUNTER — Encounter (HOSPITAL_COMMUNITY): Admission: RE | Disposition: A | Discharge status: Home / Self Care | Payer: Self-pay | Source: Home / Self Care

## 2022-06-09 DIAGNOSIS — Z9889 Other specified postprocedural states: Principal | ICD-10-CM

## 2022-06-09 DIAGNOSIS — T148XXA Other injury of unspecified body region, initial encounter: Secondary | ICD-10-CM | POA: Diagnosis present

## 2022-06-09 DIAGNOSIS — S31609A Unspecified open wound of abdominal wall, unspecified quadrant with penetration into peritoneal cavity, initial encounter: Secondary | ICD-10-CM

## 2022-06-09 DIAGNOSIS — D62 Acute posthemorrhagic anemia: Secondary | ICD-10-CM | POA: Diagnosis present

## 2022-06-09 DIAGNOSIS — F17210 Nicotine dependence, cigarettes, uncomplicated: Secondary | ICD-10-CM | POA: Diagnosis present

## 2022-06-09 DIAGNOSIS — S3633XA Laceration of stomach, initial encounter: Secondary | ICD-10-CM | POA: Diagnosis not present

## 2022-06-09 DIAGNOSIS — Z23 Encounter for immunization: Secondary | ICD-10-CM | POA: Diagnosis not present

## 2022-06-09 DIAGNOSIS — Y939 Activity, unspecified: Secondary | ICD-10-CM

## 2022-06-09 DIAGNOSIS — S31624A Laceration with foreign body of abdominal wall, left lower quadrant with penetration into peritoneal cavity, initial encounter: Secondary | ICD-10-CM | POA: Diagnosis present

## 2022-06-09 DIAGNOSIS — S31134A Puncture wound of abdominal wall without foreign body, left lower quadrant without penetration into peritoneal cavity, initial encounter: Secondary | ICD-10-CM | POA: Diagnosis present

## 2022-06-09 HISTORY — PX: LAPAROTOMY: SHX154

## 2022-06-09 HISTORY — DX: Other injury of unspecified body region, initial encounter: T14.8XXA

## 2022-06-09 LAB — COMPREHENSIVE METABOLIC PANEL
ALT: 16 U/L (ref 0–44)
AST: 36 U/L (ref 15–41)
Albumin: 4.4 g/dL (ref 3.5–5.0)
Alkaline Phosphatase: 60 U/L (ref 38–126)
Anion gap: 19 — ABNORMAL HIGH (ref 5–15)
BUN: 16 mg/dL (ref 6–20)
CO2: 16 mmol/L — ABNORMAL LOW (ref 22–32)
Calcium: 9.2 mg/dL (ref 8.9–10.3)
Chloride: 102 mmol/L (ref 98–111)
Creatinine, Ser: 1 mg/dL (ref 0.44–1.00)
GFR, Estimated: 38 mL/min — ABNORMAL LOW (ref >60)
Glucose, Bld: 90 mg/dL (ref 70–99)
Potassium: 4 mmol/L (ref 3.5–5.1)
Sodium: 137 mmol/L (ref 135–145)
Total Bilirubin: 1.1 mg/dL (ref 0.3–1.2)
Total Protein: 8 g/dL (ref 6.5–8.1)

## 2022-06-09 LAB — CBC
HCT: 38.9 % (ref 36.0–46.0)
Hemoglobin: 12.6 g/dL (ref 12.0–15.0)
MCH: 25.1 pg — ABNORMAL LOW (ref 26.0–34.0)
MCHC: 32.4 g/dL (ref 30.0–36.0)
MCV: 77.6 fL — ABNORMAL LOW (ref 80.0–100.0)
Platelets: 381 10*3/uL (ref 150–400)
RBC: 5.01 MIL/uL (ref 3.87–5.11)
RDW: 15.9 % — ABNORMAL HIGH (ref 11.5–15.5)
WBC: 13.5 10*3/uL — ABNORMAL HIGH (ref 4.0–10.5)
nRBC: 0 % (ref 0.0–0.2)

## 2022-06-09 LAB — I-STAT CHEM 8, ED
BUN: 22 mg/dL (ref 8–23)
Calcium, Ion: 1.1 mmol/L — ABNORMAL LOW (ref 1.15–1.40)
Chloride: 105 mmol/L (ref 98–111)
Creatinine, Ser: 1.1 mg/dL — ABNORMAL HIGH (ref 0.44–1.00)
Glucose, Bld: 88 mg/dL (ref 70–99)
HCT: 45 % (ref 36.0–46.0)
Hemoglobin: 15.3 g/dL — ABNORMAL HIGH (ref 12.0–15.0)
Potassium: 4.2 mmol/L (ref 3.5–5.1)
Sodium: 140 mmol/L (ref 135–145)
TCO2: 19 mmol/L — ABNORMAL LOW (ref 22–32)

## 2022-06-09 LAB — POCT I-STAT, CHEM 8
BUN: 22 mg/dL — ABNORMAL HIGH (ref 6–20)
Calcium, Ion: 1.1 mmol/L — ABNORMAL LOW (ref 1.15–1.40)
Chloride: 105 mmol/L (ref 98–111)
Creatinine, Ser: 1.1 mg/dL — ABNORMAL HIGH (ref 0.44–1.00)
Glucose, Bld: 88 mg/dL (ref 70–99)
HCT: 45 % (ref 36.0–46.0)
Hemoglobin: 15.3 g/dL — ABNORMAL HIGH (ref 12.0–15.0)
Potassium: 4.2 mmol/L (ref 3.5–5.1)
Sodium: 140 mmol/L (ref 135–145)
TCO2: 19 mmol/L — ABNORMAL LOW (ref 22–32)

## 2022-06-09 LAB — PROTIME-INR
INR: 1.1 (ref 0.8–1.2)
Prothrombin Time: 14.2 seconds (ref 11.4–15.2)

## 2022-06-09 LAB — LACTIC ACID, PLASMA: Lactic Acid, Venous: 7.3 mmol/L (ref 0.5–1.9)

## 2022-06-09 LAB — ETHANOL: Alcohol, Ethyl (B): 159 mg/dL — ABNORMAL HIGH (ref <10)

## 2022-06-09 LAB — SAMPLE TO BLOOD BANK

## 2022-06-09 SURGERY — LAPAROTOMY, EXPLORATORY
Anesthesia: General

## 2022-06-09 MED ORDER — SUCCINYLCHOLINE CHLORIDE 200 MG/10ML IV SOSY
PREFILLED_SYRINGE | INTRAVENOUS | Status: AC
  Filled 2022-06-09: qty 10

## 2022-06-09 MED ORDER — KETOROLAC TROMETHAMINE 15 MG/ML IJ SOLN
INTRAMUSCULAR | Status: AC
  Filled 2022-06-09: qty 1

## 2022-06-09 MED ORDER — OXYCODONE HCL 5 MG PO TABS
5.0000 mg | ORAL_TABLET | ORAL | Status: DC | PRN
  Administered 2022-06-09 (×2): 10 mg via ORAL
  Administered 2022-06-09: 5 mg via ORAL
  Administered 2022-06-10 – 2022-06-11 (×6): 10 mg via ORAL
  Filled 2022-06-09 (×7): qty 2

## 2022-06-09 MED ORDER — LACTATED RINGERS IV SOLN: INTRAVENOUS | Status: DC

## 2022-06-09 MED ORDER — LORAZEPAM 2 MG/ML IJ SOLN
INTRAMUSCULAR | Status: AC
  Filled 2022-06-09: qty 1

## 2022-06-09 MED ORDER — CLONAZEPAM 0.5 MG PO TABS
0.5000 mg | ORAL_TABLET | Freq: Once | ORAL | Status: AC
  Administered 2022-06-09: 0.5 mg via ORAL
  Filled 2022-06-09: qty 1

## 2022-06-09 MED ORDER — KETOROLAC TROMETHAMINE 15 MG/ML IJ SOLN
30.0000 mg | Freq: Four times a day (QID) | INTRAMUSCULAR | Status: DC
  Administered 2022-06-09: 15 mg via INTRAVENOUS
  Administered 2022-06-09 – 2022-06-11 (×8): 30 mg via INTRAVENOUS
  Filled 2022-06-09 (×8): qty 2

## 2022-06-09 MED ORDER — AMISULPRIDE (ANTIEMETIC) 5 MG/2ML IV SOLN: 10.0000 mg | Freq: Once | INTRAVENOUS | Status: DC | PRN

## 2022-06-09 MED ORDER — ROCURONIUM BROMIDE 10 MG/ML (PF) SYRINGE
PREFILLED_SYRINGE | INTRAVENOUS | Status: DC | PRN
  Administered 2022-06-09: 50 mg via INTRAVENOUS

## 2022-06-09 MED ORDER — KETOROLAC TROMETHAMINE 30 MG/ML IJ SOLN
INTRAMUSCULAR | Status: AC
  Administered 2022-06-09: 30 mg
  Filled 2022-06-09: qty 1

## 2022-06-09 MED ORDER — OXYCODONE HCL 5 MG PO TABS
ORAL_TABLET | ORAL | Status: AC
  Filled 2022-06-09: qty 2

## 2022-06-09 MED ORDER — NICOTINE 14 MG/24HR TD PT24
14.0000 mg | MEDICATED_PATCH | Freq: Every day | TRANSDERMAL | Status: DC
  Administered 2022-06-09 – 2022-06-11 (×3): 14 mg via TRANSDERMAL
  Filled 2022-06-09 (×3): qty 1

## 2022-06-09 MED ORDER — DEXAMETHASONE SODIUM PHOSPHATE 10 MG/ML IJ SOLN
INTRAMUSCULAR | Status: DC | PRN
  Administered 2022-06-09: 10 mg via INTRAVENOUS

## 2022-06-09 MED ORDER — PHENYLEPHRINE 80 MCG/ML (10ML) SYRINGE FOR IV PUSH (FOR BLOOD PRESSURE SUPPORT)
PREFILLED_SYRINGE | INTRAVENOUS | Status: AC
  Filled 2022-06-09: qty 10

## 2022-06-09 MED ORDER — DOCUSATE SODIUM 100 MG PO CAPS
100.0000 mg | ORAL_CAPSULE | Freq: Two times a day (BID) | ORAL | Status: DC
  Administered 2022-06-09 – 2022-06-11 (×4): 100 mg via ORAL
  Filled 2022-06-09 (×4): qty 1

## 2022-06-09 MED ORDER — ROCURONIUM BROMIDE 10 MG/ML (PF) SYRINGE
PREFILLED_SYRINGE | INTRAVENOUS | Status: AC
  Filled 2022-06-09: qty 10

## 2022-06-09 MED ORDER — MIDAZOLAM HCL 2 MG/2ML IJ SOLN
INTRAMUSCULAR | Status: DC | PRN
  Administered 2022-06-09: 2 mg via INTRAVENOUS

## 2022-06-09 MED ORDER — FENTANYL CITRATE (PF) 250 MCG/5ML IJ SOLN
INTRAMUSCULAR | Status: DC | PRN
  Administered 2022-06-09: 100 ug via INTRAVENOUS
  Administered 2022-06-09 (×2): 50 ug via INTRAVENOUS

## 2022-06-09 MED ORDER — MIDAZOLAM HCL 2 MG/2ML IJ SOLN
INTRAMUSCULAR | Status: AC
  Filled 2022-06-09: qty 2

## 2022-06-09 MED ORDER — CEFAZOLIN SODIUM-DEXTROSE 2-3 GM-%(50ML) IV SOLR
INTRAVENOUS | Status: DC | PRN
  Administered 2022-06-09: 2 g via INTRAVENOUS

## 2022-06-09 MED ORDER — DEXAMETHASONE SODIUM PHOSPHATE 10 MG/ML IJ SOLN
INTRAMUSCULAR | Status: AC
  Filled 2022-06-09: qty 1

## 2022-06-09 MED ORDER — FENTANYL CITRATE (PF) 100 MCG/2ML IJ SOLN
25.0000 ug | INTRAMUSCULAR | Status: DC | PRN
  Administered 2022-06-09 (×2): 50 ug via INTRAVENOUS
  Administered 2022-06-09: 25 ug via INTRAVENOUS

## 2022-06-09 MED ORDER — ACETAMINOPHEN 10 MG/ML IV SOLN
INTRAVENOUS | Status: AC
  Filled 2022-06-09: qty 100

## 2022-06-09 MED ORDER — PROPOFOL 10 MG/ML IV BOLUS
INTRAVENOUS | Status: DC | PRN
  Administered 2022-06-09: 150 mg via INTRAVENOUS

## 2022-06-09 MED ORDER — FENTANYL CITRATE (PF) 250 MCG/5ML IJ SOLN
INTRAMUSCULAR | Status: AC
  Filled 2022-06-09: qty 5

## 2022-06-09 MED ORDER — TETANUS-DIPHTH-ACELL PERTUSSIS 5-2.5-18.5 LF-MCG/0.5 IM SUSY
0.5000 mL | PREFILLED_SYRINGE | Freq: Once | INTRAMUSCULAR | Status: AC
  Administered 2022-06-09: 0.5 mL via INTRAMUSCULAR
  Filled 2022-06-09: qty 0.5

## 2022-06-09 MED ORDER — LIDOCAINE 2% (20 MG/ML) 5 ML SYRINGE
INTRAMUSCULAR | Status: DC | PRN
  Administered 2022-06-09: 60 mg via INTRAVENOUS

## 2022-06-09 MED ORDER — SUGAMMADEX SODIUM 200 MG/2ML IV SOLN
INTRAVENOUS | Status: DC | PRN
  Administered 2022-06-09: 150 mg via INTRAVENOUS

## 2022-06-09 MED ORDER — ENOXAPARIN SODIUM 30 MG/0.3ML IJ SOSY
30.0000 mg | PREFILLED_SYRINGE | Freq: Two times a day (BID) | INTRAMUSCULAR | Status: DC
  Administered 2022-06-10 – 2022-06-11 (×3): 30 mg via SUBCUTANEOUS
  Filled 2022-06-09 (×3): qty 0.3

## 2022-06-09 MED ORDER — LIDOCAINE 2% (20 MG/ML) 5 ML SYRINGE
INTRAMUSCULAR | Status: AC
  Filled 2022-06-09: qty 5

## 2022-06-09 MED ORDER — LACTATED RINGERS IV SOLN: INTRAVENOUS | Status: DC | PRN

## 2022-06-09 MED ORDER — CEFAZOLIN SODIUM 1 G IJ SOLR
INTRAMUSCULAR | Status: AC
  Filled 2022-06-09: qty 20

## 2022-06-09 MED ORDER — OXYCODONE HCL 5 MG PO TABS
ORAL_TABLET | ORAL | Status: AC
  Filled 2022-06-09: qty 1

## 2022-06-09 MED ORDER — ONDANSETRON 4 MG PO TBDP: 4.0000 mg | ORAL_TABLET | Freq: Four times a day (QID) | ORAL | Status: DC | PRN

## 2022-06-09 MED ORDER — ACETAMINOPHEN 500 MG PO TABS
1000.0000 mg | ORAL_TABLET | Freq: Four times a day (QID) | ORAL | Status: DC
  Administered 2022-06-09 – 2022-06-11 (×9): 1000 mg via ORAL
  Filled 2022-06-09 (×8): qty 2

## 2022-06-09 MED ORDER — KETOROLAC TROMETHAMINE 30 MG/ML IJ SOLN
INTRAMUSCULAR | Status: AC
  Filled 2022-06-09: qty 1

## 2022-06-09 MED ORDER — PHENYLEPHRINE HCL-NACL 20-0.9 MG/250ML-% IV SOLN
INTRAVENOUS | Status: DC | PRN
  Administered 2022-06-09: 25 ug/min via INTRAVENOUS

## 2022-06-09 MED ORDER — ONDANSETRON HCL 4 MG/2ML IJ SOLN
INTRAMUSCULAR | Status: DC | PRN
  Administered 2022-06-09: 4 mg via INTRAVENOUS

## 2022-06-09 MED ORDER — MORPHINE SULFATE (PF) 4 MG/ML IV SOLN
4.0000 mg | INTRAVENOUS | Status: DC | PRN
  Administered 2022-06-09 – 2022-06-10 (×4): 4 mg via INTRAVENOUS
  Filled 2022-06-09 (×4): qty 1

## 2022-06-09 MED ORDER — ACETAMINOPHEN 500 MG PO TABS
ORAL_TABLET | ORAL | Status: AC
  Filled 2022-06-09: qty 2

## 2022-06-09 MED ORDER — LORAZEPAM 2 MG/ML IJ SOLN
1.0000 mg | Freq: Once | INTRAMUSCULAR | Status: AC
  Administered 2022-06-09: 1 mg via INTRAVENOUS

## 2022-06-09 MED ORDER — ONDANSETRON HCL 4 MG/2ML IJ SOLN
INTRAMUSCULAR | Status: AC
  Filled 2022-06-09: qty 2

## 2022-06-09 MED ORDER — FENTANYL CITRATE (PF) 100 MCG/2ML IJ SOLN
INTRAMUSCULAR | Status: AC
  Filled 2022-06-09: qty 2

## 2022-06-09 MED ORDER — ONDANSETRON HCL 4 MG/2ML IJ SOLN: 4.0000 mg | Freq: Four times a day (QID) | INTRAMUSCULAR | Status: DC | PRN

## 2022-06-09 MED ORDER — SUCCINYLCHOLINE CHLORIDE 200 MG/10ML IV SOSY
PREFILLED_SYRINGE | INTRAVENOUS | Status: DC | PRN
  Administered 2022-06-09: 100 mg via INTRAVENOUS

## 2022-06-09 MED ORDER — METHOCARBAMOL 500 MG PO TABS
1000.0000 mg | ORAL_TABLET | Freq: Three times a day (TID) | ORAL | Status: DC
  Administered 2022-06-09 – 2022-06-11 (×6): 1000 mg via ORAL
  Filled 2022-06-09 (×6): qty 2

## 2022-06-09 MED ORDER — ACETAMINOPHEN 10 MG/ML IV SOLN
1000.0000 mg | Freq: Once | INTRAVENOUS | Status: DC | PRN
  Administered 2022-06-09: 1000 mg via INTRAVENOUS

## 2022-06-09 MED ORDER — METHOCARBAMOL 500 MG PO TABS
ORAL_TABLET | ORAL | Status: AC
  Filled 2022-06-09: qty 2

## 2022-06-09 SURGICAL SUPPLY — 50 items
BLADE CLIPPER SURG (BLADE) IMPLANT
CANISTER SUCT 3000ML PPV (MISCELLANEOUS) ×1 IMPLANT
CHLORAPREP W/TINT 26 (MISCELLANEOUS) ×1 IMPLANT
COVER SURGICAL LIGHT HANDLE (MISCELLANEOUS) ×1 IMPLANT
DRAPE LAPAROSCOPIC ABDOMINAL (DRAPES) ×1 IMPLANT
DRAPE UNIVERSAL (DRAPES) ×1 IMPLANT
DRAPE WARM FLUID 44X44 (DRAPES) ×1 IMPLANT
DRSG OPSITE POSTOP 4X10 (GAUZE/BANDAGES/DRESSINGS) IMPLANT
DRSG OPSITE POSTOP 4X8 (GAUZE/BANDAGES/DRESSINGS) IMPLANT
DRSG TEGADERM 4X4.75 (GAUZE/BANDAGES/DRESSINGS) IMPLANT
ELECT BLADE 4.0 EZ CLEAN MEGAD (MISCELLANEOUS) ×1
ELECT BLADE 6.5 EXT (BLADE) IMPLANT
ELECT CAUTERY BLADE 6.4 (BLADE) ×1 IMPLANT
ELECT REM PT RETURN 9FT ADLT (ELECTROSURGICAL) ×1
ELECTRODE BLDE 4.0 EZ CLN MEGD (MISCELLANEOUS) IMPLANT
ELECTRODE REM PT RTRN 9FT ADLT (ELECTROSURGICAL) ×1 IMPLANT
GAUZE SPONGE 4X4 12PLY STRL (GAUZE/BANDAGES/DRESSINGS) IMPLANT
GLOVE BIO SURGEON STRL SZ 6 (GLOVE) IMPLANT
GLOVE BIO SURGEON STRL SZ 6.5 (GLOVE) ×1 IMPLANT
GLOVE BIOGEL PI IND STRL 6 (GLOVE) ×1 IMPLANT
GLOVE SS BIOGEL STRL SZ 8.5 (GLOVE) IMPLANT
GOWN STRL REUS W/ TWL LRG LVL3 (GOWN DISPOSABLE) ×2 IMPLANT
GOWN STRL REUS W/TWL LRG LVL3 (GOWN DISPOSABLE) ×2
HANDLE SUCTION POOLE (INSTRUMENTS) ×1 IMPLANT
KIT BASIN OR (CUSTOM PROCEDURE TRAY) ×1 IMPLANT
KIT TURNOVER KIT B (KITS) ×1 IMPLANT
LIGASURE IMPACT 36 18CM CVD LR (INSTRUMENTS) IMPLANT
NS IRRIG 1000ML POUR BTL (IV SOLUTION) ×2 IMPLANT
PACK GENERAL/GYN (CUSTOM PROCEDURE TRAY) ×1 IMPLANT
PAD ARMBOARD 7.5X6 YLW CONV (MISCELLANEOUS) ×1 IMPLANT
PENCIL SMOKE EVACUATOR (MISCELLANEOUS) ×1 IMPLANT
SOL PREP POV-IOD 4OZ 10% (MISCELLANEOUS) IMPLANT
SPONGE T-LAP 18X18 ~~LOC~~+RFID (SPONGE) IMPLANT
STAPLER VISISTAT 35W (STAPLE) ×1 IMPLANT
SUCTION POOLE HANDLE (INSTRUMENTS) ×1
SUT PDS AB 1 TP1 54 (SUTURE) IMPLANT
SUT PDS AB 1 TP1 96 (SUTURE) IMPLANT
SUT SILK 2 0 (SUTURE) ×1
SUT SILK 2 0 SH CR/8 (SUTURE) ×1 IMPLANT
SUT SILK 2 0 TIES 10X30 (SUTURE) ×1 IMPLANT
SUT SILK 2-0 18XBRD TIE 12 (SUTURE) IMPLANT
SUT SILK 3 0 (SUTURE) ×1
SUT SILK 3 0 SH CR/8 (SUTURE) ×1 IMPLANT
SUT SILK 3 0 TIES 10X30 (SUTURE) ×1 IMPLANT
SUT SILK 3-0 18XBRD TIE 12 (SUTURE) IMPLANT
SUT VIC AB 2-0 SH 18 (SUTURE) IMPLANT
SUT VIC AB 3-0 SH 18 (SUTURE) IMPLANT
TOWEL GREEN STERILE (TOWEL DISPOSABLE) ×1 IMPLANT
TRAY FOLEY MTR SLVR 16FR STAT (SET/KITS/TRAYS/PACK) IMPLANT
YANKAUER SUCT BULB TIP NO VENT (SUCTIONS) IMPLANT
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
## 2022-06-09 NOTE — Transfer of Care (Signed)
Immediate Anesthesia Transfer of Care Note  Patient: Victoria Cline  Procedure(s) Performed: EXPLORATORY LAPAROTOMY  Patient Location: PACU  Anesthesia Type:General  Level of Consciousness: sedated  Airway & Oxygen Therapy: Patient Spontanous Breathing  Post-op Assessment: Report given to RN, Post -op Vital signs reviewed and stable, and Patient moving all extremities X 4  Post vital signs: Reviewed and stable  Last Vitals:  Vitals Value Taken Time  BP 130/105 06/09/22 0424  Temp    Pulse 104 06/09/22 0428  Resp 25 06/09/22 0428  SpO2 97 % 06/09/22 0428  Vitals shown include unvalidated device data.  Last Pain:  Vitals:   06/09/22 0305  PainSc: 10-Worst pain ever         Complications: No notable events documented.
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
## 2022-06-09 NOTE — Progress Notes (Signed)
   06/09/22 0305  Spiritual Encounters  Type of Visit Initial  Care provided to: Pt not available  Referral source Nurse (RN/NT/LPN)  Reason for visit Trauma  OnCall Visit Yes   Chaplain Jorene Guest responded to the page. The patient is being treated by the medical team. There is no support person present. CH remains available for follow up support as needed. This note was prepared by Jeanine Luz, M.Div..  For questions please contact by phone [phone].

## 2022-06-09 NOTE — Anesthesia Postprocedure Evaluation (Signed)
Anesthesia Post Note  Patient: Victoria Cline  Procedure(s) Performed: EXPLORATORY LAPAROTOMY     Patient location during evaluation: PACU Anesthesia Type: General Level of consciousness: awake and alert Pain management: pain level controlled Vital Signs Assessment: post-procedure vital signs reviewed and stable Respiratory status: spontaneous breathing, nonlabored ventilation, respiratory function stable and patient connected to nasal cannula oxygen Cardiovascular status: blood pressure returned to baseline and stable Postop Assessment: no apparent nausea or vomiting Anesthetic complications: no   No notable events documented.  Last Vitals:  Vitals:   06/09/22 0600 06/09/22 0630  BP: 113/66 107/64  Pulse: 95 93  Resp: 20 20  Temp:    SpO2: 96% 97%    Last Pain:  Vitals:   06/09/22 0630  PainSc: Asleep                 Belenda Cruise P Mykelti Goldenstein
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
## 2022-06-09 NOTE — ED Notes (Signed)
Patient transported to OR at this time.

## 2022-06-09 NOTE — Anesthesia Procedure Notes (Signed)
Procedure Name: Intubation Date/Time: 06/09/2022 3:17 AM  Performed by: Darletta Moll, CRNAPre-anesthesia Checklist: Patient identified, Emergency Drugs available, Suction available and Patient being monitored Patient Re-evaluated:Patient Re-evaluated prior to induction Oxygen Delivery Method: Circle system utilized Preoxygenation: Pre-oxygenation with 100% oxygen Induction Type: IV induction, Rapid sequence and Cricoid Pressure applied Ventilation: Mask ventilation without difficulty Laryngoscope Size: Mac and 3 Grade View: Grade I Tube type: Oral Tube size: 7.0 mm Number of attempts: 1 Airway Equipment and Method: Stylet and Oral airway Placement Confirmation: ETT inserted through vocal cords under direct vision, positive ETCO2 and breath sounds checked- equal and bilateral Secured at: 21 cm Tube secured with: Tape Dental Injury: Teeth and Oropharynx as per pre-operative assessment
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
## 2022-06-09 NOTE — Progress Notes (Signed)
Orthopedic Tech Progress Note Patient Details:  Victoria Cline 04/13/1873 [id number]  Patient ID: Victoria Cline, female   DOB: 04/13/1873, 32 y.o.   MRN: [id number] I attended trauma page. Karolee Stamps 06/09/2022, 3:37 AM

## 2022-06-10 ENCOUNTER — Encounter (HOSPITAL_COMMUNITY): Payer: Self-pay | Admitting: Surgery

## 2022-06-10 LAB — CBC
HCT: 28.1 % — ABNORMAL LOW (ref 36.0–46.0)
Hemoglobin: 9.4 g/dL — ABNORMAL LOW (ref 12.0–15.0)
MCH: 25.1 pg — ABNORMAL LOW (ref 26.0–34.0)
MCHC: 33.5 g/dL (ref 30.0–36.0)
MCV: 75.1 fL — ABNORMAL LOW (ref 80.0–100.0)
Platelets: 273 10*3/uL (ref 150–400)
RBC: 3.74 MIL/uL — ABNORMAL LOW (ref 3.87–5.11)
RDW: 15.1 % (ref 11.5–15.5)
WBC: 8.2 10*3/uL (ref 4.0–10.5)
nRBC: 0 % (ref 0.0–0.2)

## 2022-06-10 LAB — BASIC METABOLIC PANEL
Anion gap: 8 (ref 5–15)
BUN: <5 mg/dL — ABNORMAL LOW (ref 6–20)
CO2: 21 mmol/L — ABNORMAL LOW (ref 22–32)
Calcium: 8.3 mg/dL — ABNORMAL LOW (ref 8.9–10.3)
Chloride: 107 mmol/L (ref 98–111)
Creatinine, Ser: 0.65 mg/dL (ref 0.44–1.00)
GFR, Estimated: >60 mL/min (ref >60)
Glucose, Bld: 99 mg/dL (ref 70–99)
Potassium: 3.9 mmol/L (ref 3.5–5.1)
Sodium: 136 mmol/L (ref 135–145)

## 2022-06-10 MED ORDER — SODIUM CHLORIDE 0.9 % IV SOLN: 250.0000 mL | INTRAVENOUS | Status: DC | PRN

## 2022-06-10 MED ORDER — SODIUM CHLORIDE 0.9% FLUSH
3.0000 mL | Freq: Two times a day (BID) | INTRAVENOUS | Status: DC
  Administered 2022-06-10 – 2022-06-11 (×3): 3 mL via INTRAVENOUS

## 2022-06-10 MED ORDER — HYDROXYZINE HCL 25 MG PO TABS
25.0000 mg | ORAL_TABLET | Freq: Three times a day (TID) | ORAL | Status: DC | PRN
  Administered 2022-06-10 – 2022-06-11 (×2): 25 mg via ORAL
  Filled 2022-06-10 (×2): qty 1

## 2022-06-10 MED ORDER — SODIUM CHLORIDE 0.9% FLUSH
3.0000 mL | INTRAVENOUS | Status: DC | PRN
  Administered 2022-06-11: 3 mL via INTRAVENOUS

## 2022-06-10 NOTE — Plan of Care (Signed)
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
## 2022-06-10 NOTE — Plan of Care (Signed)
  Problem: Pain Managment: Goal: General experience of comfort will improve Outcome: Not Progressing   Problem: Skin Integrity: Goal: Risk for impaired skin integrity will decrease Outcome: Not Progressing   Problem: Elimination: Goal: Will not experience complications related to bowel motility Outcome: Not Progressing
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
## 2022-06-11 ENCOUNTER — Other Ambulatory Visit (HOSPITAL_COMMUNITY): Payer: Self-pay

## 2022-06-11 ENCOUNTER — Encounter (HOSPITAL_COMMUNITY): Payer: Self-pay | Admitting: Emergency Medicine

## 2022-06-11 LAB — CBC
HCT: 28.4 % — ABNORMAL LOW (ref 36.0–46.0)
Hemoglobin: 9.6 g/dL — ABNORMAL LOW (ref 12.0–15.0)
MCH: 25.5 pg — ABNORMAL LOW (ref 26.0–34.0)
MCHC: 33.8 g/dL (ref 30.0–36.0)
MCV: 75.3 fL — ABNORMAL LOW (ref 80.0–100.0)
Platelets: 276 10*3/uL (ref 150–400)
RBC: 3.77 MIL/uL — ABNORMAL LOW (ref 3.87–5.11)
RDW: 15.2 % (ref 11.5–15.5)
WBC: 8.8 10*3/uL (ref 4.0–10.5)
nRBC: 0 % (ref 0.0–0.2)

## 2022-06-11 MED ORDER — METHOCARBAMOL 500 MG PO TABS
1000.0000 mg | ORAL_TABLET | Freq: Three times a day (TID) | ORAL | 0 refills | Status: DC
  Filled 2022-06-11: qty 60, 10d supply, fill #0

## 2022-06-11 MED ORDER — OXYCODONE HCL 5 MG PO TABS
5.0000 mg | ORAL_TABLET | ORAL | 0 refills | Status: DC | PRN
  Filled 2022-06-11: qty 30, 3d supply, fill #0

## 2022-06-11 MED ORDER — DOCUSATE SODIUM 100 MG PO CAPS
100.0000 mg | ORAL_CAPSULE | Freq: Two times a day (BID) | ORAL | 0 refills | Status: DC
  Filled 2022-06-11: qty 10, 5d supply, fill #0

## 2022-06-11 MED ORDER — NICOTINE 14 MG/24HR TD PT24
14.0000 mg | MEDICATED_PATCH | Freq: Every day | TRANSDERMAL | 0 refills | Status: DC
  Filled 2022-06-11: qty 28, 28d supply, fill #0

## 2022-06-11 MED ORDER — ACETAMINOPHEN 500 MG PO TABS: 1000.0000 mg | ORAL_TABLET | Freq: Four times a day (QID) | ORAL | Status: DC | PRN
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
## 2022-06-11 NOTE — Discharge Instructions (Signed)
[facility]      [facility] °[phone] ° °OPEN ABDOMINAL SURGERY: POST OP INSTRUCTIONS ° °Always review your discharge instruction sheet given to you by the facility where your surgery was performed. ° °IF YOU HAVE DISABILITY OR FAMILY LEAVE FORMS, YOU MUST BRING THEM TO THE OFFICE FOR PROCESSING.  PLEASE DO NOT GIVE THEM TO YOUR DOCTOR. ° °A prescription for pain medication may be given to you upon discharge.  Take your pain medication as prescribed, if needed.  If narcotic pain medicine is not needed, then you may take acetaminophen (Tylenol) or ibuprofen (Advil) as needed. °Take your usually prescribed medications unless otherwise directed. °If you need a refill on your pain medication, please contact your pharmacy. They will contact our office to request authorization.  Prescriptions will not be filled after 5pm or on week-ends. °You should follow a light diet the first few days after arrival home, such as soup and crackers, pudding, etc.unless your doctor has advised otherwise. A high-fiber, low fat diet can be resumed as tolerated.   Be sure to include lots of fluids daily. Most patients will experience some swelling and bruising on the chest and neck area.  Ice packs will help.  Swelling and bruising can take several days to resolve °Most patients will experience some swelling and bruising in the area of the incision. Ice pack will help. Swelling and bruising can take several days to resolve..  °It is common to experience some constipation if taking pain medication after surgery.  Increasing fluid intake and taking a stool softener will usually help or prevent this problem from occurring.  A mild laxative (Milk of Magnesia or Miralax) should be taken according to package directions if there are no bowel movements after 48 hours. ° You may have steri-strips (small skin tapes) in place directly over the incision.  These strips should be left on the skin for 7-10 days.  If your surgeon used skin  glue on the incision, you may shower in 24 hours.  The glue will flake off over the next 2-3 weeks.  Any sutures or staples will be removed at the office during your follow-up visit. You may find that a light gauze bandage over your incision may keep your staples from being rubbed or pulled. You may shower and replace the bandage daily. °ACTIVITIES:  You may resume regular (light) daily activities beginning the next day--such as daily self-care, walking, climbing stairs--gradually increasing activities as tolerated.  You may have sexual intercourse when it is comfortable.  Refrain from any heavy lifting or straining until approved by your doctor. °You may drive when you no longer are taking prescription pain medication, you can comfortably wear a seatbelt, and you can safely maneuver your car and apply brakes ° °You should see your doctor in the office for a follow-up appointment approximately two weeks after your surgery.  Make sure that you call for this appointment within a day or two after you arrive home to insure a convenient appointment time. ° °WHEN TO CALL YOUR DOCTOR: °Fever over 101.0 °Inability to urinate °Nausea and/or vomiting °Extreme swelling or bruising °Continued bleeding from incision. °Increased pain, redness, or drainage from the incision. °Difficulty swallowing or breathing °Muscle cramping or spasms. °Numbness or tingling in hands or feet or around lips. ° °The clinic staff is available to answer your questions during regular business hours.  Please don’t hesitate to call and ask to speak to one of the nurses if you have concerns. ° °For   further questions, please visit [email or web address]  °
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
## 2022-06-11 NOTE — Progress Notes (Signed)
   06/11/22 1100  Mobility  Activity Ambulated independently in hallway  Level of Assistance Independent  Assistive Device None  Distance Ambulated (ft) 260 ft  Activity Response Tolerated well  Mobility Referral Yes  $Mobility charge 1 Mobility   Mobility Specialist Progress Note  Pt in bed and agreeable. Had no c/o pain. Returned to bed w/ all needs met and call bell in reach.   Lucious Groves Mobility Specialist  Please contact via SecureChat or Rehab office at [phone]

## 2022-06-11 NOTE — Progress Notes (Signed)
Jamiria N Mandarino to be D/C'd  per MD order.  Discussed with the patient and all questions fully answered.  VSS, Skin clean, dry and intact without evidence of skin break down, no evidence of skin tears noted.  IV catheter discontinued intact. Site without signs and symptoms of complications. Dressing and pressure applied.  An After Visit Summary was printed and given to the patient. Patient received prescription from [facility]. Gave patient dressing supplies.  D/c education completed with patient/family including follow up instructions, medication list, d/c activities limitations if indicated, with other d/c instructions as indicated by MD - patient able to verbalize understanding, all questions fully answered.   Patient instructed to return to ED, call 911, or call MD for any changes in condition.   Patient to be escorted via [place], and D/C home via private auto.
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
## 2022-08-16 ENCOUNTER — Telehealth: Payer: No Typology Code available for payment source | Admitting: Family Medicine

## 2022-08-16 DIAGNOSIS — R0789 Other chest pain: Secondary | ICD-10-CM | POA: Diagnosis not present

## 2022-08-16 MED ORDER — NAPROXEN 500 MG PO TABS
500.0000 mg | ORAL_TABLET | Freq: Two times a day (BID) | ORAL | 0 refills | Status: AC
Start: 2022-08-16 — End: 2022-08-26

## 2022-08-16 NOTE — Patient Instructions (Signed)
Chest Wall Pain Chest wall pain is pain in or around the bones and muscles of your chest. Sometimes, an injury causes this pain. Excessive coughing or overuse of arm and chest muscles may also cause chest wall pain. Sometimes, the cause may not be known. This pain may take several weeks or longer to get better. Follow these instructions at home: Managing pain, stiffness, and swelling  If directed, put ice on the painful area: Put ice in a plastic bag. Place a towel between your skin and the bag. Leave the ice on for 20 minutes, 2-3 times per day. Activity Rest as told by your health care provider. Avoid activities that cause pain. These include any activities that use your chest muscles or your abdominal and side muscles to lift heavy items. Ask your health care provider what activities are safe for you. General instructions  Take over-the-counter and prescription medicines only as told by your health care provider. Do not use any products that contain nicotine or tobacco, such as cigarettes, e-cigarettes, and chewing tobacco. These can delay healing after injury. If you need help quitting, ask your health care provider. Keep all follow-up visits as told by your health care provider. This is important. Contact a health care provider if: You have a fever. Your chest pain becomes worse. You have new symptoms. Get help right away if: You have nausea or vomiting. You feel sweaty or light-headed. You have a cough with mucus from your lungs (sputum) or you cough up blood. You develop shortness of breath. These symptoms may represent a serious problem that is an emergency. Do not wait to see if the symptoms will go away. Get medical help right away. Call your local emergency services (911 in the [place] Do not drive yourself to the hospital. Summary Chest wall pain is pain in or around the bones and muscles of your chest. Depending on the cause, it may be treated with ice, rest, medicines, and  avoiding activities that cause pain. Contact a health care provider if you have a fever, worsening chest pain, or new symptoms. Get help right away if you feel light-headed or you develop shortness of breath. These symptoms may be an emergency. This information is not intended to replace advice given to you by your health care provider. Make sure you discuss any questions you have with your health care provider. Document Revised: 06/11/2020 Document Reviewed: 06/14/2020 Elsevier Patient Education  2023 [employer]vier Inc.  
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
## 2022-09-25 ENCOUNTER — Ambulatory Visit
Admission: EM | Admit: 2022-09-25 | Discharge: 2022-09-25 | Disposition: A | Discharge status: Home / Self Care | Payer: No Typology Code available for payment source | Attending: Internal Medicine | Admitting: Internal Medicine

## 2022-09-25 DIAGNOSIS — K047 Periapical abscess without sinus: Secondary | ICD-10-CM

## 2022-09-25 DIAGNOSIS — K0889 Other specified disorders of teeth and supporting structures: Secondary | ICD-10-CM

## 2022-09-25 MED ORDER — LIDOCAINE VISCOUS HCL 2 % MT SOLN: 15.0000 mL | OROMUCOSAL | 0 refills | Status: DC | PRN

## 2022-09-25 MED ORDER — AMOXICILLIN-POT CLAVULANATE 875-125 MG PO TABS: 1.0000 | ORAL_TABLET | Freq: Two times a day (BID) | ORAL | 0 refills | Status: DC

## 2022-09-25 NOTE — ED Triage Notes (Signed)
Pt reports dental pain x 1 week. Tylenol and ibuprofen gives some relief.
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
## 2022-09-25 NOTE — Discharge Instructions (Signed)
I have prescribed you an antibiotic and a lidocaine solution to apply directly to area.  Follow-up if any symptoms persist or worsen.

## 2022-10-07 ENCOUNTER — Ambulatory Visit
Admission: EM | Admit: 2022-10-07 | Discharge: 2022-10-07 | Disposition: A | Discharge status: Home / Self Care | Payer: No Typology Code available for payment source | Attending: Family Medicine | Admitting: Family Medicine

## 2022-10-07 DIAGNOSIS — K0889 Other specified disorders of teeth and supporting structures: Secondary | ICD-10-CM | POA: Diagnosis not present

## 2022-10-07 DIAGNOSIS — L02411 Cutaneous abscess of right axilla: Secondary | ICD-10-CM | POA: Diagnosis not present

## 2022-10-07 MED ORDER — HYDROCODONE-ACETAMINOPHEN 5-325 MG PO TABS: 1.0000 | ORAL_TABLET | Freq: Four times a day (QID) | ORAL | 0 refills | Status: DC | PRN

## 2022-10-07 MED ORDER — DOXYCYCLINE HYCLATE 100 MG PO CAPS: 100.0000 mg | ORAL_CAPSULE | Freq: Two times a day (BID) | ORAL | 0 refills | Status: DC

## 2022-10-07 NOTE — Discharge Instructions (Addendum)
Be aware, you have been prescribed pain medications that may cause drowsiness. While taking this medication, do not take any other medications containing acetaminophen (Tylenol). Do not combine with alcohol or recreational drugs. Please do not drive, operate heavy machinery, or take part in activities that require making important decisions while on this medication as your judgement may be clouded.  

## 2022-10-07 NOTE — ED Triage Notes (Signed)
Pt reports she has had a boil under her right arm pit x 1 week. Has used warm compresses but no relief. Took ibuprofen but no relief.   Pt has right side teeth pain x 1.5 months. 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 Ibuprofen has helped with teeth pain.

## 2022-10-09 ENCOUNTER — Ambulatory Visit
Admission: EM | Admit: 2022-10-09 | Discharge: 2022-10-09 | Disposition: A | Discharge status: Home / Self Care | Payer: No Typology Code available for payment source

## 2022-10-09 DIAGNOSIS — Z5189 Encounter for other specified aftercare: Secondary | ICD-10-CM | POA: Diagnosis not present

## 2022-10-09 NOTE — ED Triage Notes (Signed)
Patient presents to [facility] for follow-up appt for wound to right axilla. States packing intact and provider instructed to return for wound care.

## 2022-10-09 NOTE — Discharge Instructions (Signed)
Continue and complete antibiotic.  Change dressing daily until healed over.  Follow-up if you have any further concerns.
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
## 2022-10-10 ENCOUNTER — Other Ambulatory Visit: Payer: Self-pay

## 2022-10-10 ENCOUNTER — Ambulatory Visit (HOSPITAL_COMMUNITY)
Admission: EM | Admit: 2022-10-10 | Discharge: 2022-10-10 | Disposition: A | Discharge status: Home / Self Care | Payer: No Typology Code available for payment source

## 2022-10-10 ENCOUNTER — Encounter (HOSPITAL_COMMUNITY): Payer: Self-pay | Admitting: Emergency Medicine

## 2022-10-10 ENCOUNTER — Emergency Department (HOSPITAL_COMMUNITY)
Admission: EM | Admit: 2022-10-10 | Discharge: 2022-10-10 | Discharge status: Against Medical Advice | Payer: No Typology Code available for payment source | Attending: Emergency Medicine | Admitting: Emergency Medicine

## 2022-10-10 DIAGNOSIS — Z5321 Procedure and treatment not carried out due to patient leaving prior to being seen by health care provider: Secondary | ICD-10-CM | POA: Insufficient documentation

## 2022-10-10 DIAGNOSIS — K0889 Other specified disorders of teeth and supporting structures: Secondary | ICD-10-CM
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
## 2022-10-10 NOTE — ED Triage Notes (Signed)
Pt to ED c/o upper left tooth pain x1 month.  Unable to get in with dentist.  Pt able to swallow but painful chewing.
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
## 2022-10-10 NOTE — ED Triage Notes (Signed)
Dental pain started 3 days.  Pain is top, left tooth.  Patient was seen in ED 10/10/2022  Has used tylenol, orajel
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
## 2022-11-11 ENCOUNTER — Encounter (HOSPITAL_COMMUNITY): Payer: Self-pay | Admitting: Obstetrics and Gynecology

## 2022-11-11 ENCOUNTER — Inpatient Hospital Stay (HOSPITAL_COMMUNITY)
Admission: AD | Admit: 2022-11-11 | Discharge: 2022-11-11 | Disposition: A | Discharge status: Home / Self Care | Payer: No Typology Code available for payment source | Attending: Obstetrics and Gynecology | Admitting: Obstetrics and Gynecology

## 2022-11-11 ENCOUNTER — Inpatient Hospital Stay (HOSPITAL_COMMUNITY): Payer: No Typology Code available for payment source

## 2022-11-11 DIAGNOSIS — Z3A01 Less than 8 weeks gestation of pregnancy: Secondary | ICD-10-CM | POA: Diagnosis not present

## 2022-11-11 DIAGNOSIS — O99891 Other specified diseases and conditions complicating pregnancy: Secondary | ICD-10-CM | POA: Diagnosis not present

## 2022-11-11 DIAGNOSIS — O3680X0 Pregnancy with inconclusive fetal viability, not applicable or unspecified: Secondary | ICD-10-CM | POA: Diagnosis not present

## 2022-11-11 DIAGNOSIS — O26891 Other specified pregnancy related conditions, first trimester: Secondary | ICD-10-CM | POA: Diagnosis present

## 2022-11-11 LAB — CBC
HCT: 36.7 % (ref 36.0–46.0)
Hemoglobin: 11.8 g/dL — ABNORMAL LOW (ref 12.0–15.0)
MCH: 24.1 pg — ABNORMAL LOW (ref 26.0–34.0)
MCHC: 32.2 g/dL (ref 30.0–36.0)
MCV: 74.9 fL — ABNORMAL LOW (ref 80.0–100.0)
Platelets: 369 10*3/uL (ref 150–400)
RBC: 4.9 MIL/uL (ref 3.87–5.11)
RDW: 16.3 % — ABNORMAL HIGH (ref 11.5–15.5)
WBC: 9.6 10*3/uL (ref 4.0–10.5)
nRBC: 0 % (ref 0.0–0.2)

## 2022-11-11 LAB — URINALYSIS, ROUTINE W REFLEX MICROSCOPIC
Bilirubin Urine: NEGATIVE
Glucose, UA: NEGATIVE mg/dL
Hgb urine dipstick: NEGATIVE
Ketones, ur: NEGATIVE mg/dL
Leukocytes,Ua: NEGATIVE
Nitrite: NEGATIVE
Protein, ur: NEGATIVE mg/dL
Specific Gravity, Urine: 1.026 (ref 1.005–1.030)
pH: 6 (ref 5.0–8.0)

## 2022-11-11 LAB — ABO/RH: ABO/RH(D): B POS

## 2022-11-11 LAB — HCG, QUANTITATIVE, PREGNANCY: hCG, Beta Chain, Quant, S: 43994 m[IU]/mL — ABNORMAL HIGH (ref <5)

## 2022-11-11 LAB — POCT PREGNANCY, URINE: Preg Test, Ur: POSITIVE — AB

## 2022-11-11 MED ORDER — PRENATAL VITAMIN 27-0.8 MG PO TABS: 1.0000 | ORAL_TABLET | Freq: Every day | ORAL | 3 refills | Status: DC

## 2022-11-11 NOTE — Discharge Instructions (Addendum)
Congratulations!! Your due date is EDC: 07/02/2023   [facility] Providers   Center for [employer] [facility] [employer] [facility]             [address], [place], [place] [address] [phone]  Center for [facility] at [facility]                                                             [address], [place], [place], [address] [phone]  Center for [facility] at [facility] [place] [facility] [address], [place], [place], [address] [phone]  Center for [facility] at [facility] [address], [address], [place], [place], [address] [phone]  Center for [facility] at [facility]                                 [address] [place], [place], [place], [address] [phone]  Center for [facility] at [facility]                                    [address], [place], [place], [address] [phone]  Center for [facility] at [facility] [address], [place], [place], [address]                              [facility] [address], [address], [place], [place], [address] [phone]  [facility]         Phone: [phone]  [facility]      Phone: [phone]   [facility]      Phone: [phone]  [facility]         Phone: [phone]   [facility]    Phone: [phone]  [facility]      Phone: [phone]  [facility]     Phone: [phone]  [facility]        Phone: [phone]  [facility]) [phone]  [facility]      Phone: [phone]

## 2022-11-11 NOTE — MAU Provider Note (Signed)
Faculty [facility] OB/GYN Attending MAU Note  Chief Complaint: Abdominal Pain and Back Pain    None     SUBJECTIVE Victoria Cline is a 32 y.o. G2P0020 at Unknown by LMP who presents with positive home UPT. Reports some abdominal pain. Denies bleeding.  Past Medical History:  Diagnosis Date   Anxiety    Asthma    Fracture, pelvis closed (HCC)    Pneumothorax    OB History  Gravida Para Term Preterm AB Living  2 0 0 0 2    SAB IAB Ectopic Multiple Live Births  2 0 0        # Outcome Date GA Lbr Len/2nd Weight Sex Type Anes PTL Lv  2 SAB           1 SAB             Obstetric Comments  Early SABs x 2 as teenager   Past Surgical History:  Procedure Laterality Date   LAPAROTOMY N/A 06/09/2022   Procedure: EXPLORATORY LAPAROTOMY;  Surgeon: Diamantina Monks, MD;  Location: [facility] OR;  Service: General;  Laterality: N/A;   NO PAST SURGERIES     Social History   Socioeconomic History   Marital status: Single    Spouse name: Not on file   Number of children: Not on file   Years of education: Not on file   Highest education level: Not on file  Occupational History   Not on file  Tobacco Use   Smoking status: Every Day    Current packs/day: 0.25    Average packs/day: 0.3 packs/day for 5.0 years (1.3 ttl pk-yrs)    Types: Cigarettes   Smokeless tobacco: Never  Vaping Use   Vaping status: Never Used  Substance and Sexual Activity   Alcohol use: Yes    Comment: occasional   Drug use: Yes    Types: Marijuana   Sexual activity: Yes    Birth control/protection: None  Other Topics Concern   Not on file  Social History Narrative   ** Merged History Encounter **       Lives with girlfriend.     Social Determinants of Health   Financial Resource Strain: Not on file  Food Insecurity: Food Insecurity Present (06/09/2022)   Hunger Vital Sign    Worried About Running Out of Food in the Last Year: Sometimes true    Ran Out of Food in the Last Year: Not on file   Transportation Needs: Not on file  Physical Activity: Not on file  Stress: Not on file  Social Connections: Unknown (08/24/2021)   Received [facility]   Social Network    Social Network: Not on file  Intimate Partner Violence: Unknown (07/17/2021)   Received from [facility]   HITS    Physically Hurt: Not on file    Insult or Talk Down To: Not on file    Threaten Physical Harm: Not on file    Scream or Curse: Not on file   No current facility-administered medications on file prior to encounter.   Current Outpatient Medications on File Prior to Encounter  Medication Sig Dispense Refill   amitriptyline (ELAVIL) 10 MG tablet Take 1 tablet (10 mg total) by mouth at bedtime. (Patient not taking: Reported on 10/11/2021) 30 tablet 5   docusate sodium (COLACE) 100 MG capsule Take 1 capsule (100 mg total) by mouth 2 (two) times daily. (Patient not taking: Reported on 10/10/2022) 10 capsule 0   doxycycline (VIBRAMYCIN)  100 MG capsule Take 1 capsule (100 mg total) by mouth 2 (two) times daily. (Patient not taking: Reported on 10/10/2022) 14 capsule 0   HYDROcodone-acetaminophen (NORCO/VICODIN) 5-325 MG tablet Take 1 tablet by mouth every 6 (six) hours as needed for moderate pain or severe pain. (Patient not taking: Reported on 10/10/2022) 8 tablet 0   ibuprofen (ADVIL) 600 MG tablet Take 1 tablet (600 mg total) by mouth every 6 (six) hours as needed for mild pain. 30 tablet 0   methocarbamol (ROBAXIN) 500 MG tablet Take 2 tablets (1,000 mg total) by mouth every 8 (eight) hours. (Patient not taking: Reported on 10/10/2022) 60 tablet 0   nicotine (NICODERM CQ - DOSED IN MG/24 HOURS) 14 mg/24hr patch Place 1 patch (14 mg total) onto the skin daily. 28 patch 0   omeprazole (PRILOSEC) 20 MG capsule Take 1 capsule (20 mg total) by mouth daily. (Patient not taking: Reported on 10/11/2021) 30 capsule 0   No Known Allergies  ROS: Pertinent items in HPI  OBJECTIVE BP (!) 122/59 (BP Location: Right Arm)    Pulse 98   Temp 98.2 F (36.8 C) (Oral)   Resp 16   Ht 5\' 3"  (1.6 m)   Wt 57.5 kg   SpO2 99%   BMI 22.46 kg/m  CONSTITUTIONAL: Well-developed, well-nourished female in no acute distress.  HENT:  Normocephalic, atraumatic, External right and left ear normal. Oropharynx is clear and moist EYES: Conjunctivae and EOM are normal.  No scleral icterus.  NECK: Normal range of motion, supple, no masses.  Normal thyroid.  SKIN: Skin is warm and dry. No rash noted. Not diaphoretic. No erythema. No pallor. NEUROLGIC: Alert and oriented to person, place, and time. CARDIOVASCULAR: Normal heart rate noted RESPIRATORY: Effort and breath sounds normal, no problems with respiration noted. ABDOMEN: Soft, normal bowel sounds, no distention noted.  No tenderness, rebound or guarding.  MUSCULOSKELETAL: Normal range of motion. No tenderness.  No cyanosis, clubbing, or edema.  2+ distal pulses.  LAB RESULTS Results for orders placed or performed during the hospital encounter of 11/11/22 (from the past 48 hour(s))  Pregnancy, urine POC     Status: Abnormal   Collection Time: 11/11/22  6:07 PM  Result Value Ref Range   Preg Test, Ur POSITIVE (A) NEGATIVE    Comment:        THE SENSITIVITY OF THIS METHODOLOGY IS >24 mIU/mL   Urinalysis, Routine w reflex microscopic -Urine, Clean Catch     Status: Abnormal   Collection Time: 11/11/22  6:29 PM  Result Value Ref Range   Color, Urine YELLOW YELLOW   APPearance HAZY (A) CLEAR   Specific Gravity, Urine 1.026 1.005 - 1.030   pH 6.0 5.0 - 8.0   Glucose, UA NEGATIVE NEGATIVE mg/dL   Hgb urine dipstick NEGATIVE NEGATIVE   Bilirubin Urine NEGATIVE NEGATIVE   Ketones, ur NEGATIVE NEGATIVE mg/dL   Protein, ur NEGATIVE NEGATIVE mg/dL   Nitrite NEGATIVE NEGATIVE   Leukocytes,Ua NEGATIVE NEGATIVE    Comment: Performed at [facility], [address] [place], [place] [address]  ABO/Rh     Status: None   Collection Time: 11/11/22  6:35 PM  Result Value  Ref Range   ABO/RH(D) B POS    No rh immune globuloin      NOT A RH IMMUNE GLOBULIN CANDIDATE, PT RH POSITIVE Performed at [facility], [address] [place], [place] [address]   CBC     Status: Abnormal  Collection Time: 11/11/22  6:35 PM  Result Value Ref Range   WBC 9.6 4.0 - 10.5 K/uL   RBC 4.90 3.87 - 5.11 MIL/uL   Hemoglobin 11.8 (L) 12.0 - 15.0 g/dL   HCT 40.9 81.1 - 91.4 %   MCV 74.9 (L) 80.0 - 100.0 fL   MCH 24.1 (L) 26.0 - 34.0 pg   MCHC 32.2 30.0 - 36.0 g/dL   RDW 78.2 (H) 95.6 - 21.3 %   Platelets 369 150 - 400 K/uL   nRBC 0.0 0.0 - 0.2 %    Comment: Performed at [facility], [address], [place], [place] [address]    IMAGING No results found. Results reviewed and images reviewed by me, reveal SIUP with + flicker and normal FHR No adnexal mass and no free fluid. Cx is long and closed.  MAU COURSE Blood type is B pos Normal IUP noted  ASSESSMENT 1. [redacted] weeks gestation of pregnancy   2. Pregnancy of unknown anatomic location     PLAN Discharge home Begin PNVs  List of providers given  Allergies as of 11/11/2022   No Known Allergies      Medication List     STOP taking these medications    amitriptyline 10 MG tablet Commonly known as: ELAVIL   docusate sodium 100 MG capsule Commonly known as: COLACE   doxycycline 100 MG capsule Commonly known as: VIBRAMYCIN   HYDROcodone-acetaminophen 5-325 MG tablet Commonly known as: NORCO/VICODIN   ibuprofen 600 MG tablet Commonly known as: ADVIL   methocarbamol 500 MG tablet Commonly known as: ROBAXIN   nicotine 14 mg/24hr patch Commonly known as: NICODERM CQ - dosed in mg/24 hours   omeprazole 20 MG capsule Commonly known as: PRILOSEC       TAKE these medications    Prenatal Vitamin 27-0.8 MG Tabs Take 1 tablet by mouth daily.        Evaluation does not show pathology that would require ongoing emergent intervention or inpatient treatment. Patient is hemodynamically  stable and mentating appropriately. Discussed findings and plan with patient, who agrees with care plan. All questions answered. Return precautions discussed and outpatient follow up recommendations given.  Reva Bores, MD 11/11/2022 7:35 PM

## 2022-11-11 NOTE — MAU Note (Addendum)
...  Victoria Cline is a 32 y.o. at Unknown here in MAU reporting: Positive pregnancy test at home. She reports last month she experienced light pink vaginal spotting. She reports 1.5 weeks ago she began experiencing occasional right sided lower abdominal/pelvic pain that radiates around to her back. Last felt these pains one hour ago. Denies current vaginal bleeding. Denies vaginal discharge, vaginal itching, and vaginal odors.  She reports this is her first pregnancy. RN asked patient about previous miscarriages as her G's and P's states she has never been pregnant. She reports her sister has been pregnant and that their names are similar.  LMP: 08/28/2022 last normal flow, 09/28/2022 spotting for three days.  Pain score: Denies current pain.  FHT: n/a Lab orders placed from triage:  POCT Preg

## 2022-12-06 ENCOUNTER — Inpatient Hospital Stay (HOSPITAL_COMMUNITY)
Admission: AD | Admit: 2022-12-06 | Discharge: 2022-12-07 | Disposition: A | Discharge status: Home / Self Care | Payer: No Typology Code available for payment source | Attending: Certified Nurse Midwife | Admitting: Certified Nurse Midwife

## 2022-12-06 DIAGNOSIS — R110 Nausea: Secondary | ICD-10-CM | POA: Diagnosis not present

## 2022-12-06 DIAGNOSIS — N949 Unspecified condition associated with female genital organs and menstrual cycle: Secondary | ICD-10-CM | POA: Insufficient documentation

## 2022-12-06 DIAGNOSIS — O26891 Other specified pregnancy related conditions, first trimester: Secondary | ICD-10-CM | POA: Insufficient documentation

## 2022-12-06 DIAGNOSIS — O26899 Other specified pregnancy related conditions, unspecified trimester: Secondary | ICD-10-CM

## 2022-12-06 DIAGNOSIS — R109 Unspecified abdominal pain: Secondary | ICD-10-CM | POA: Insufficient documentation

## 2022-12-06 DIAGNOSIS — O99331 Smoking (tobacco) complicating pregnancy, first trimester: Secondary | ICD-10-CM | POA: Diagnosis not present

## 2022-12-06 DIAGNOSIS — R0602 Shortness of breath: Secondary | ICD-10-CM | POA: Diagnosis present

## 2022-12-06 DIAGNOSIS — F17210 Nicotine dependence, cigarettes, uncomplicated: Secondary | ICD-10-CM | POA: Insufficient documentation

## 2022-12-06 DIAGNOSIS — Z3A10 10 weeks gestation of pregnancy: Secondary | ICD-10-CM | POA: Diagnosis not present

## 2022-12-06 NOTE — MAU Note (Signed)
..  Victoria Cline is a 32 y.o. at [redacted]w[redacted]d here in MAU reporting: abdominal pain that feels like tightening and throbbing, that starts in her lower abdomen goes to her mid-upper abdomen and then radiates to her butt hole. began a couple hours ago.  Shortness of breath that began shortly after the pain started, feels it more when the pain is intense or she is walking.  Denies vaginal bleeding.  Has not taken anything for the pain  Onset of complaint: couple hours ago Pain score: 8/10 Vitals:   12/06/22 2305  BP: (!) 120/53  Pulse: 90  Resp: 20  Temp: 99.1 F (37.3 C)  SpO2: 100%     FHT: unable to doppler in triage Lab orders placed from triage: UA

## 2022-12-06 NOTE — MAU Provider Note (Signed)
History     CSN: [id number]  Arrival date and time: 12/06/22 2219   Event Date/Time   First Provider Initiated Contact with Patient 12/06/22 2355      Chief Complaint  Patient presents with   Abdominal Pain   Shortness of Breath   Victoria Cline , a  32 y.o. G3P0020 at [redacted]w[redacted]d presents to MAU with complaints of abdominal pain and back pain that started "a few hours ago.'" She states that the pain feels like "throbbing, but becomes sharp when touched." She states the pain started in her back and wraps around her hips and to the front of her pelvis, and radiates upwards. States that it is intermittent and only last about 50 seconds but reports that it is starting to occur more frequently.  She also reports nausea without vomiting. She rates pain a 8/10 and denies attempting to relieve symptoms. She denies abnormal vaginal discharge and or urinary symptoms. She states she has not have had problems with constipation and reports 2 BMs today.    She also reports shortness of breath but states its only associated with the pain and walking.       Abdominal Pain Associated symptoms include nausea. Pertinent negatives include no constipation, diarrhea, dysuria, fever, headaches or vomiting.  Shortness of Breath Associated symptoms include abdominal pain. Pertinent negatives include no chest pain, fever, headaches, vomiting or wheezing.    OB History     Gravida  3   Para  0   Term  0   Preterm  0   AB  2   Living         SAB  2   IAB  0   Ectopic  0   Multiple      Live Births           Obstetric Comments  Early SABs x 2 as teenager         Past Medical History:  Diagnosis Date   Anxiety    Asthma    Fracture, pelvis closed (HCC)    Pneumothorax     Past Surgical History:  Procedure Laterality Date   LAPAROTOMY N/A 06/09/2022   Procedure: EXPLORATORY LAPAROTOMY;  Surgeon: Diamantina Monks, MD;  Location: [facility] OR;  Service: General;  Laterality: N/A;    NO PAST SURGERIES      Family History  Problem Relation Age of Onset   Cancer Mother        Living, brain tumor    Social History   Tobacco Use   Smoking status: Every Day    Current packs/day: 0.25    Average packs/day: 0.3 packs/day for 5.0 years (1.3 ttl pk-yrs)    Types: Cigarettes   Smokeless tobacco: Never  Vaping Use   Vaping status: Never Used  Substance Use Topics   Alcohol use: Yes    Comment: occasional   Drug use: Yes    Types: Marijuana    Allergies: No Known Allergies  Medications Prior to Admission  Medication Sig Dispense Refill Last Dose   Prenatal Vit-Fe Fumarate-FA (PRENATAL VITAMIN) 27-0.8 MG TABS Take 1 tablet by mouth daily. 90 tablet 3     Review of Systems  Constitutional:  Negative for chills, fatigue and fever.  Eyes:  Negative for pain and visual disturbance.  Respiratory:  Positive for shortness of breath. Negative for apnea and wheezing.   Cardiovascular:  Negative for chest pain and palpitations.  Gastrointestinal:  Positive for abdominal pain and nausea. Negative for  constipation, diarrhea and vomiting.  Genitourinary:  Positive for pelvic pain. Negative for difficulty urinating, dysuria, vaginal bleeding, vaginal discharge and vaginal pain.  Musculoskeletal:  Positive for back pain.  Neurological:  Negative for seizures, weakness and headaches.  Psychiatric/Behavioral:  Negative for suicidal ideas.    Physical Exam   Blood pressure (!) 120/53, pulse 90, temperature 99.1 F (37.3 C), temperature source Oral, resp. rate 20, height 5\' 3"  (1.6 m), weight 61 kg, last menstrual period 10/01/2022, SpO2 100%.  Physical Exam Vitals and nursing note reviewed.  Constitutional:      General: She is not in acute distress.    Appearance: Normal appearance. She is ill-appearing.  HENT:     Head: Normocephalic.  Pulmonary:     Effort: Pulmonary effort is normal.  Musculoskeletal:     Cervical back: Normal range of motion.  Skin:     General: Skin is warm and dry.  Neurological:     Mental Status: She is alert and oriented to person, place, and time.  Psychiatric:        Mood and Affect: Mood normal.    Patient informed that the ultrasound is considered a limited OB ultrasound and is not intended to be a complete ultrasound exam.  Patient also informed that the ultrasound is not being completed with the intent of assessing for fetal or placental anomalies or any pelvic abnormalities.  Explained that the purpose of today's ultrasound is to assess for viability.  Patient acknowledges the purpose of the exam and the limitations of the study.  FHT 150's on doppler via bedside US.   Claudette Head, CNM  12/07/2022 1:13 AM   MAU Course  Procedures Orders Placed This Encounter  Procedures   US RENAL   MR ABDOMEN WO CONTRAST   Urinalysis, Routine w reflex microscopic -Urine, Clean Catch   CBC with Differential/Platelet   Comprehensive metabolic panel   Results for orders placed or performed during the hospital encounter of 12/06/22 (from the past 24 hour(s))  Urinalysis, Routine w reflex microscopic -Urine, Clean Catch     Status: Abnormal   Collection Time: 12/06/22 11:06 PM  Result Value Ref Range   Color, Urine YELLOW YELLOW   APPearance HAZY (A) CLEAR   Specific Gravity, Urine 1.018 1.005 - 1.030   pH 5.0 5.0 - 8.0   Glucose, UA NEGATIVE NEGATIVE mg/dL   Hgb urine dipstick NEGATIVE NEGATIVE   Bilirubin Urine NEGATIVE NEGATIVE   Ketones, ur NEGATIVE NEGATIVE mg/dL   Protein, ur NEGATIVE NEGATIVE mg/dL   Nitrite NEGATIVE NEGATIVE   Leukocytes,Ua TRACE (A) NEGATIVE   RBC / HPF 0-5 0 - 5 RBC/hpf   WBC, UA 11-20 0 - 5 WBC/hpf   Bacteria, UA RARE (A) NONE SEEN   Squamous Epithelial / HPF 0-5 0 - 5 /HPF   Mucus PRESENT   CBC with Differential/Platelet     Status: Abnormal   Collection Time: 12/07/22 12:22 AM  Result Value Ref Range   WBC 13.0 (H) 4.0 - 10.5 K/uL   RBC 4.47 3.87 - 5.11 MIL/uL   Hemoglobin 11.1  (L) 12.0 - 15.0 g/dL   HCT 69.6 (L) 29.5 - 28.4 %   MCV 76.1 (L) 80.0 - 100.0 fL   MCH 24.8 (L) 26.0 - 34.0 pg   MCHC 32.6 30.0 - 36.0 g/dL   RDW 13.2 (H) 44.0 - 10.2 %   Platelets 324 150 - 400 K/uL   nRBC 0.0 0.0 - 0.2 %   Neutrophils  Relative % 69 %   Neutro Abs 9.1 (H) 1.7 - 7.7 K/uL   Lymphocytes Relative 22 %   Lymphs Abs 2.9 0.7 - 4.0 K/uL   Monocytes Relative 7 %   Monocytes Absolute 0.8 0.1 - 1.0 K/uL   Eosinophils Relative 1 %   Eosinophils Absolute 0.1 0.0 - 0.5 K/uL   Basophils Relative 0 %   Basophils Absolute 0.0 0.0 - 0.1 K/uL   Immature Granulocytes 1 %   Abs Immature Granulocytes 0.06 0.00 - 0.07 K/uL  Comprehensive metabolic panel     Status: Abnormal   Collection Time: 12/07/22 12:22 AM  Result Value Ref Range   Sodium 132 (L) 135 - 145 mmol/L   Potassium 3.6 3.5 - 5.1 mmol/L   Chloride 101 98 - 111 mmol/L   CO2 23 22 - 32 mmol/L   Glucose, Bld 106 (H) 70 - 99 mg/dL   BUN 5 (L) 6 - 20 mg/dL   Creatinine, Ser 9.56 0.44 - 1.00 mg/dL   Calcium 9.3 8.9 - 21.3 mg/dL   Total Protein 6.5 6.5 - 8.1 g/dL   Albumin 3.6 3.5 - 5.0 g/dL   AST 14 (L) 15 - 41 U/L   ALT 13 0 - 44 U/L   Alkaline Phosphatase 69 38 - 126 U/L   Total Bilirubin 0.4 0.3 - 1.2 mg/dL   GFR, Estimated >08 >65 mL/min   Anion gap 8 5 - 15  US RENAL  Result Date: 12/07/2022 CLINICAL DATA:  [redacted] weeks pregnant, presenting with back pain. EXAM: RENAL / URINARY TRACT ULTRASOUND COMPLETE COMPARISON:  None Available. FINDINGS: Right Kidney: Renal measurements: 11.7 cm x 4.7 cm x 5.4 cm = volume: 156.3 mL. Echogenicity within normal limits. No mass or hydronephrosis visualized. Left Kidney: Renal measurements: 12.1 cm x 5.5 cm x 5.0 cm = volume: 174.5 mL. Echogenicity within normal limits. No mass or hydronephrosis visualized. Bladder: Appears normal for degree of bladder distention. The bilateral ureteral jets are visualized. Other: None. IMPRESSION: Unremarkable renal ultrasound. Electronically Signed   By:  Aram Candela M.D.   On: 12/07/2022 01:34     MDM - Mildly elevated WBC. Can be expected finding in pregnancy.  - UA reflexed to culture to rule out UTI  - Renal US normal. Low suspicion for stones causing pain  - PreLim MRI results revealed relatively normal appearing appendix.  - Reassessment Pain 4/10 s/p PO Tylenol and flexeril.  - plan for discharge.   Assessment and Plan   1. Abdominal pain affecting pregnancy   2. [redacted] weeks gestation of pregnancy   3. Round ligament pain    - Reviewed round ligament pain as a normal discomfort of pregnancy.  - UA reflexed to culture. Will notify patient of concerning results via MyChart and plan to treat if needed.  - Patient discharged home in stable condition and may return to MAU as needed.   Claudette Head, MSN CNM  12/06/2022, 11:55 PM

## 2022-12-06 NOTE — MAU Provider Note (Incomplete)
  History     CSN: [id number]  Arrival date and time: 12/06/22 2219   Event Date/Time   First Provider Initiated Contact with Patient 12/06/22 2355      Chief Complaint  Patient presents with  . Abdominal Pain  . Shortness of Breath   Abdominal Pain  Shortness of Breath Associated symptoms include abdominal pain.    {GYN/OB [id number]  Past Medical History:  Diagnosis Date  . Anxiety   . Asthma   . Fracture, pelvis closed (HCC)   . Pneumothorax     Past Surgical History:  Procedure Laterality Date  . LAPAROTOMY N/A 06/09/2022   Procedure: EXPLORATORY LAPAROTOMY;  Surgeon: Diamantina Monks, MD;  Location: [facility] OR;  Service: General;  Laterality: N/A;  . NO PAST SURGERIES      Family History  Problem Relation Age of Onset  . Cancer Mother        Living, brain tumor    Social History   Tobacco Use  . Smoking status: Every Day    Current packs/day: 0.25    Average packs/day: 0.3 packs/day for 5.0 years (1.3 ttl pk-yrs)    Types: Cigarettes  . Smokeless tobacco: Never  Vaping Use  . Vaping status: Never Used  Substance Use Topics  . Alcohol use: Yes    Comment: occasional  . Drug use: Yes    Types: Marijuana    Allergies: No Known Allergies  Medications Prior to Admission  Medication Sig Dispense Refill Last Dose  . Prenatal Vit-Fe Fumarate-FA (PRENATAL VITAMIN) 27-0.8 MG TABS Take 1 tablet by mouth daily. 90 tablet 3     Review of Systems  Respiratory:  Positive for shortness of breath.   Gastrointestinal:  Positive for abdominal pain.   Physical Exam   Blood pressure (!) 120/53, pulse 90, temperature 99.1 F (37.3 C), temperature source Oral, resp. rate 20, height 5\' 3"  (1.6 m), weight 61 kg, last menstrual period 10/01/2022, SpO2 100%.  Physical Exam  MAU Course  Procedures  MDM ***  Assessment and Plan  ***  Claudette Head 12/06/2022, 11:55 PM

## 2022-12-07 ENCOUNTER — Inpatient Hospital Stay (HOSPITAL_COMMUNITY): Payer: No Typology Code available for payment source

## 2022-12-07 ENCOUNTER — Encounter (HOSPITAL_COMMUNITY): Payer: Self-pay | Admitting: Obstetrics & Gynecology

## 2022-12-07 DIAGNOSIS — Z3A10 10 weeks gestation of pregnancy: Secondary | ICD-10-CM

## 2022-12-07 DIAGNOSIS — N949 Unspecified condition associated with female genital organs and menstrual cycle: Secondary | ICD-10-CM | POA: Diagnosis not present

## 2022-12-07 DIAGNOSIS — R109 Unspecified abdominal pain: Secondary | ICD-10-CM | POA: Diagnosis not present

## 2022-12-07 DIAGNOSIS — O26891 Other specified pregnancy related conditions, first trimester: Secondary | ICD-10-CM

## 2022-12-07 LAB — URINALYSIS, ROUTINE W REFLEX MICROSCOPIC
Bilirubin Urine: NEGATIVE
Glucose, UA: NEGATIVE mg/dL
Hgb urine dipstick: NEGATIVE
Ketones, ur: NEGATIVE mg/dL
Nitrite: NEGATIVE
Protein, ur: NEGATIVE mg/dL
Specific Gravity, Urine: 1.018 (ref 1.005–1.030)
pH: 5 (ref 5.0–8.0)

## 2022-12-07 LAB — CBC WITH DIFFERENTIAL/PLATELET
Abs Immature Granulocytes: 0.06 10*3/uL (ref 0.00–0.07)
Basophils Absolute: 0 10*3/uL (ref 0.0–0.1)
Basophils Relative: 0 %
Eosinophils Absolute: 0.1 10*3/uL (ref 0.0–0.5)
Eosinophils Relative: 1 %
HCT: 34 % — ABNORMAL LOW (ref 36.0–46.0)
Hemoglobin: 11.1 g/dL — ABNORMAL LOW (ref 12.0–15.0)
Immature Granulocytes: 1 %
Lymphocytes Relative: 22 %
Lymphs Abs: 2.9 10*3/uL (ref 0.7–4.0)
MCH: 24.8 pg — ABNORMAL LOW (ref 26.0–34.0)
MCHC: 32.6 g/dL (ref 30.0–36.0)
MCV: 76.1 fL — ABNORMAL LOW (ref 80.0–100.0)
Monocytes Absolute: 0.8 10*3/uL (ref 0.1–1.0)
Monocytes Relative: 7 %
Neutro Abs: 9.1 10*3/uL — ABNORMAL HIGH (ref 1.7–7.7)
Neutrophils Relative %: 69 %
Platelets: 324 10*3/uL (ref 150–400)
RBC: 4.47 MIL/uL (ref 3.87–5.11)
RDW: 16.8 % — ABNORMAL HIGH (ref 11.5–15.5)
WBC: 13 10*3/uL — ABNORMAL HIGH (ref 4.0–10.5)
nRBC: 0 % (ref 0.0–0.2)

## 2022-12-07 LAB — COMPREHENSIVE METABOLIC PANEL
ALT: 13 U/L (ref 0–44)
AST: 14 U/L — ABNORMAL LOW (ref 15–41)
Albumin: 3.6 g/dL (ref 3.5–5.0)
Alkaline Phosphatase: 69 U/L (ref 38–126)
Anion gap: 8 (ref 5–15)
BUN: 5 mg/dL — ABNORMAL LOW (ref 6–20)
CO2: 23 mmol/L (ref 22–32)
Calcium: 9.3 mg/dL (ref 8.9–10.3)
Chloride: 101 mmol/L (ref 98–111)
Creatinine, Ser: 0.57 mg/dL (ref 0.44–1.00)
GFR, Estimated: >60 mL/min (ref >60)
Glucose, Bld: 106 mg/dL — ABNORMAL HIGH (ref 70–99)
Potassium: 3.6 mmol/L (ref 3.5–5.1)
Sodium: 132 mmol/L — ABNORMAL LOW (ref 135–145)
Total Bilirubin: 0.4 mg/dL (ref 0.3–1.2)
Total Protein: 6.5 g/dL (ref 6.5–8.1)

## 2022-12-07 MED ORDER — ACETAMINOPHEN 500 MG PO TABS
1000.0000 mg | ORAL_TABLET | Freq: Once | ORAL | Status: AC
  Administered 2022-12-07: 1000 mg via ORAL
  Filled 2022-12-07: qty 2

## 2022-12-07 MED ORDER — CYCLOBENZAPRINE HCL 5 MG PO TABS
10.0000 mg | ORAL_TABLET | Freq: Once | ORAL | Status: AC
  Administered 2022-12-07: 10 mg via ORAL
  Filled 2022-12-07: qty 2

## 2022-12-07 MED ORDER — CYCLOBENZAPRINE HCL 5 MG PO TABS: 5.0000 mg | ORAL_TABLET | Freq: Three times a day (TID) | ORAL | 0 refills | Status: DC | PRN

## 2022-12-07 MED ORDER — HYDROXYZINE HCL 10 MG PO TABS
10.0000 mg | ORAL_TABLET | Freq: Once | ORAL | Status: AC
  Administered 2022-12-07: 10 mg via ORAL
  Filled 2022-12-07: qty 1

## 2022-12-07 NOTE — MAU Note (Signed)
Called [facility] at [phone] and spoke to Ray, tech and MRI results   Tech stated there is not a body radiologist tonight and preliminary results are typed on the first image of MRI on series #3. Provider made aware.

## 2022-12-08 LAB — CULTURE, OB URINE

## 2022-12-23 ENCOUNTER — Other Ambulatory Visit (HOSPITAL_COMMUNITY)
Admission: RE | Admit: 2022-12-23 | Discharge: 2022-12-23 | Disposition: A | Discharge status: Home / Self Care | Payer: No Typology Code available for payment source | Source: Ambulatory Visit | Attending: Obstetrics & Gynecology | Admitting: Obstetrics & Gynecology

## 2022-12-23 ENCOUNTER — Encounter: Payer: Self-pay | Admitting: Obstetrics & Gynecology

## 2022-12-23 ENCOUNTER — Ambulatory Visit (INDEPENDENT_AMBULATORY_CARE_PROVIDER_SITE_OTHER): Payer: No Typology Code available for payment source | Admitting: Obstetrics & Gynecology

## 2022-12-23 VITALS — BP 114/76 | HR 92 | Wt 137.0 lb

## 2022-12-23 DIAGNOSIS — A5901 Trichomonal vulvovaginitis: Secondary | ICD-10-CM | POA: Diagnosis not present

## 2022-12-23 DIAGNOSIS — O23591 Infection of other part of genital tract in pregnancy, first trimester: Secondary | ICD-10-CM | POA: Diagnosis not present

## 2022-12-23 DIAGNOSIS — Z3A12 12 weeks gestation of pregnancy: Secondary | ICD-10-CM

## 2022-12-23 DIAGNOSIS — Z3480 Encounter for supervision of other normal pregnancy, unspecified trimester: Secondary | ICD-10-CM | POA: Insufficient documentation

## 2022-12-23 HISTORY — DX: Trichomonal vulvovaginitis: A59.01

## 2022-12-23 MED ORDER — ASPIRIN 81 MG PO TBEC
81.0000 mg | DELAYED_RELEASE_TABLET | Freq: Every day | ORAL | 2 refills | Status: DC
Start: 2022-12-23 — End: 2023-02-17

## 2022-12-23 MED ORDER — CYCLOBENZAPRINE HCL 5 MG PO TABS
5.0000 mg | ORAL_TABLET | Freq: Three times a day (TID) | ORAL | 2 refills | Status: DC | PRN
Start: 2022-12-23 — End: 2023-02-17
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
## 2022-12-24 ENCOUNTER — Encounter: Payer: Self-pay | Admitting: Obstetrics & Gynecology

## 2022-12-24 DIAGNOSIS — A5901 Trichomonal vulvovaginitis: Secondary | ICD-10-CM | POA: Insufficient documentation

## 2022-12-24 LAB — COMPREHENSIVE METABOLIC PANEL
ALT: 10 IU/L (ref 0–32)
AST: 13 IU/L (ref 0–40)
Albumin: 4.5 g/dL (ref 4.0–5.0)
Alkaline Phosphatase: 60 IU/L (ref 44–121)
BUN/Creatinine Ratio: 10 (ref 9–23)
BUN: 5 mg/dL — ABNORMAL LOW (ref 6–20)
Bilirubin Total: 0.2 mg/dL (ref 0.0–1.2)
CO2: 21 mmol/L (ref 20–29)
Calcium: 10.2 mg/dL (ref 8.7–10.2)
Chloride: 100 mmol/L (ref 96–106)
Creatinine, Ser: 0.49 mg/dL — ABNORMAL LOW (ref 0.57–1.00)
Globulin, Total: 2.8 g/dL (ref 1.5–4.5)
Glucose: 87 mg/dL (ref 70–99)
Potassium: 3.8 mmol/L (ref 3.5–5.2)
Sodium: 137 mmol/L (ref 134–144)
Total Protein: 7.3 g/dL (ref 6.0–8.5)
eGFR: 130 mL/min/{1.73_m2} (ref >59)

## 2022-12-24 LAB — CBC/D/PLT+RPR+RH+ABO+RUBIGG...
Antibody Screen: NEGATIVE
Basophils Absolute: 0 10*3/uL (ref 0.0–0.2)
Basos: 0 %
EOS (ABSOLUTE): 0.1 10*3/uL (ref 0.0–0.4)
Eos: 1 %
HCV Ab: NONREACTIVE
HIV Screen 4th Generation wRfx: NONREACTIVE
Hematocrit: 39.7 % (ref 34.0–46.6)
Hemoglobin: 12.6 g/dL (ref 11.1–15.9)
Hepatitis B Surface Ag: NEGATIVE
Immature Grans (Abs): 0 10*3/uL (ref 0.0–0.1)
Immature Granulocytes: 0 %
Lymphocytes Absolute: 2.6 10*3/uL (ref 0.7–3.1)
Lymphs: 21 %
MCH: 25.7 pg — ABNORMAL LOW (ref 26.6–33.0)
MCHC: 31.7 g/dL (ref 31.5–35.7)
MCV: 81 fL (ref 79–97)
Monocytes Absolute: 0.8 10*3/uL (ref 0.1–0.9)
Monocytes: 6 %
Neutrophils Absolute: 9.2 10*3/uL — ABNORMAL HIGH (ref 1.4–7.0)
Neutrophils: 72 %
Platelets: 324 10*3/uL (ref 150–450)
RBC: 4.9 x10E6/uL (ref 3.77–5.28)
RDW: 15.5 % — ABNORMAL HIGH (ref 11.7–15.4)
RPR Ser Ql: NONREACTIVE
Rh Factor: POSITIVE
Rubella Antibodies, IGG: 5.09 {index} (ref >0.99)
WBC: 12.8 10*3/uL — ABNORMAL HIGH (ref 3.4–10.8)

## 2022-12-24 LAB — CYTOLOGY - PAP
Chlamydia: NEGATIVE
Comment: NEGATIVE
Comment: NEGATIVE
Comment: NEGATIVE
Comment: NORMAL
Diagnosis: NEGATIVE
High risk HPV: NEGATIVE
Neisseria Gonorrhea: NEGATIVE
Trichomonas: POSITIVE — AB

## 2022-12-24 LAB — HEMOGLOBIN A1C
Est. average glucose Bld gHb Est-mCnc: 111 mg/dL
Hgb A1c MFr Bld: 5.5 % (ref 4.8–5.6)

## 2022-12-24 LAB — HCV INTERPRETATION

## 2022-12-24 MED ORDER — METRONIDAZOLE 500 MG PO TABS
500.0000 mg | ORAL_TABLET | Freq: Two times a day (BID) | ORAL | 0 refills | Status: AC
Start: 2022-12-24 — End: 2022-12-31

## 2022-12-24 NOTE — Addendum Note (Signed)
Addended by: Jaynie Collins A on: 12/24/2022 04:24 PM   Modules accepted: Orders

## 2022-12-24 NOTE — Progress Notes (Signed)
Patient has trichomonal vaginitis.  Recommend testing for other STIs, also needs to let partner(s) know so the partner(s) can get testing and treatment. Patient and sex partner(s) should abstain from unprotected sexual activity for two weeks after everyone receives appropriate treatment.  Metronidazole was prescribed for patient.  Patient will need to return in about 4 weeks after treatment for repeat test of cure.  Please call to inform patient of results and recommendations, and advise to pick up prescription and take as directed.  Please advise patient to practice safe sex at all times.  Jaynie Collins, MD

## 2022-12-25 ENCOUNTER — Telehealth: Payer: Self-pay | Admitting: *Deleted

## 2022-12-25 NOTE — Telephone Encounter (Signed)
-----   Message from Jaynie Collins sent at 12/24/2022  4:24 PM EDT ----- Please see result note ----- Message ----- From: Nell Range Lab Results In Sent: 12/24/2022   8:37 AM EDT To: Tereso Newcomer, MD

## 2022-12-25 NOTE — Telephone Encounter (Signed)
Attempted to call patient to go over pap resutls, left message to call back and or to look at her result note from Dr Macon Large.

## 2022-12-28 ENCOUNTER — Encounter: Payer: Self-pay | Admitting: Obstetrics & Gynecology

## 2022-12-29 LAB — PANORAMA PRENATAL TEST FULL PANEL:PANORAMA TEST PLUS 5 ADDITIONAL MICRODELETIONS: FETAL FRACTION: 7.7

## 2023-01-04 ENCOUNTER — Encounter: Payer: Self-pay | Admitting: *Deleted

## 2023-01-07 ENCOUNTER — Encounter: Payer: Self-pay | Admitting: Obstetrics & Gynecology

## 2023-01-07 DIAGNOSIS — D563 Thalassemia minor: Secondary | ICD-10-CM | POA: Insufficient documentation

## 2023-01-07 HISTORY — DX: Thalassemia minor: D56.3

## 2023-01-07 LAB — HORIZON CUSTOM: REPORT SUMMARY: POSITIVE — AB

## 2023-01-21 ENCOUNTER — Encounter: Payer: Self-pay | Admitting: Obstetrics and Gynecology

## 2023-01-21 ENCOUNTER — Ambulatory Visit (INDEPENDENT_AMBULATORY_CARE_PROVIDER_SITE_OTHER): Payer: No Typology Code available for payment source | Admitting: Obstetrics and Gynecology

## 2023-01-21 ENCOUNTER — Other Ambulatory Visit: Payer: Self-pay | Admitting: Obstetrics and Gynecology

## 2023-01-21 VITALS — BP 111/67 | HR 90 | Wt 144.0 lb

## 2023-01-21 DIAGNOSIS — O23591 Infection of other part of genital tract in pregnancy, first trimester: Secondary | ICD-10-CM

## 2023-01-21 DIAGNOSIS — Z3A16 16 weeks gestation of pregnancy: Secondary | ICD-10-CM

## 2023-01-21 DIAGNOSIS — Z3480 Encounter for supervision of other normal pregnancy, unspecified trimester: Secondary | ICD-10-CM

## 2023-01-21 DIAGNOSIS — A5901 Trichomonal vulvovaginitis: Secondary | ICD-10-CM

## 2023-01-21 NOTE — Progress Notes (Signed)
ROB   AFP Today.  WG:NFAO
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
## 2023-01-23 LAB — AFP, SERUM, OPEN SPINA BIFIDA
AFP MoM: 0.73
AFP Value: 29.4 ng/mL
Gest. Age on Collection Date: 16.6 wk
Maternal Age At EDD: 30.8 a
OSBR Risk 1 IN: 10000
Test Results:: NEGATIVE
Weight: 144 [lb_av]

## 2023-01-26 ENCOUNTER — Ambulatory Visit: Payer: No Typology Code available for payment source

## 2023-01-26 ENCOUNTER — Other Ambulatory Visit: Payer: Self-pay | Admitting: Obstetrics and Gynecology

## 2023-01-26 MED ORDER — ALBUTEROL SULFATE HFA 108 (90 BASE) MCG/ACT IN AERS: 2.0000 | INHALATION_SPRAY | Freq: Four times a day (QID) | RESPIRATORY_TRACT | 2 refills | Status: AC | PRN

## 2023-01-27 ENCOUNTER — Encounter: Payer: Self-pay | Admitting: *Deleted

## 2023-01-28 ENCOUNTER — Other Ambulatory Visit: Payer: Self-pay | Admitting: *Deleted

## 2023-01-28 ENCOUNTER — Ambulatory Visit: Payer: No Typology Code available for payment source | Attending: Obstetrics and Gynecology

## 2023-01-28 MED ORDER — DOCUSATE SODIUM 100 MG PO CAPS: 100.0000 mg | ORAL_CAPSULE | Freq: Two times a day (BID) | ORAL | 2 refills | Status: DC | PRN

## 2023-02-04 ENCOUNTER — Encounter: Payer: Self-pay | Admitting: *Deleted

## 2023-02-04 ENCOUNTER — Other Ambulatory Visit: Payer: Self-pay | Admitting: *Deleted

## 2023-02-04 ENCOUNTER — Other Ambulatory Visit: Payer: Self-pay

## 2023-02-04 ENCOUNTER — Ambulatory Visit: Payer: No Typology Code available for payment source | Attending: Obstetrics & Gynecology | Admitting: *Deleted

## 2023-02-04 ENCOUNTER — Ambulatory Visit: Payer: No Typology Code available for payment source | Attending: Obstetrics & Gynecology

## 2023-02-04 ENCOUNTER — Ambulatory Visit: Payer: No Typology Code available for payment source

## 2023-02-04 VITALS — BP 119/59 | HR 89

## 2023-02-04 DIAGNOSIS — O4402 Placenta previa specified as without hemorrhage, second trimester: Secondary | ICD-10-CM

## 2023-02-04 DIAGNOSIS — Z3480 Encounter for supervision of other normal pregnancy, unspecified trimester: Secondary | ICD-10-CM

## 2023-02-04 DIAGNOSIS — O4400 Placenta previa specified as without hemorrhage, unspecified trimester: Secondary | ICD-10-CM

## 2023-02-04 DIAGNOSIS — D563 Thalassemia minor: Secondary | ICD-10-CM | POA: Diagnosis not present

## 2023-02-04 DIAGNOSIS — O99012 Anemia complicating pregnancy, second trimester: Secondary | ICD-10-CM | POA: Diagnosis not present

## 2023-02-04 DIAGNOSIS — Z148 Genetic carrier of other disease: Secondary | ICD-10-CM | POA: Diagnosis not present

## 2023-02-04 DIAGNOSIS — Z3A18 18 weeks gestation of pregnancy: Secondary | ICD-10-CM | POA: Diagnosis not present

## 2023-02-04 DIAGNOSIS — Z363 Encounter for antenatal screening for malformations: Secondary | ICD-10-CM | POA: Diagnosis not present

## 2023-02-04 DIAGNOSIS — Z3A12 12 weeks gestation of pregnancy: Secondary | ICD-10-CM | POA: Diagnosis present

## 2023-02-04 DIAGNOSIS — Z362 Encounter for other antenatal screening follow-up: Secondary | ICD-10-CM

## 2023-02-04 DIAGNOSIS — Z818 Family history of other mental and behavioral disorders: Secondary | ICD-10-CM

## 2023-02-04 NOTE — Progress Notes (Signed)
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  DOB: Apr 13, 1991 Age: 32 y.o.   EDD: 07/02/2023 LMP: 08/28/2022 Referring Provider:  Tereso Newcomer, MD   EGA: [redacted]w[redacted]d  Genetic Counselor: Victoria Plumber, MS  OB Hx: [id number] Date of Appointment: 02/04/2023  Accompanied by: Victoria Cline (FOB). Face to Face Time: 30 Minutes   Pregnancy History:   This is Victoria Cline's third pregnancy. She has had two early losses. Denies personal history of diabetes, high blood pressure, thyroid conditions, and seizures. Denies bleeding, infections, and fevers in this pregnancy. Reports cigarette smoking, around 3 cigarettes a day and is trying to quit. Denies using alcohol, or street drugs in this pregnancy. Using tobacco during pregnancy has risks such as a reduction in birth weight, premature rupture of the membranes, premature birth, and pregnancy loss. There is also an increased risk of infantile complications such as sudden infant death syndrome (SIDS), colic, asthma, and childhood obesity. It is recommended that Victoria Cline continue to reduce her smoking and, if possible, quit smoking during this pregnancy.    Family History: A three-generation pedigree was created and scanned into Epic under the Media tab.  Victoria Cline reports her 30 yo was diagnosed with autism recently. He has not had a genetics evaluation. Victoria Cline reports that her paternal cousin has a 85 yo daughter diagnosed with autism around 75-3 yo. Both family members do not have any health concerns.   Victoria Cline's reproductive partner Victoria Cline reports that his 36 yo son was diagnosed with autism around 31 yo. Genetic testing is unknown. No other health concerns.   We discussed that we are unable to directly test for autism in a pregnancy. Genetic testing for individuals with a clinical diagnosis of autism  yields an explanation in only about 20% of cases, and the remaining 80% of cases are left with unknown etiology. Having affected family members may increase the chance that this couple's children will have autism; however, without genetic testing performed on affected family members, it is difficult to assess risk to the pregnancy and other family members. The risk may be up to 50% in the case of an identified genetic cause. We also discussed and offered screening for fragile X syndrome, which is one of the most common causes of inherited intellectual disability and autism in males. Fragile X syndrome affects females as well. Victoria Cline elected fragile X syndrome carrier screening. She requested to have her blood drawn at our MFM clinic on 02/08/2023. Since fragile X syndrome is maternally inherited in affected males, extra testing for Victoria Cline is not indicated.  Victoria Cline reports her 19 yo sister had cervical cancer at 68 yo. We discussed that most cases of cervical cancer are not inherited. Different types of cancer can have a hereditary component that increases an individual's susceptibility to develop cancer; however, most cancers occur by chance due to a combination of genetics and the environment. It is recommended Victoria Cline inform her PCP about her family history so they can discuss appropriate screening and testing options, including a possible referral to cancer genetic counseling.   Victoria Cline reports that his brother has a son in his 70s with Down syndrome. No additional information is known. We discussed that most cases of Down syndrome occur by random chance and are typically not inherited. However, we do not have records to review and cannot rule out  an inherited form of Down syndrome or another genetic condition. The couple was reassured that the Panorama NIPS returned as low-risk for Down syndrome.  If the couple learns of any additional information regarding their affected family members, we are happy to review  and reassess risk.   Maternal ethnicity reported as Black/Indian and paternal ethnicity reported as Black. Denies Ashkenazi Jewish ancestry.  Family history not remarkable for consanguinity, individuals with birth defects, intellectual disability, multiple spontaneous abortions, still births, or unexplained neonatal death.     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 With this result, we know that Victoria Cline has two functional copies of the alpha-globin genes and the other two alpha-globin genes are deleted. Each of Victoria Cline's children will inherit one functional copy of the alpha-globin gene and one deletion from her (-?).  Alpha thalassemia refers to a group of autosomal recessive blood disorders that reduce the amount of hemoglobin, the protein in red blood cells that carries oxygen to tissues throughout the body. Hemoglobin is made up of both alpha globin and beta globin proteins. Alpha thalassemia is different in its inheritance compared to other hemoglobinopathies as there are two alpha-globin genes on each chromosome 16 (??/??). Alpha thalassemia occurs when three or more of the four alpha-globin genes are deleted.  Victoria Cline is not at increased risk to have a baby with fetal hydrops due to Hemoglobin Barts disease (four deleted alpha-globin genes: --/--) regardless of her partner's carrier status. Having four deleted alpha-globin genes results in severe anemia. Affected babies develop symptoms before birth and without treatment typically do not survive the newborn period. If Victoria Cline's reproductive partner is found to be an alpha thalassemia carrier in the cis configuration (two deleted alpha-globin genes on the same chromosome: ??/--) there would be a 50% risk for the current pregnancy to be affected with Hemoglobin H  Disease (three deleted alpha-globin genes:--/-?). Clinical features this condition are highly variable and generally develop in the first years of life. People with severe symptoms may have chronic anemia, liver disease, and bone changes. Some affected individuals do not require blood transfusions while others may require occasional blood transfusions throughout their lifetime.  Given Victoria Cline's carrier screening result, carrier screening for her reproductive partner was offered to determine risk for the current pregnancy. If Victoria Cline's partner is found to be a carrier for alpha thalassemia, given his Black ancestry, it is more likely for him to be a silent carrier (??/-?) or for him to also be a carrier in the trans configuration (-?/-?) as the cis configuration (??/--) has been reported very rarely in individuals with Black ancestry. If both members of a couple are known to be carriers, diagnostic testing is available to determine if the pregnancy is affected. If diagnostic testing via amniocentesis is not desired, alpha thalassemia testing can be completed after birth. Victoria Cline's reproductive partner Victoria Cline elected carrier screening for alpha thalassemia. He requested that his blood be drawn at our MFM clinic on 02/05/2023. He consented that the results be shared with Victoria Cline.  Finally, for the other three conditions screened for (CF, SMA, and beta-hemoglobinopathies), Victoria Cline was not found to be a carrier. A negative result on carrier screening reduces but does not eliminate the chance of being a carrier. Please see report for details.   Previous Testing Completed:  Victoria Cline previously completed cell-free DNA screening (cfDNA)  in this pregnancy. The result is low risk, consistent with a female fetus. This screening significantly reduces but does not eliminate the chance that the current pregnancy has Down syndrome (trisomy 77), trisomy 68, trisomy 49, common sex chromosome conditions, and 22q11.2 microdeletion  syndrome. Please see report for details. Additionally, there are many genetic conditions that cannot be detected by cfDNA.   Victoria Cline previously completed a maternal serum AFP screen in this pregnancy. The result is screen negative. Please see report for details. A negative result reduces the risk that the current pregnancy has an open neural tube defect. Closed neural tube defects and some open defects may not be detected by this screen.   Patient Plan:  Proceed with: Routine prenatal care. Partner carrier screening for alpha thalassemia to be drawn at our MFM clinic on 02/05/2023. Patient carrier screening for fragile X syndrome to be drawn at our MFM clinic on 02/08/2023.  Informed consent was obtained. All questions were answered.   I spent 30 minutes in the care of the patient today, including face-to-face time reviewing and  discussing the genetic test results and available next steps.   Thank you for sharing in the care of  Victoria Cline with [place].  Please do not hesitate to contact us at [phone] if you have any questions.  Victoria Plumber, MS Genetic Counselor  Genetic counseling student involved in appointment: No.

## 2023-02-05 ENCOUNTER — Ambulatory Visit: Payer: No Typology Code available for payment source

## 2023-02-05 ENCOUNTER — Other Ambulatory Visit: Payer: Self-pay | Admitting: *Deleted

## 2023-02-05 ENCOUNTER — Encounter: Payer: Self-pay | Admitting: Obstetrics and Gynecology

## 2023-02-05 DIAGNOSIS — O4402 Placenta previa specified as without hemorrhage, second trimester: Secondary | ICD-10-CM | POA: Insufficient documentation

## 2023-02-05 DIAGNOSIS — D563 Thalassemia minor: Secondary | ICD-10-CM

## 2023-02-05 DIAGNOSIS — O4400 Placenta previa specified as without hemorrhage, unspecified trimester: Secondary | ICD-10-CM

## 2023-02-05 DIAGNOSIS — Z362 Encounter for other antenatal screening follow-up: Secondary | ICD-10-CM

## 2023-02-08 ENCOUNTER — Ambulatory Visit: Payer: No Typology Code available for payment source | Attending: Obstetrics and Gynecology

## 2023-02-17 ENCOUNTER — Ambulatory Visit: Payer: No Typology Code available for payment source | Admitting: Family Medicine

## 2023-02-17 ENCOUNTER — Encounter: Payer: Self-pay | Admitting: *Deleted

## 2023-02-17 VITALS — BP 109/71 | HR 81 | Wt 150.0 lb

## 2023-02-17 DIAGNOSIS — J4540 Moderate persistent asthma, uncomplicated: Secondary | ICD-10-CM

## 2023-02-17 DIAGNOSIS — Z3A12 12 weeks gestation of pregnancy: Secondary | ICD-10-CM

## 2023-02-17 DIAGNOSIS — D563 Thalassemia minor: Secondary | ICD-10-CM

## 2023-02-17 DIAGNOSIS — Z3480 Encounter for supervision of other normal pregnancy, unspecified trimester: Secondary | ICD-10-CM

## 2023-02-17 DIAGNOSIS — Z3A20 20 weeks gestation of pregnancy: Secondary | ICD-10-CM

## 2023-02-17 DIAGNOSIS — O4402 Placenta previa specified as without hemorrhage, second trimester: Secondary | ICD-10-CM

## 2023-02-17 MED ORDER — PRENATAL VITAMIN 27-0.8 MG PO TABS
1.0000 | ORAL_TABLET | Freq: Every day | ORAL | 3 refills | Status: AC
Start: 2023-02-17 — End: ?

## 2023-02-17 MED ORDER — ASPIRIN 81 MG PO TBEC
81.0000 mg | DELAYED_RELEASE_TABLET | Freq: Every day | ORAL | 2 refills | Status: DC
Start: 2023-02-17 — End: 2023-04-26

## 2023-02-17 MED ORDER — CYCLOBENZAPRINE HCL 5 MG PO TABS
5.0000 mg | ORAL_TABLET | Freq: Three times a day (TID) | ORAL | 2 refills | Status: DC | PRN
Start: 2023-02-17 — End: 2023-03-04

## 2023-02-17 MED ORDER — BUDESONIDE-FORMOTEROL FUMARATE 80-4.5 MCG/ACT IN AERO
2.0000 | INHALATION_SPRAY | Freq: Two times a day (BID) | RESPIRATORY_TRACT | 12 refills | Status: AC
Start: 2023-02-17 — End: ?
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
## 2023-02-17 NOTE — Progress Notes (Signed)
Pt needs refill on Flexeril and [facility] new pharmacy was added today.  not able to get aspirin.  CC: Back pain . Pt states she will sometimes have discomfort that is sharp 10/10 will try to reposition episodes can last 30-45 mins

## 2023-02-24 ENCOUNTER — Telehealth: Payer: Self-pay

## 2023-02-24 NOTE — Telephone Encounter (Signed)
I called the patient to discuss her partner's carrier screening results (Shawn Hairston DOB 02/28/1991). He was not found to be a carrier for alpha thalassemia. Please see report for details. A negative result on carrier screening reduces but does not eliminate the chance of being a carrier. Based on these results, this couple's current and future pregnancies would be silent carriers for alpha thalassemia carriers (aa/-a).  Sheppard Plumber, MS Genetic Counselor [facility] [phone]

## 2023-03-04 ENCOUNTER — Inpatient Hospital Stay (HOSPITAL_BASED_OUTPATIENT_CLINIC_OR_DEPARTMENT_OTHER): Payer: No Typology Code available for payment source

## 2023-03-04 ENCOUNTER — Ambulatory Visit: Payer: Self-pay

## 2023-03-04 ENCOUNTER — Encounter (HOSPITAL_COMMUNITY): Payer: Self-pay | Admitting: Obstetrics & Gynecology

## 2023-03-04 ENCOUNTER — Other Ambulatory Visit: Payer: Self-pay

## 2023-03-04 ENCOUNTER — Inpatient Hospital Stay (HOSPITAL_COMMUNITY)
Admission: AD | Admit: 2023-03-04 | Discharge: 2023-03-04 | Disposition: A | Discharge status: Home / Self Care | Payer: No Typology Code available for payment source | Attending: Obstetrics & Gynecology | Admitting: Obstetrics & Gynecology

## 2023-03-04 DIAGNOSIS — Z3A22 22 weeks gestation of pregnancy: Secondary | ICD-10-CM

## 2023-03-04 DIAGNOSIS — O23592 Infection of other part of genital tract in pregnancy, second trimester: Secondary | ICD-10-CM | POA: Insufficient documentation

## 2023-03-04 DIAGNOSIS — Z3A12 12 weeks gestation of pregnancy: Secondary | ICD-10-CM | POA: Diagnosis not present

## 2023-03-04 DIAGNOSIS — O4402 Placenta previa specified as without hemorrhage, second trimester: Secondary | ICD-10-CM | POA: Diagnosis not present

## 2023-03-04 DIAGNOSIS — R102 Pelvic and perineal pain: Secondary | ICD-10-CM | POA: Diagnosis not present

## 2023-03-04 DIAGNOSIS — Z3480 Encounter for supervision of other normal pregnancy, unspecified trimester: Secondary | ICD-10-CM

## 2023-03-04 DIAGNOSIS — R109 Unspecified abdominal pain: Secondary | ICD-10-CM

## 2023-03-04 DIAGNOSIS — O4412 Placenta previa with hemorrhage, second trimester: Secondary | ICD-10-CM | POA: Insufficient documentation

## 2023-03-04 DIAGNOSIS — O99012 Anemia complicating pregnancy, second trimester: Secondary | ICD-10-CM | POA: Diagnosis not present

## 2023-03-04 DIAGNOSIS — B9689 Other specified bacterial agents as the cause of diseases classified elsewhere: Secondary | ICD-10-CM | POA: Insufficient documentation

## 2023-03-04 DIAGNOSIS — O98812 Other maternal infectious and parasitic diseases complicating pregnancy, second trimester: Secondary | ICD-10-CM | POA: Diagnosis not present

## 2023-03-04 DIAGNOSIS — O26891 Other specified pregnancy related conditions, first trimester: Secondary | ICD-10-CM

## 2023-03-04 DIAGNOSIS — O99891 Other specified diseases and conditions complicating pregnancy: Secondary | ICD-10-CM

## 2023-03-04 DIAGNOSIS — B3731 Acute candidiasis of vulva and vagina: Secondary | ICD-10-CM | POA: Diagnosis not present

## 2023-03-04 DIAGNOSIS — O26892 Other specified pregnancy related conditions, second trimester: Secondary | ICD-10-CM | POA: Insufficient documentation

## 2023-03-04 DIAGNOSIS — D563 Thalassemia minor: Secondary | ICD-10-CM

## 2023-03-04 DIAGNOSIS — N949 Unspecified condition associated with female genital organs and menstrual cycle: Secondary | ICD-10-CM

## 2023-03-04 LAB — CBC
HCT: 32.9 % — ABNORMAL LOW (ref 36.0–46.0)
Hemoglobin: 10.5 g/dL — ABNORMAL LOW (ref 12.0–15.0)
MCH: 24.6 pg — ABNORMAL LOW (ref 26.0–34.0)
MCHC: 31.9 g/dL (ref 30.0–36.0)
MCV: 77.2 fL — ABNORMAL LOW (ref 80.0–100.0)
Platelets: 303 10*3/uL (ref 150–400)
RBC: 4.26 MIL/uL (ref 3.87–5.11)
RDW: 14.4 % (ref 11.5–15.5)
WBC: 13 10*3/uL — ABNORMAL HIGH (ref 4.0–10.5)
nRBC: 0 % (ref 0.0–0.2)

## 2023-03-04 LAB — URINALYSIS, ROUTINE W REFLEX MICROSCOPIC
Bilirubin Urine: NEGATIVE
Glucose, UA: NEGATIVE mg/dL
Hgb urine dipstick: NEGATIVE
Ketones, ur: 5 mg/dL — AB
Leukocytes,Ua: NEGATIVE
Nitrite: NEGATIVE
Protein, ur: NEGATIVE mg/dL
Specific Gravity, Urine: 1.009 (ref 1.005–1.030)
pH: 6 (ref 5.0–8.0)

## 2023-03-04 LAB — TYPE AND SCREEN
ABO/RH(D): B POS
Antibody Screen: NEGATIVE

## 2023-03-04 LAB — WET PREP, GENITAL
Clue Cells Wet Prep HPF POC: NONE SEEN
Sperm: NONE SEEN
Trich, Wet Prep: NONE SEEN
WBC, Wet Prep HPF POC: <10 (ref <10)
Yeast Wet Prep HPF POC: NONE SEEN

## 2023-03-04 LAB — GC/CHLAMYDIA PROBE AMP (~~LOC~~) NOT AT ARMC
Chlamydia: NEGATIVE
Comment: NEGATIVE
Comment: NORMAL
Neisseria Gonorrhea: NEGATIVE

## 2023-03-04 MED ORDER — FLUCONAZOLE 150 MG PO TABS: 150.0000 mg | ORAL_TABLET | Freq: Every day | ORAL | 0 refills | Status: DC

## 2023-03-04 MED ORDER — CYCLOBENZAPRINE HCL 5 MG PO TABS: 5.0000 mg | ORAL_TABLET | Freq: Three times a day (TID) | ORAL | 0 refills | Status: DC | PRN

## 2023-03-04 MED ORDER — MORPHINE SULFATE (PF) 4 MG/ML IV SOLN
2.0000 mg | INTRAVENOUS | Status: AC
Start: 2023-03-04 — End: 2023-03-04
  Administered 2023-03-04: 2 mg via INTRAVENOUS
  Filled 2023-03-04: qty 1

## 2023-03-04 MED ORDER — CYCLOBENZAPRINE HCL 5 MG PO TABS
5.0000 mg | ORAL_TABLET | ORAL | Status: AC
  Administered 2023-03-04: 5 mg via ORAL
  Filled 2023-03-04: qty 1

## 2023-03-04 NOTE — MAU Provider Note (Signed)
History     CSN: [id number]  Arrival date and time: 03/04/23 0151   Event Date/Time   First Provider Initiated Contact with Patient    Chief Complaint  Patient presents with   Abdominal Pain   Pelvic Pain   Vaginal Bleeding    HPI  Victoria Cline is a 32 y.o. G3P0020 at [redacted]w[redacted]d who presents to the MAU for vaginal bleeding. Pt with known placenta previa. Reports earlier today, she had spotting once. She also endorses abdominal pain on her sides. Denies recent intercourse or anything in her vagina. Has been following strict pelvic precautions. She currently does not have any vaginal bleeding, no LOF, ctx. Feeling normal FM. Sxs started an hour or so prior to presentation to MAU. No LH, dizziness, f/c, palp, SOB.   Past Medical History:  Diagnosis Date   Adnexal mass 01/21/2021   Alpha thalassemia trait 01/07/2023   Anxiety    Asthma    Exposure to STD 01/31/2012   Fracture, pelvis closed (HCC)    Pelvic pain 01/22/2021   Pneumothorax    Trichomonal vaginitis 12/23/2022    Past Surgical History:  Procedure Laterality Date   LAPAROTOMY N/A 06/09/2022   Procedure: EXPLORATORY LAPAROTOMY;  Surgeon: Diamantina Monks, MD;  Location: [facility];  Service: General;  Laterality: N/A;    Family History  Problem Relation Age of Onset   Cancer Mother        Living, brain tumor    Social History   Tobacco Use   Smoking status: Some Days    Current packs/day: 0.25    Average packs/day: 0.3 packs/day for 5.0 years (1.3 ttl pk-yrs)    Types: Cigarettes   Smokeless tobacco: Never  Vaping Use   Vaping status: Never Used  Substance Use Topics   Alcohol use: Not Currently    Comment: occasional   Drug use: Not Currently    Types: Marijuana    Allergies: No Known Allergies  No medications prior to admission.    ROS reviewed and pertinent positives and negatives as documented in HPI.  Physical Exam   Blood pressure (!) 121/59, pulse 94, temperature 98.3 F (36.8 C),  temperature source Oral, resp. rate 17, height 5\' 3"  (1.6 m), weight 69.2 kg, last menstrual period 08/28/2022, SpO2 97%.  Physical Exam Exam conducted with a chaperone present.  Constitutional:      General: She is not in acute distress.    Appearance: Normal appearance. She is not ill-appearing.  HENT:     Head: Normocephalic and atraumatic.  Cardiovascular:     Rate and Rhythm: Normal rate.  Pulmonary:     Effort: Pulmonary effort is normal.     Breath sounds: Normal breath sounds.  Abdominal:     Palpations: Abdomen is soft.     Tenderness: There is no abdominal tenderness. There is no guarding.  Genitourinary:    Comments: Cervix visually closed on sterile speculum exam. Thick white discharge noted in vaginal vault. No frank blood, clots, or pink-tinged discharge noted. Musculoskeletal:        General: Normal range of motion.  Skin:    General: Skin is warm and dry.     Findings: No rash.  Neurological:     General: No focal deficit present.     Mental Status: She is alert and oriented to person, place, and time.     MAU Course  Procedures  MDM 32 y.o. [id number] at [redacted]w[redacted]d presenting for vaginal bleeding in setting of known  placenta previa. No recent intercourse. No other abnormal discharge. She is well appearing and hemodynamically stable. No evidence of bleeding on sterile speculum exam. Suspect vulvovaginal candidiasis. U/S performed with persistent placenta previa noted. Swabs collected. Discussed importance of pelvic rest, return precautions with patient. Her pain is c/w round ligament pain, will tx with Flexeril. Given a dose of Morphine as well w relief of sxs.   1610 Patient re-evaluated. Pain better with Flexeril and single dose of Morphine, will Rx Flexeril on d/c. No further bleeding. Wet prep neg, though given strong suspicion for vulvovaginal candidiasis, will tx with single dose Diflucan. HgB ok at 10.5, pt is Rh positive. Stable for d/c.   Assessment and Plan   Vulvovaginal candidiasis - Plan: Discharge patient Rx for Diflucan sent  Round ligament pain - Plan: Discharge patient Rx for Flexeril sent  Vaginal bleeding: suspect 2/2 yeast infx, no blood seen in vagina/cervix  return precautions given  Sundra Aland, MD OB Fellow, Faculty Practice [facility], Center for [facility]  03/04/2023, 8:05 AM

## 2023-03-04 NOTE — MAU Note (Addendum)
.  Victoria Cline is a 32 y.o. at [redacted]w[redacted]d here in MAU reporting: pressure in lower abdomen/pelvic along with sharp pain. "Had a little bit of blood come out" - denies current bleeding. Reports seeing spots in her vision. Reports HA. Has placenta previa. Denies LOF. +FM. Denies taking medication for pain.  Onset of complaint: 0000 Pain score: 9 - abdomen; 6 - HA  Vitals:   03/04/23 0210  BP: 123/67  Pulse: 100  Resp: 17  Temp: 98.3 F (36.8 C)  SpO2: 97%     FHT:140 Lab orders placed from triage:  UA

## 2023-03-10 ENCOUNTER — Encounter: Payer: Self-pay | Admitting: *Deleted

## 2023-03-16 ENCOUNTER — Other Ambulatory Visit: Payer: Self-pay | Admitting: Radiology

## 2023-03-19 ENCOUNTER — Ambulatory Visit (INDEPENDENT_AMBULATORY_CARE_PROVIDER_SITE_OTHER): Payer: No Typology Code available for payment source | Admitting: Obstetrics and Gynecology

## 2023-03-19 ENCOUNTER — Encounter: Payer: Self-pay | Admitting: Obstetrics and Gynecology

## 2023-03-19 VITALS — BP 121/75 | HR 112 | Wt 156.4 lb

## 2023-03-19 DIAGNOSIS — O99512 Diseases of the respiratory system complicating pregnancy, second trimester: Secondary | ICD-10-CM

## 2023-03-19 DIAGNOSIS — O23591 Infection of other part of genital tract in pregnancy, first trimester: Secondary | ICD-10-CM

## 2023-03-19 DIAGNOSIS — J45909 Unspecified asthma, uncomplicated: Secondary | ICD-10-CM

## 2023-03-19 DIAGNOSIS — A5901 Trichomonal vulvovaginitis: Secondary | ICD-10-CM

## 2023-03-19 DIAGNOSIS — Z3480 Encounter for supervision of other normal pregnancy, unspecified trimester: Secondary | ICD-10-CM

## 2023-03-19 NOTE — Progress Notes (Signed)
ROB: Bilateral hip pain, continuous . Flexeril not helping
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
## 2023-03-25 ENCOUNTER — Inpatient Hospital Stay (HOSPITAL_BASED_OUTPATIENT_CLINIC_OR_DEPARTMENT_OTHER): Payer: No Typology Code available for payment source

## 2023-03-25 ENCOUNTER — Inpatient Hospital Stay (HOSPITAL_COMMUNITY)
Admission: AD | Admit: 2023-03-25 | Discharge: 2023-03-25 | Disposition: A | Discharge status: Home / Self Care | Payer: No Typology Code available for payment source | Attending: Obstetrics and Gynecology | Admitting: Obstetrics and Gynecology

## 2023-03-25 ENCOUNTER — Encounter (HOSPITAL_COMMUNITY): Payer: Self-pay | Admitting: Obstetrics and Gynecology

## 2023-03-25 DIAGNOSIS — O26892 Other specified pregnancy related conditions, second trimester: Secondary | ICD-10-CM | POA: Diagnosis not present

## 2023-03-25 DIAGNOSIS — O4402 Placenta previa specified as without hemorrhage, second trimester: Secondary | ICD-10-CM

## 2023-03-25 DIAGNOSIS — O99332 Smoking (tobacco) complicating pregnancy, second trimester: Secondary | ICD-10-CM | POA: Diagnosis not present

## 2023-03-25 DIAGNOSIS — O26852 Spotting complicating pregnancy, second trimester: Secondary | ICD-10-CM | POA: Diagnosis not present

## 2023-03-25 DIAGNOSIS — Z3A25 25 weeks gestation of pregnancy: Secondary | ICD-10-CM | POA: Diagnosis not present

## 2023-03-25 DIAGNOSIS — O99012 Anemia complicating pregnancy, second trimester: Secondary | ICD-10-CM | POA: Diagnosis not present

## 2023-03-25 DIAGNOSIS — O99512 Diseases of the respiratory system complicating pregnancy, second trimester: Secondary | ICD-10-CM | POA: Diagnosis not present

## 2023-03-25 DIAGNOSIS — D563 Thalassemia minor: Secondary | ICD-10-CM | POA: Diagnosis not present

## 2023-03-25 DIAGNOSIS — O4412 Placenta previa with hemorrhage, second trimester: Secondary | ICD-10-CM | POA: Diagnosis present

## 2023-03-25 DIAGNOSIS — O26893 Other specified pregnancy related conditions, third trimester: Secondary | ICD-10-CM

## 2023-03-25 LAB — URINALYSIS, ROUTINE W REFLEX MICROSCOPIC
Bilirubin Urine: NEGATIVE
Glucose, UA: NEGATIVE mg/dL
Ketones, ur: NEGATIVE mg/dL
Leukocytes,Ua: NEGATIVE
Nitrite: NEGATIVE
Protein, ur: NEGATIVE mg/dL
Specific Gravity, Urine: 1.014 (ref 1.005–1.030)
pH: 7 (ref 5.0–8.0)

## 2023-03-25 LAB — WET PREP, GENITAL
Clue Cells Wet Prep HPF POC: NONE SEEN
Sperm: NONE SEEN
Trich, Wet Prep: NONE SEEN
WBC, Wet Prep HPF POC: <10 (ref <10)
Yeast Wet Prep HPF POC: NONE SEEN

## 2023-03-25 MED ORDER — ACETAMINOPHEN 500 MG PO TABS: 500.0000 mg | ORAL_TABLET | Freq: Four times a day (QID) | ORAL | 0 refills | Status: DC | PRN

## 2023-03-25 MED ORDER — ACETAMINOPHEN 500 MG PO TABS
1000.0000 mg | ORAL_TABLET | Freq: Once | ORAL | Status: AC
  Administered 2023-03-25: 1000 mg via ORAL
  Filled 2023-03-25: qty 2

## 2023-03-25 MED ORDER — CYCLOBENZAPRINE HCL 5 MG PO TABS
10.0000 mg | ORAL_TABLET | Freq: Three times a day (TID) | ORAL | Status: DC | PRN
  Administered 2023-03-25: 10 mg via ORAL
  Filled 2023-03-25: qty 2

## 2023-03-25 NOTE — MAU Note (Signed)
.  Victoria Cline is a 32 y.o. at [redacted]w[redacted]d here in MAU reporting: Woke up from her nap around 1430 and noted a small amount of dark red blood on her underwear. She denies seeing any blood clots. She reports shortly after waking up she also began to experience intermittent lower abdominal cramps. Denies LOF. +FM.   Posterior placenta previa. Last [place] on 11/21.  Onset of complaint: 1430 Pain score: 8/10 lower abdomen  Vitals:   03/25/23 1509  BP: 126/73  Pulse: (!) 103  Resp: 16  Temp: 98.8 F (37.1 C)  SpO2: 100%      FHT: 145 initial external Lab orders placed from triage: UA
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
## 2023-04-01 ENCOUNTER — Ambulatory Visit: Payer: No Typology Code available for payment source

## 2023-04-08 ENCOUNTER — Other Ambulatory Visit: Payer: No Typology Code available for payment source

## 2023-04-08 ENCOUNTER — Ambulatory Visit (INDEPENDENT_AMBULATORY_CARE_PROVIDER_SITE_OTHER): Payer: No Typology Code available for payment source | Admitting: Obstetrics and Gynecology

## 2023-04-08 VITALS — BP 113/72 | HR 106 | Wt 163.0 lb

## 2023-04-08 DIAGNOSIS — Z23 Encounter for immunization: Secondary | ICD-10-CM | POA: Diagnosis not present

## 2023-04-08 DIAGNOSIS — Z1339 Encounter for screening examination for other mental health and behavioral disorders: Secondary | ICD-10-CM

## 2023-04-08 DIAGNOSIS — Z3480 Encounter for supervision of other normal pregnancy, unspecified trimester: Secondary | ICD-10-CM

## 2023-04-08 DIAGNOSIS — Z3A27 27 weeks gestation of pregnancy: Secondary | ICD-10-CM

## 2023-04-08 DIAGNOSIS — A5901 Trichomonal vulvovaginitis: Secondary | ICD-10-CM

## 2023-04-08 DIAGNOSIS — O23592 Infection of other part of genital tract in pregnancy, second trimester: Secondary | ICD-10-CM

## 2023-04-08 DIAGNOSIS — O4402 Placenta previa specified as without hemorrhage, second trimester: Secondary | ICD-10-CM
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
## 2023-04-09 ENCOUNTER — Encounter: Payer: Self-pay | Admitting: Obstetrics and Gynecology

## 2023-04-09 DIAGNOSIS — O24419 Gestational diabetes mellitus in pregnancy, unspecified control: Secondary | ICD-10-CM | POA: Insufficient documentation

## 2023-04-09 LAB — CBC
Hematocrit: 37 % (ref 34.0–46.6)
Hemoglobin: 11.5 g/dL (ref 11.1–15.9)
MCH: 25.2 pg — ABNORMAL LOW (ref 26.6–33.0)
MCHC: 31.1 g/dL — ABNORMAL LOW (ref 31.5–35.7)
MCV: 81 fL (ref 79–97)
Platelets: 323 10*3/uL (ref 150–450)
RBC: 4.57 x10E6/uL (ref 3.77–5.28)
RDW: 14.8 % (ref 11.7–15.4)
WBC: 13.4 10*3/uL — ABNORMAL HIGH (ref 3.4–10.8)

## 2023-04-09 LAB — HIV ANTIBODY (ROUTINE TESTING W REFLEX): HIV Screen 4th Generation wRfx: NONREACTIVE

## 2023-04-09 LAB — RPR: RPR Ser Ql: NONREACTIVE

## 2023-04-09 LAB — GLUCOSE TOLERANCE, 2 HOURS W/ 1HR
Glucose, 1 hour: 200 mg/dL — ABNORMAL HIGH (ref 70–179)
Glucose, 2 hour: 212 mg/dL — ABNORMAL HIGH (ref 70–152)
Glucose, Fasting: 116 mg/dL — ABNORMAL HIGH (ref 70–91)

## 2023-04-10 ENCOUNTER — Encounter (HOSPITAL_COMMUNITY): Payer: Self-pay | Admitting: Obstetrics & Gynecology

## 2023-04-10 ENCOUNTER — Other Ambulatory Visit: Payer: Self-pay

## 2023-04-10 ENCOUNTER — Inpatient Hospital Stay (HOSPITAL_COMMUNITY)
Admission: AD | Admit: 2023-04-10 | Discharge: 2023-04-15 | DRG: 833 | Disposition: A | Discharge status: Home / Self Care | Payer: No Typology Code available for payment source | Attending: Obstetrics and Gynecology | Admitting: Obstetrics and Gynecology

## 2023-04-10 ENCOUNTER — Inpatient Hospital Stay (HOSPITAL_COMMUNITY): Payer: No Typology Code available for payment source

## 2023-04-10 DIAGNOSIS — O24415 Gestational diabetes mellitus in pregnancy, controlled by oral hypoglycemic drugs: Secondary | ICD-10-CM | POA: Diagnosis present

## 2023-04-10 DIAGNOSIS — Z3480 Encounter for supervision of other normal pregnancy, unspecified trimester: Principal | ICD-10-CM

## 2023-04-10 DIAGNOSIS — O99333 Smoking (tobacco) complicating pregnancy, third trimester: Secondary | ICD-10-CM | POA: Diagnosis present

## 2023-04-10 DIAGNOSIS — Z3A28 28 weeks gestation of pregnancy: Secondary | ICD-10-CM

## 2023-04-10 DIAGNOSIS — O4413 Placenta previa with hemorrhage, third trimester: Principal | ICD-10-CM | POA: Diagnosis present

## 2023-04-10 DIAGNOSIS — O4403 Placenta previa specified as without hemorrhage, third trimester: Secondary | ICD-10-CM | POA: Diagnosis not present

## 2023-04-10 DIAGNOSIS — Z148 Genetic carrier of other disease: Secondary | ICD-10-CM | POA: Diagnosis not present

## 2023-04-10 DIAGNOSIS — F17210 Nicotine dependence, cigarettes, uncomplicated: Secondary | ICD-10-CM | POA: Diagnosis present

## 2023-04-10 DIAGNOSIS — O4402 Placenta previa specified as without hemorrhage, second trimester: Secondary | ICD-10-CM | POA: Diagnosis present

## 2023-04-10 LAB — CBC
HCT: 34.5 % — ABNORMAL LOW (ref 36.0–46.0)
Hemoglobin: 11.3 g/dL — ABNORMAL LOW (ref 12.0–15.0)
MCH: 25.9 pg — ABNORMAL LOW (ref 26.0–34.0)
MCHC: 32.8 g/dL (ref 30.0–36.0)
MCV: 78.9 fL — ABNORMAL LOW (ref 80.0–100.0)
Platelets: 301 10*3/uL (ref 150–400)
RBC: 4.37 MIL/uL (ref 3.87–5.11)
RDW: 15.1 % (ref 11.5–15.5)
WBC: 11.6 10*3/uL — ABNORMAL HIGH (ref 4.0–10.5)
nRBC: 0.2 % (ref 0.0–0.2)

## 2023-04-10 LAB — GLUCOSE, CAPILLARY
Glucose-Capillary: 208 mg/dL — ABNORMAL HIGH (ref 70–99)
Glucose-Capillary: 179 mg/dL — ABNORMAL HIGH (ref 70–99)

## 2023-04-10 LAB — TYPE AND SCREEN
ABO/RH(D): B POS
Antibody Screen: NEGATIVE

## 2023-04-10 LAB — RPR: RPR Ser Ql: NONREACTIVE

## 2023-04-10 MED ORDER — NICOTINE 21 MG/24HR TD PT24
21.0000 mg | MEDICATED_PATCH | Freq: Every day | TRANSDERMAL | Status: DC
  Administered 2023-04-10 – 2023-04-14 (×5): 21 mg via TRANSDERMAL
  Filled 2023-04-10 (×6): qty 1

## 2023-04-10 MED ORDER — LACTATED RINGERS IV SOLN: INTRAVENOUS | Status: AC

## 2023-04-10 MED ORDER — LACTATED RINGERS IV SOLN: 125.0000 mL/h | INTRAVENOUS | Status: AC

## 2023-04-10 MED ORDER — PRENATAL MULTIVITAMIN CH
1.0000 | ORAL_TABLET | Freq: Every day | ORAL | Status: DC
  Administered 2023-04-11 – 2023-04-14 (×5): 1 via ORAL
  Filled 2023-04-10 (×5): qty 1

## 2023-04-10 MED ORDER — ACETAMINOPHEN 325 MG PO TABS
650.0000 mg | ORAL_TABLET | ORAL | Status: DC | PRN
  Administered 2023-04-10 – 2023-04-14 (×12): 650 mg via ORAL
  Filled 2023-04-10 (×12): qty 2

## 2023-04-10 MED ORDER — METFORMIN HCL 500 MG PO TABS
500.0000 mg | ORAL_TABLET | Freq: Two times a day (BID) | ORAL | Status: DC
  Administered 2023-04-10 – 2023-04-15 (×10): 500 mg via ORAL
  Filled 2023-04-10 (×10): qty 1

## 2023-04-10 MED ORDER — CALCIUM CARBONATE ANTACID 500 MG PO CHEW
2.0000 | CHEWABLE_TABLET | ORAL | Status: DC | PRN
  Administered 2023-04-13 – 2023-04-14 (×2): 400 mg via ORAL
  Filled 2023-04-10 (×2): qty 2

## 2023-04-10 MED ORDER — BETAMETHASONE SOD PHOS & ACET 6 (3-3) MG/ML IJ SUSP
12.0000 mg | INTRAMUSCULAR | Status: AC
  Administered 2023-04-10 – 2023-04-11 (×2): 12 mg via INTRAMUSCULAR
  Filled 2023-04-10 (×2): qty 5

## 2023-04-10 MED ORDER — ASPIRIN 81 MG PO TBEC
81.0000 mg | DELAYED_RELEASE_TABLET | Freq: Every day | ORAL | Status: DC
  Administered 2023-04-10 – 2023-04-14 (×5): 81 mg via ORAL
  Filled 2023-04-10 (×5): qty 1

## 2023-04-10 MED ORDER — MAGNESIUM SULFATE 40 GM/1000ML IV SOLN
2.0000 g/h | INTRAVENOUS | Status: AC
  Administered 2023-04-10 – 2023-04-11 (×2): 2 g/h via INTRAVENOUS
  Filled 2023-04-10 (×2): qty 1000

## 2023-04-10 MED ORDER — DOCUSATE SODIUM 100 MG PO CAPS
100.0000 mg | ORAL_CAPSULE | Freq: Every day | ORAL | Status: DC
  Administered 2023-04-10 – 2023-04-14 (×5): 100 mg via ORAL
  Filled 2023-04-10 (×5): qty 1

## 2023-04-10 MED ORDER — CYCLOBENZAPRINE HCL 10 MG PO TABS
5.0000 mg | ORAL_TABLET | Freq: Three times a day (TID) | ORAL | Status: DC | PRN
  Administered 2023-04-10 – 2023-04-14 (×10): 5 mg via ORAL
  Filled 2023-04-10 (×11): qty 1

## 2023-04-10 MED ORDER — MOMETASONE FURO-FORMOTEROL FUM 100-5 MCG/ACT IN AERO
2.0000 | INHALATION_SPRAY | Freq: Two times a day (BID) | RESPIRATORY_TRACT | Status: DC
  Administered 2023-04-10 – 2023-04-15 (×11): 2 via RESPIRATORY_TRACT
  Filled 2023-04-10: qty 8.8

## 2023-04-10 MED ORDER — ALBUTEROL SULFATE (2.5 MG/3ML) 0.083% IN NEBU: 2.5000 mg | INHALATION_SOLUTION | Freq: Four times a day (QID) | RESPIRATORY_TRACT | Status: DC | PRN

## 2023-04-10 NOTE — MAU Note (Addendum)
Pt says she got off work- home at [employer] -  felt like she had to void- and blood came out.  [facility]- [facility]  On arrival- red blood on perineum . Has known complete previa
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
## 2023-04-10 NOTE — Progress Notes (Signed)
CSW acknowledges TOC consult placed for transportation needs. CSW to meet with patient to assist once magnesium infusion is complete.  Signed,  Norberto Sorenson, MSW, LCSWA, LCASA 04/10/2023 8:13 AM

## 2023-04-11 LAB — GLUCOSE, CAPILLARY
Glucose-Capillary: 134 mg/dL — ABNORMAL HIGH (ref 70–99)
Glucose-Capillary: 196 mg/dL — ABNORMAL HIGH (ref 70–99)
Glucose-Capillary: 161 mg/dL — ABNORMAL HIGH (ref 70–99)
Glucose-Capillary: 144 mg/dL — ABNORMAL HIGH (ref 70–99)

## 2023-04-11 NOTE — Inpatient Diabetes Management (Signed)
Inpatient Diabetes Program Recommendations  AACE/ADA: New Consensus Statement on Inpatient Glycemic Control (2015)  Target Ranges:  Prepandial:   less than 140 mg/dL      Peak postprandial:   less than 180 mg/dL (1-2 hours)      Critically ill patients:  140 - 180 mg/dL   Lab Results  Component Value Date   GLUCAP 196 (H) 04/11/2023   HGBA1C 5.5 12/23/2022    Review of Glycemic Control  Diabetes history: None Outpatient Diabetes medications: None Current orders for Inpatient glycemic control: metformin 500 BID, CBG monitoring (2-hr post prandial) 3x daily after meals, FBS every morning  Inpatient Diabetes Program Recommendations:    Use Diabetic Pregnant Patient Order Set with Novolog 0-14 units TID.  Will f/u on 12/30.  Thank you. Ailene Ards, RD, LDN, CDCES Inpatient Diabetes Coordinator [phone]

## 2023-04-11 NOTE — Progress Notes (Signed)
This RN called to patients room.  Patient stated " I had went to bathroom and had some bleeding on pad and small clot on floor."  This RN observed a small amout of dark brown drainage on tissue and adark red/brown  golf ball size clot on floor.  Pt. Stated she doesn't know when she passed the clot.  No further bleeding @ this time.  Patient placed a peri pad and will continue to observe.  Placed on fetal monitor.  Pt. Denies abd. Cramping  and  no pressure.
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
## 2023-04-12 ENCOUNTER — Other Ambulatory Visit (HOSPITAL_COMMUNITY): Payer: Self-pay

## 2023-04-12 ENCOUNTER — Encounter (HOSPITAL_COMMUNITY): Payer: Self-pay | Admitting: Obstetrics & Gynecology

## 2023-04-12 DIAGNOSIS — O4403 Placenta previa specified as without hemorrhage, third trimester: Secondary | ICD-10-CM | POA: Diagnosis present

## 2023-04-12 LAB — GLUCOSE, CAPILLARY
Glucose-Capillary: 134 mg/dL — ABNORMAL HIGH (ref 70–99)
Glucose-Capillary: 141 mg/dL — ABNORMAL HIGH (ref 70–99)
Glucose-Capillary: 105 mg/dL — ABNORMAL HIGH (ref 70–99)
Glucose-Capillary: 120 mg/dL — ABNORMAL HIGH (ref 70–99)
Glucose-Capillary: 117 mg/dL — ABNORMAL HIGH (ref 70–99)
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
## 2023-04-12 NOTE — Inpatient Diabetes Management (Addendum)
Inpatient Diabetes Program Recommendations  Diabetes Treatment Program Recommendations  ADA Standards of Care Diabetes in Pregnancy Target Glucose Ranges:  Fasting: 70 - 95 mg/dL 1 hr postprandial: Less than 140mg /dL (from first bite of meal) 2 hr postprandial: Less than 120 mg/dL (from first bite of meal)     Latest Reference Range & Units 04/11/23 05:38 04/11/23 07:32 04/11/23 15:24 04/11/23 22:07 04/12/23 05:37  Glucose-Capillary 70 - 99 mg/dL 295 (H) 621 (H) 308 (H) 144 (H) 134 (H)   Review of Glycemic Control  Diabetes history: No; new GDM dx Outpatient Diabetes medications: NA Current orders for Inpatient glycemic control: Metformin 500 mg BID  Inpatient Diabetes Program Recommendations:    Insulin: Please consider ordering CBGs fasting and 2 hour post prandial and Novolog 0-14 units TID (2 hour post prandial).   NOTE: Patient admitted with placenta previa with bleeding. Noted consult for Peripartum DM Management. In reviewing chart, no hx of GDM prior to admission. Patient received Betamethasone 12 mg on 12/27 and 12/28.  Will plan to talk with patient today regarding new GDM dx.  Addendum 04/12/23@14 :25-Spoke with patient at bedside regarding glucose and GDM. Discussed basic pathophysiology of GDM, glucose goals, importance of glycemic control, glycemic impact on mother and neonate, potential complications if glucose is not well controlled during pregnancy, gestation carb modified diet, and long term risk of developing DM2 later in life. Discussed Metformin and how it works. Discussed checking glucose fasting and 2 hour post prandial. Patient is interested in using a CGM sensor and her phone is compatible with the FreeStyle Libre3 app. Will ask attending if okay to provide patient with FreeStyle Libre3 and if so will plan to educate patient on it tomorrow and assist her to apply. Discussed Betamethasone and explained that it is a steroid and will contribute to hyperglycemia.  Asked  patient to be sure to stay in close contact with OB regarding glucose after discharge so medications can be adjusted if needed. Patient verbalized understanding of information and has no questions at this time.    Thanks, Orlando Penner, RN, MSN, CDCES Diabetes Coordinator Inpatient Diabetes Program [phone] (Team Pager from 8am to 5pm)
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
## 2023-04-13 DIAGNOSIS — O4403 Placenta previa specified as without hemorrhage, third trimester: Secondary | ICD-10-CM

## 2023-04-13 LAB — GLUCOSE, CAPILLARY
Glucose-Capillary: 95 mg/dL (ref 70–99)
Glucose-Capillary: 133 mg/dL — ABNORMAL HIGH (ref 70–99)

## 2023-04-13 MED ORDER — SODIUM CHLORIDE 0.9% FLUSH
3.0000 mL | Freq: Two times a day (BID) | INTRAVENOUS | Status: DC
  Administered 2023-04-13 – 2023-04-14 (×3): 3 mL via INTRAVENOUS

## 2023-04-13 NOTE — Progress Notes (Signed)
 FACULTY PRACTICE ANTEPARTUM PROGRESS NOTE  Victoria Cline is a 32 y.o. G3P0020 at [redacted]w[redacted]d who is admitted for bleeding placenta previa.  Estimated Date of Delivery: 07/02/23 Fetal presentation is cephalic.  Length of Stay:  3 Days. Admitted 04/10/2023  Subjective: Pt seen and was doing well.  No acute complaints.  She denies recurrence of bleeding. Patient reports normal fetal movement.  She denies uterine contractions, denies bleeding and leaking of fluid per vagina.  Vitals:  Blood pressure 114/62, pulse 97, temperature 98 F (36.7 C), temperature source Oral, resp. rate 16, height 5' 3 (1.6 m), weight 73.9 kg, last menstrual period 08/28/2022, SpO2 100%. Physical Examination: CONSTITUTIONAL: Well-developed, well-nourished female in no acute distress.  HENT:  Normocephalic, atraumatic, External right and left ear normal. Oropharynx is clear and moist EYES: Conjunctivae and EOM are normal.  NECK: Normal range of motion, supple, no masses. SKIN: Skin is warm and dry. No rash noted. Not diaphoretic. No erythema. No pallor. NEUROLGIC: Alert and oriented to person, place, and time. Normal reflexes, muscle tone coordination. No cranial nerve deficit noted. PSYCHIATRIC: Normal mood and affect. Normal behavior. Normal judgment and thought content. CARDIOVASCULAR: Normal heart rate noted, regular rhythm RESPIRATORY: Effort and breath sounds normal, no problems with respiration noted MUSCULOSKELETAL: Normal range of motion. No edema and no tenderness. ABDOMEN: Soft, nontender, nondistended, gravid. CERVIX: deferred  Fetal monitoring: FHR: 150s-160s bpm, Variability: moderate, Accelerations: Present, Decelerations: Absent  Uterine activity: none   Results for orders placed or performed during the hospital encounter of 04/10/23 (from the past 48 hours)  Glucose, capillary     Status: Abnormal   Collection Time: 04/11/23  3:24 PM  Result Value Ref Range   Glucose-Capillary 161 (H) 70 - 99  mg/dL    Comment: Glucose reference range applies only to samples taken after fasting for at least 8 hours.  Glucose, capillary     Status: Abnormal   Collection Time: 04/11/23 10:07 PM  Result Value Ref Range   Glucose-Capillary 144 (H) 70 - 99 mg/dL    Comment: Glucose reference range applies only to samples taken after fasting for at least 8 hours.  Glucose, capillary     Status: Abnormal   Collection Time: 04/12/23  5:37 AM  Result Value Ref Range   Glucose-Capillary 134 (H) 70 - 99 mg/dL    Comment: Glucose reference range applies only to samples taken after fasting for at least 8 hours.  Glucose, capillary     Status: Abnormal   Collection Time: 04/12/23 10:57 AM  Result Value Ref Range   Glucose-Capillary 141 (H) 70 - 99 mg/dL    Comment: Glucose reference range applies only to samples taken after fasting for at least 8 hours.  Glucose, capillary     Status: Abnormal   Collection Time: 04/12/23  4:41 PM  Result Value Ref Range   Glucose-Capillary 105 (H) 70 - 99 mg/dL    Comment: Glucose reference range applies only to samples taken after fasting for at least 8 hours.  Glucose, capillary     Status: Abnormal   Collection Time: 04/12/23  7:37 PM  Result Value Ref Range   Glucose-Capillary 120 (H) 70 - 99 mg/dL    Comment: Glucose reference range applies only to samples taken after fasting for at least 8 hours.  Glucose, capillary     Status: Abnormal   Collection Time: 04/12/23 10:02 PM  Result Value Ref Range   Glucose-Capillary 117 (H) 70 - 99 mg/dL  Comment: Glucose reference range applies only to samples taken after fasting for at least 8 hours.  Glucose, capillary     Status: None   Collection Time: 04/13/23  5:22 AM  Result Value Ref Range   Glucose-Capillary 95 70 - 99 mg/dL    Comment: Glucose reference range applies only to samples taken after fasting for at least 8 hours.    I have reviewed the patient's current medications.  ASSESSMENT: Principal  Problem:   Placenta previa antepartum in third trimester Active Problems:   Placenta previa antepartum in second trimester   PLAN: Previa:  continue inpatient hospital stay total of 7 days.  Can consider discharge home after day 7 if no reoccurrence of bleeding.  Continue daily testing.  A2GDM: FBS today was 95, likely still some residual effect from BMZ, appreciate inpatient diabetic coordinator assistance. Will continue metformin  at current dosage.  Increase dose if no improvement in blood sugars in the next 2-3 days.   Continue routine antenatal care.   Jerilynn Buddle, MD [facility] Faculty Attending, Center for [facility] 04/13/2023 12:36 PM

## 2023-04-13 NOTE — Inpatient Diabetes Management (Addendum)
 Inpatient Diabetes Program Recommendations  Diabetes Treatment Program Recommendations  ADA Standards of Care Diabetes in Pregnancy Target Glucose Ranges:  Fasting: 70 - 95 mg/dL 1 hr postprandial: Less than 140mg /dL (from first bite of meal) 2 hr postprandial: Less than 120 mg/dL (from first bite of meal)     Latest Reference Range & Units 04/12/23 05:37 04/12/23 10:57 04/12/23 16:41 04/12/23 19:37 04/12/23 22:02 04/13/23 05:22  Glucose-Capillary 70 - 99 mg/dL 865 (H) 858 (H) 894 (H) 120 (H) 117 (H) 95   Review of Glycemic Control  Diabetes history: New GDM dx Outpatient Diabetes medications: NA Current orders for Inpatient glycemic control: Metformin  500 mg BID  NOTE: Patient received Betamethasone  12 mg on 12/27 and 12/28. Glucose trends improving. Patient is interested in using FreeStyle Libre3 CGM. Will plan to give patient FreeStyle Libre3 sensors, assist to apply, and provide education on how to use CGM.  Addendum 04/13/23@11 :90- Spoke with patient at bedside. Educated patient on FreeStyle Libre3 CGM regarding application and changing CGM sensor (alternate every 14 days on back of arms), 1 hour warm-up, how to scan sensor to start a new sensor, and how to use app to check glucose.  Encouraged patient to watch YouTube video on how to apply FreeStyle Libre3 sensor.  Patient has been given Freestyle Libre3 sensor samples (3).  Provided educational packet regarding FreeStyle Libre3 CGM.  Patient plans to use iphone FreeStyle Libre3 app to read FreeStyle Libre sensor and was able to download the app and create an account. Patient was able to apply FreeStyle Libre3 sensor to back of left upper arm around 11:20 am.  Discussed that her insurance does not cover the FreeStyle [place] 3 sensor so she has been given 2 additional sensors so she has enough to use until she delivers.  Reviewed some information discussed yesterday regarding GDM. Provided QR code for patient to scan to watch a video about  GDM. Encouraged patient to watch the video and read over handouts on GDM to increase knowledge and be able to manage glucose after discharge.  Patient appreciative of information discussed and verbalized understanding of information and has no questions at this time.  Thanks, Earnie Gainer, RN, MSN, CDCES Diabetes Coordinator Inpatient Diabetes Program [phone] (Team Pager from 8am to 5pm)

## 2023-04-14 LAB — GLUCOSE, CAPILLARY
Glucose-Capillary: 92 mg/dL (ref 70–99)
Glucose-Capillary: 109 mg/dL — ABNORMAL HIGH (ref 70–99)

## 2023-04-14 MED ORDER — LIDOCAINE 5 % EX PTCH
1.0000 | MEDICATED_PATCH | CUTANEOUS | Status: DC
  Administered 2023-04-14: 1 via TRANSDERMAL
  Filled 2023-04-14 (×2): qty 1

## 2023-04-14 MED ORDER — CYCLOBENZAPRINE HCL 10 MG PO TABS
5.0000 mg | ORAL_TABLET | Freq: Three times a day (TID) | ORAL | Status: DC | PRN
  Administered 2023-04-14 – 2023-04-15 (×3): 10 mg via ORAL
  Filled 2023-04-14 (×3): qty 1

## 2023-04-14 MED ORDER — LACTATED RINGERS IV BOLUS
500.0000 mL | Freq: Once | INTRAVENOUS | Status: AC
  Administered 2023-04-14: 500 mL via INTRAVENOUS

## 2023-04-14 NOTE — Progress Notes (Signed)
 FACULTY PRACTICE ANTEPARTUM PROGRESS NOTE  Victoria Cline is a 33 y.o. G3P0020 at [redacted]w[redacted]d who is admitted for bleeding placenta previa.  Estimated Date of Delivery: 07/02/23 Fetal presentation is cephalic.  Length of Stay:  4 Days. Admitted 04/10/2023  Subjective: Pt seen and was doing well.  No acute complaints but reports back pain today that is mostly her lower back but also her upper back. It is not coming and going. She has felt improvement with the adjustments in her medications, heating pad and lidocaine  patch.  She denies recurrence of bleeding. Patient reports normal fetal movement.  She denies uterine contractions, denies bleeding and leaking of fluid per vagina.  Vitals:  Blood pressure 132/71, pulse (!) 112, temperature 98.2 F (36.8 C), temperature source Oral, resp. rate 19, height 5' 3 (1.6 m), weight 73.9 kg, last menstrual period 08/28/2022, SpO2 98%. Physical Examination: CONSTITUTIONAL: Well-developed, well-nourished female in no acute distress.  HENT:  Normocephalic, atraumatic, External right and left ear normal. Oropharynx is clear and moist EYES: Conjunctivae and EOM are normal.  NECK: Normal range of motion, supple, no masses. SKIN: Skin is warm and dry. No rash noted. Not diaphoretic. No erythema. No pallor. NEUROLGIC: Alert and oriented to person, place, and time. Normal reflexes, muscle tone coordination. No cranial nerve deficit noted. PSYCHIATRIC: Normal mood and affect. Normal behavior. Normal judgment and thought content. CARDIOVASCULAR: Normal heart rate noted, regular rhythm RESPIRATORY: Effort and breath sounds normal, no problems with respiration noted MUSCULOSKELETAL: Normal range of motion. No edema and no tenderness. ABDOMEN: Soft, nontender, nondistended, gravid. CERVIX: deferred  Fetal monitoring: FHR: 140 bpm, Variability: moderate, Accelerations: 10x10 Present, Decelerations: Absent  Uterine activity: none   Results for orders placed or  performed during the hospital encounter of 04/10/23 (from the past 48 hours)  Glucose, capillary     Status: Abnormal   Collection Time: 04/12/23  4:41 PM  Result Value Ref Range   Glucose-Capillary 105 (H) 70 - 99 mg/dL    Comment: Glucose reference range applies only to samples taken after fasting for at least 8 hours.  Glucose, capillary     Status: Abnormal   Collection Time: 04/12/23  7:37 PM  Result Value Ref Range   Glucose-Capillary 120 (H) 70 - 99 mg/dL    Comment: Glucose reference range applies only to samples taken after fasting for at least 8 hours.  Glucose, capillary     Status: Abnormal   Collection Time: 04/12/23 10:02 PM  Result Value Ref Range   Glucose-Capillary 117 (H) 70 - 99 mg/dL    Comment: Glucose reference range applies only to samples taken after fasting for at least 8 hours.  Glucose, capillary     Status: None   Collection Time: 04/13/23  5:22 AM  Result Value Ref Range   Glucose-Capillary 95 70 - 99 mg/dL    Comment: Glucose reference range applies only to samples taken after fasting for at least 8 hours.  Glucose, capillary     Status: Abnormal   Collection Time: 04/13/23 10:34 PM  Result Value Ref Range   Glucose-Capillary 133 (H) 70 - 99 mg/dL    Comment: Glucose reference range applies only to samples taken after fasting for at least 8 hours.  Glucose, capillary     Status: None   Collection Time: 04/14/23  8:08 AM  Result Value Ref Range   Glucose-Capillary 92 70 - 99 mg/dL    Comment: Glucose reference range applies only to samples taken after fasting for  at least 8 hours.    I have reviewed the patient's current medications.  ASSESSMENT: Principal Problem:   Placenta previa antepartum in third trimester Active Problems:   Placenta previa antepartum in second trimester   PLAN: #Previa:  Continue inpatient hospital stay total of 5-7 days.  Continue daily testing. NST reactive. Reviewed possible d/c home on 1/2 if no bleeding and back pain  mostly resolves/improves. She works at [employer]. 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 sitting.   #A2GDM: FBS today was 92, likely still some residual effect from BMZ but continues to trend downward. Appreciate inpatient diabetic coordinator assistance. Will continue metformin  at current dosage.  #Continue routine antenatal care.  Vina Solian, MD [facility] Faculty Attending, Center for [employer] [facility] 04/14/2023 2:34 PM

## 2023-04-15 LAB — GLUCOSE, CAPILLARY: Glucose-Capillary: 105 mg/dL — ABNORMAL HIGH (ref 70–99)

## 2023-04-15 MED ORDER — METFORMIN HCL 500 MG PO TABS: 500.0000 mg | ORAL_TABLET | Freq: Two times a day (BID) | ORAL | 2 refills | Status: DC

## 2023-04-15 MED ORDER — CYCLOBENZAPRINE HCL 10 MG PO TABS: 10.0000 mg | ORAL_TABLET | Freq: Three times a day (TID) | ORAL | 1 refills | Status: DC | PRN

## 2023-04-15 NOTE — Discharge Summary (Signed)
 Antenatal Physician Discharge Summary  Patient ID: Victoria Cline MRN: [id number] DOB/AGE: 12-16-1990 32 y.o.  Admit date: 04/10/2023 Discharge date: 04/15/2023  Admission Diagnoses:  Discharge Diagnoses: Principal Problem:   Placenta previa antepartum in third trimester Active Problems:   Placenta previa antepartum in second trimester    Prenatal Procedures: none  Consults: Neonatology  Hospital Course:  Victoria Cline is a 33 y.o. [id number] with IUP at [redacted]w[redacted]d admitted for .  She was admitted with vaginal bleeding. No leaking of fluid.  She was initially started on magnesium  sulfate for neuroprotection and also received betamethasone  x 2 doses.  She received metformin  to try to help CBG control after BMZ.  She was observed, fetal heart rate monitoring remained reassuring, and she had no further bleeding after 5 days.   She was deemed stable for discharge to home with outpatient follow up.  Discharge Exam: Temp:  [98 F (36.7 C)-98.4 F (36.9 C)] 98 F (36.7 C) (01/01 2350) Pulse Rate:  [88-117] 117 (01/01 2350) Resp:  [17-19] 18 (01/01 2350) BP: (118-137)/(58-74) 137/72 (01/01 2350) SpO2:  [98 %-100 %] 100 % (01/01 2350) Physical Examination: CONSTITUTIONAL: Well-developed, well-nourished female in no acute distress.  HENT:  Normocephalic, atraumatic, External right and left ear normal.  EYES: Conjunctivae and EOM are normal. Pupils are equal, round, and reactive to light. No scleral icterus.  NECK: Normal range of motion, supple, no masses SKIN: Skin is warm and dry. No rash noted. Not diaphoretic. No erythema. No pallor. NEUROLOGIC: Alert and oriented to person, place, and time. Normal reflexes, muscle tone coordination. No cranial nerve deficit noted. PSYCHIATRIC: Normal mood and affect. Normal behavior. Normal judgment and thought content. CARDIOVASCULAR: Normal heart rate noted, regular rhythm RESPIRATORY: Effort and breath sounds normal, no problems with  respiration noted MUSCULOSKELETAL: Normal range of motion. No edema and no tenderness. 2+ distal pulses. ABDOMEN: Soft, nontender, nondistended, gravid. CERVIX:    Fetal monitoring: FHR: 150 bpm, Variability: moderate, Accelerations: Present, Decelerations: Absent  Uterine activity: quiet   Significant Diagnostic Studies:  Results for orders placed or performed during the hospital encounter of 04/10/23 (from the past week)  CBC   Collection Time: 04/10/23  4:20 AM  Result Value Ref Range   WBC 11.6 (H) 4.0 - 10.5 K/uL   RBC 4.37 3.87 - 5.11 MIL/uL   Hemoglobin 11.3 (L) 12.0 - 15.0 g/dL   HCT 65.4 (L) 63.9 - 53.9 %   MCV 78.9 (L) 80.0 - 100.0 fL   MCH 25.9 (L) 26.0 - 34.0 pg   MCHC 32.8 30.0 - 36.0 g/dL   RDW 84.8 88.4 - 84.4 %   Platelets 301 150 - 400 K/uL   nRBC 0.2 0.0 - 0.2 %  RPR   Collection Time: 04/10/23  4:20 AM  Result Value Ref Range   RPR Ser Ql NON REACTIVE NON REACTIVE  Type and screen   Collection Time: 04/10/23  4:20 AM  Result Value Ref Range   ABO/RH(D) B POS    Antibody Screen NEG    Sample Expiration      04/13/2023,2359 Performed at [facility], [address] [place], [place] [address]   Glucose, capillary   Collection Time: 04/10/23  3:40 PM  Result Value Ref Range   Glucose-Capillary 208 (H) 70 - 99 mg/dL  Glucose, capillary   Collection Time: 04/10/23  9:48 PM  Result Value Ref Range   Glucose-Capillary 179 (H) 70 - 99 mg/dL  Glucose, capillary   Collection  Time: 04/11/23  5:38 AM  Result Value Ref Range   Glucose-Capillary 134 (H) 70 - 99 mg/dL  Glucose, capillary   Collection Time: 04/11/23  7:32 AM  Result Value Ref Range   Glucose-Capillary 196 (H) 70 - 99 mg/dL  Glucose, capillary   Collection Time: 04/11/23  3:24 PM  Result Value Ref Range   Glucose-Capillary 161 (H) 70 - 99 mg/dL  Glucose, capillary   Collection Time: 04/11/23 10:07 PM  Result Value Ref Range   Glucose-Capillary 144 (H) 70 - 99 mg/dL  Glucose,  capillary   Collection Time: 04/12/23  5:37 AM  Result Value Ref Range   Glucose-Capillary 134 (H) 70 - 99 mg/dL  Glucose, capillary   Collection Time: 04/12/23 10:57 AM  Result Value Ref Range   Glucose-Capillary 141 (H) 70 - 99 mg/dL  Glucose, capillary   Collection Time: 04/12/23  4:41 PM  Result Value Ref Range   Glucose-Capillary 105 (H) 70 - 99 mg/dL  Glucose, capillary   Collection Time: 04/12/23  7:37 PM  Result Value Ref Range   Glucose-Capillary 120 (H) 70 - 99 mg/dL  Glucose, capillary   Collection Time: 04/12/23 10:02 PM  Result Value Ref Range   Glucose-Capillary 117 (H) 70 - 99 mg/dL  Glucose, capillary   Collection Time: 04/13/23  5:22 AM  Result Value Ref Range   Glucose-Capillary 95 70 - 99 mg/dL  Glucose, capillary   Collection Time: 04/13/23 10:34 PM  Result Value Ref Range   Glucose-Capillary 133 (H) 70 - 99 mg/dL  Glucose, capillary   Collection Time: 04/14/23  8:08 AM  Result Value Ref Range   Glucose-Capillary 92 70 - 99 mg/dL  Glucose, capillary   Collection Time: 04/14/23  8:09 PM  Result Value Ref Range   Glucose-Capillary 109 (H) 70 - 99 mg/dL   Comment 1 Notify RN    Comment 2 Document in Chart   Glucose, capillary   Collection Time: 04/15/23  5:53 AM  Result Value Ref Range   Glucose-Capillary 105 (H) 70 - 99 mg/dL   Comment 1 Notify RN    Comment 2 Document in Chart   Results for orders placed or performed in visit on 04/08/23 (from the past week)  Glucose Tolerance, 2 Hours w/1 Hour   Collection Time: 04/08/23  9:47 AM  Result Value Ref Range   Glucose, Fasting 116 (H) 70 - 91 mg/dL   Glucose, 1 hour 799 (H) 70 - 179 mg/dL   Glucose, 2 hour 787 (H) 70 - 152 mg/dL  CBC   Collection Time: 04/08/23  9:47 AM  Result Value Ref Range   WBC 13.4 (H) 3.4 - 10.8 x10E3/uL   RBC 4.57 3.77 - 5.28 x10E6/uL   Hemoglobin 11.5 11.1 - 15.9 g/dL   Hematocrit 62.9 65.9 - 46.6 %   MCV 81 79 - 97 fL   MCH 25.2 (L) 26.6 - 33.0 pg   MCHC 31.1 (L)  31.5 - 35.7 g/dL   RDW 85.1 88.2 - 84.5 %   Platelets 323 150 - 450 x10E3/uL  RPR   Collection Time: 04/08/23  9:47 AM  Result Value Ref Range   RPR Ser Ql Non Reactive Non Reactive  HIV Antibody (routine testing w rflx)   Collection Time: 04/08/23  9:47 AM  Result Value Ref Range   HIV Screen 4th Generation wRfx Non Reactive Non Reactive   US  MFM OB Limited Result Date: 04/10/2023 ----------------------------------------------------------------------  OBSTETRICS REPORT                        (  Signed Final 04/10/2023 09:20 am) ---------------------------------------------------------------------- Patient Info  ID #:       [id number]                          D.O.B.:  08-24-1990 (32 yrs)(F)  Name:       Victoria Cline            Visit Date: 04/10/2023 04:45 am ---------------------------------------------------------------------- Performed By  Attending:        Steffan Keys MD         Ref. Address:      [address]                                                              [address]  Performed By:     Alyssa Keatts          Secondary Phy.:    [facility] MAU/Triage                    RDMS  Referred By:      [facility]       [facility] ---------------------------------------------------------------------- Orders  #  Description                           Code        Ordered By  1  US  MFM OB LIMITED                     23184.98    OLAM BETHENE FUELLING ----------------------------------------------------------------------  #  Order #                     Accession #                Episode #  1  [id number]                   [id number]                 [id number] ---------------------------------------------------------------------- Indications  Placenta previa with hemorrhage, third          O44.13  trimester  Vaginal bleeding in pregnancy, third trimester   O46.93  [redacted] weeks gestation of pregnancy                 Z3A.28 ---------------------------------------------------------------------- Fetal Evaluation  Num Of Fetuses:          1  Fetal Heart Rate(bpm):   158  Cardiac Activity:        Observed  Presentation:            Cephalic  Placenta:                Posterior previa  P. Cord Insertion:       Visualized  Amniotic Fluid  AFI FV:      Within normal limits  AFI Sum(cm)     %Tile       Largest Pocket(cm)  19.8            78          6.9  RUQ(cm)       RLQ(cm)       LUQ(cm)        LLQ(cm)  6.9           4.3           3.7            4.9  Comment:    No placental abruption identified. stomach, bladder, and              diaphragm seen. ---------------------------------------------------------------------- OB History  Gravidity:    3         Term:   0         SAB:   2 ---------------------------------------------------------------------- Gestational Age  LMP:           32w 1d        Date:  08/28/22                  EDD:   06/04/23  Best:          28w 1d     Det. By:  Early Ultrasound         EDD:   07/02/23                                      (11/11/22) ---------------------------------------------------------------------- Cervix Uterus Adnexa  Cervix  Length:            3.8  cm.  Normal appearance by limited transabdominal scan  Uterus  No abnormality visualized.  Right Ovary  Within normal limits.  Left Ovary  Within normal limits..  Adnexa  No abnormality visualized ---------------------------------------------------------------------- Comments  This patient presented to the MAU due to vaginal bleeding.  She has a known placenta previa.  A limited ultrasound performed today continues to show a  posterior placenta previa.  The fetus is in the vertex presentation.  There was normal amniotic fluid noted.  Due to vaginal bleeding and placenta previa, she is being  admitted for observation.  She will receive magnesium  sulfate for fetal neuroprotection  and a complete course  of antenatal corticosteroids. ----------------------------------------------------------------------                   Steffan Keys, MD Electronically Signed Final Report   04/10/2023 09:20 am ----------------------------------------------------------------------   US  MFM OB LIMITED Result Date: 03/25/2023 ----------------------------------------------------------------------  OBSTETRICS REPORT                       (Signed Final 03/25/2023 06:06 pm) ---------------------------------------------------------------------- Patient Info  ID #:       [id number]                          D.O.B.:  [birth year] (32 yrs)(F)  Name:       Victoria Cline            Visit Date: 03/25/2023 04:11 pm ---------------------------------------------------------------------- Performed By  Attending:        Delora Smaller DO       Ref. Address:     [address]                                                             [address]  Performed By:     Powell Breen          Secondary Phy.:   [facility] MAU/Triage                    RDMS  Referred By:      [facility]       [facility] ---------------------------------------------------------------------- Orders  #  Description                           Code        Ordered By  1  US  MFM OB LIMITED                     23184.98    MARDY SHROPSHIRE ----------------------------------------------------------------------  #  Order #                     Accession #                Episode #  1  [id number]                   [id number]                 [id number] ---------------------------------------------------------------------- Indications  Vaginal bleeding in pregnancy, second          O46.92  trimester  Placenta previa specified as without           O44.02  hemorrhage, second trimester  Genetic carrier-Alpha thalassemia trait (a-/a-)Z14.8  LR NIPS, pos carrier screen  [redacted] weeks gestation of pregnancy                 Z3A.25 ---------------------------------------------------------------------- Fetal Evaluation  Num Of Fetuses:         1  Fetal Heart Rate(bpm):  142  Cardiac Activity:       Observed  Presentation:           Transverse, head to maternal right  Placenta:               Posterior previa  P. Cord Insertion:      Previously seen  Amniotic Fluid  AFI FV:      Within normal limits  Largest Pocket(cm)                              4.3  Comment:    No placental abruption identified. ---------------------------------------------------------------------- OB History  Gravidity:    3         Term:   0         SAB:   2 ---------------------------------------------------------------------- Gestational Age  LMP:           29w 6d        Date:  08/28/22                  EDD:   06/04/23  Best:          lynita 6d     Det. By:  Early Ultrasound         EDD:   07/02/23                                      (11/11/22) ---------------------------------------------------------------------- Anatomy  Cranium:               Appears normal         Diaphragm:              Appears normal  Ventricles:            Appears normal         Stomach:                Appears normal, left                                                                        sided  Thoracic:              Appears normal         Kidneys:                Appear normal  LVOT:                  Appears normal         Bladder:                Appears normal ---------------------------------------------------------------------- Cervix Uterus Adnexa  Cervix  Length:              4  cm.  Normal appearance by transabdominal scan  Uterus  No abnormality visualized.  Right Ovary  Not visualized.  Left Ovary  Not visualized.  Cul De Sac  No free fluid seen.  Adnexa  No abnormality visualized ---------------------------------------------------------------------- Comments  Hospital Ultrasound  The patient presented to the MAU for vaginal spotting.  Sonographic  findings  Single intrauterine pregnancy at 25w 6d  Fetal cardiac activity: Observed and appears normal.  Presentation: Transverse, head to maternal right.  Limited fetal anatomy appears normal.  Amniotic fluid volume: Within normal limits. MVP: 4.3 cm.  Placenta: Posterior previa.  Recommendations  - Continue clinical management per OB provider.  - If there is a clinically significant bleeding episode then  admission should be considered.  This was a limited ultrasound with a  remote read. If an official  MFM consult is requested for any reason please call/place an  order in Epic. ----------------------------------------------------------------------                   Delora Smaller, DO Electronically Signed Final Report   03/25/2023 06:06 pm ----------------------------------------------------------------------    Future Appointments  Date Time Provider Department Center  04/21/2023  8:35 AM Fredirick Glenys RAMAN, MD [facility]  04/22/2023  7:30 AM [facility] US1 [facility]  05/05/2023  2:30 PM Fredirick Glenys RAMAN, MD [facility]  05/19/2023 11:15 AM Herchel, Gloris LABOR, MD [facility]  06/02/2023 11:15 AM Fredirick Glenys RAMAN, MD [facility]  06/09/2023 11:15 AM Fredirick Glenys RAMAN, MD [facility]    Discharge Condition: Stable  Discharge disposition: 01-Home or Self Care       Discharge Instructions     Discharge activity:   Complete by: As directed    Discharge activity:  Up to eat   Complete by: As directed    Discharge diet:   Complete by: As directed    OB diabetic diet   Do not have sex or do anything that might make you have an orgasm   Complete by: As directed    Fetal Kick Count:  Lie on our left side for one hour after a meal, and count the number of times your baby kicks.  If it is less than 5 times, get up, move around and drink some juice.  Repeat the test 30 minutes later.  If it is still less than 5 kicks in an hour, notify your doctor.   Complete by:  As directed       Allergies as of 04/15/2023   No Known Allergies      Medication List     STOP taking these medications    fluconazole  150 MG tablet Commonly known as: Diflucan        TAKE these medications    acetaminophen  500 MG tablet Commonly known as: TYLENOL  Take 1 tablet (500 mg total) by mouth every 6 (six) hours as needed.   albuterol  108 (90 Base) MCG/ACT inhaler Commonly known as: VENTOLIN  HFA Inhale 2 puffs into the lungs every 6 (six) hours as needed for wheezing or shortness of breath.   aspirin  EC 81 MG tablet Take 1 tablet (81 mg total) by mouth at bedtime. Start taking when you are [redacted] weeks pregnant for rest of pregnancy for prevention of preeclampsia   budesonide -formoterol  80-4.5 MCG/ACT inhaler Commonly known as: Symbicort  Inhale 2 puffs into the lungs in the morning and at bedtime.   cyclobenzaprine  5 MG tablet Commonly known as: FLEXERIL  Take 1 tablet (5 mg total) by mouth 3 (three) times daily as needed for muscle spasms.   docusate sodium  100 MG capsule Commonly known as: COLACE Take 1 capsule (100 mg total) by mouth 2 (two) times daily as needed.   metFORMIN  500 MG tablet Commonly known as: GLUCOPHAGE  Take 1 tablet (500 mg total) by mouth 2 (two) times daily with a meal.   Prenatal Vitamin 27-0.8 MG Tabs Take 1 tablet by mouth daily.        Follow-up Information     [facility] for [facility] Follow up.   Specialty: Obstetrics and Gynecology Why: keep next scheduled appointment Contact information: [address] [place]  [phone]                Total discharge time: 52  minutes   Signed: Glenys GORMAN Birk M.D. 04/15/2023, 8:00 AM

## 2023-04-16 ENCOUNTER — Other Ambulatory Visit: Payer: Self-pay

## 2023-04-16 ENCOUNTER — Encounter (HOSPITAL_COMMUNITY): Payer: Self-pay | Admitting: Obstetrics & Gynecology

## 2023-04-16 ENCOUNTER — Inpatient Hospital Stay (HOSPITAL_COMMUNITY)
Admission: AD | Admit: 2023-04-16 | Discharge: 2023-04-26 | DRG: 787 | Disposition: A | Discharge status: Home / Self Care | Payer: No Typology Code available for payment source | Attending: Obstetrics & Gynecology | Admitting: Obstetrics & Gynecology

## 2023-04-16 DIAGNOSIS — Z3A30 30 weeks gestation of pregnancy: Secondary | ICD-10-CM | POA: Diagnosis not present

## 2023-04-16 DIAGNOSIS — O24424 Gestational diabetes mellitus in childbirth, insulin controlled: Secondary | ICD-10-CM | POA: Diagnosis not present

## 2023-04-16 DIAGNOSIS — O4413 Placenta previa with hemorrhage, third trimester: Secondary | ICD-10-CM | POA: Diagnosis present

## 2023-04-16 DIAGNOSIS — O9962 Diseases of the digestive system complicating childbirth: Secondary | ICD-10-CM | POA: Diagnosis present

## 2023-04-16 DIAGNOSIS — J45909 Unspecified asthma, uncomplicated: Secondary | ICD-10-CM | POA: Diagnosis present

## 2023-04-16 DIAGNOSIS — O99334 Smoking (tobacco) complicating childbirth: Secondary | ICD-10-CM | POA: Diagnosis present

## 2023-04-16 DIAGNOSIS — Z3480 Encounter for supervision of other normal pregnancy, unspecified trimester: Secondary | ICD-10-CM

## 2023-04-16 DIAGNOSIS — Z3A29 29 weeks gestation of pregnancy: Secondary | ICD-10-CM

## 2023-04-16 DIAGNOSIS — O24425 Gestational diabetes mellitus in childbirth, controlled by oral hypoglycemic drugs: Secondary | ICD-10-CM | POA: Diagnosis present

## 2023-04-16 DIAGNOSIS — O24419 Gestational diabetes mellitus in pregnancy, unspecified control: Secondary | ICD-10-CM | POA: Diagnosis present

## 2023-04-16 DIAGNOSIS — O99354 Diseases of the nervous system complicating childbirth: Secondary | ICD-10-CM | POA: Diagnosis present

## 2023-04-16 DIAGNOSIS — O4403 Placenta previa specified as without hemorrhage, third trimester: Principal | ICD-10-CM

## 2023-04-16 DIAGNOSIS — O4420 Partial placenta previa NOS or without hemorrhage, unspecified trimester: Secondary | ICD-10-CM | POA: Diagnosis not present

## 2023-04-16 DIAGNOSIS — O9952 Diseases of the respiratory system complicating childbirth: Secondary | ICD-10-CM | POA: Diagnosis present

## 2023-04-16 DIAGNOSIS — K5909 Other constipation: Secondary | ICD-10-CM | POA: Diagnosis not present

## 2023-04-16 DIAGNOSIS — O9081 Anemia of the puerperium: Secondary | ICD-10-CM | POA: Diagnosis not present

## 2023-04-16 DIAGNOSIS — F17210 Nicotine dependence, cigarettes, uncomplicated: Secondary | ICD-10-CM | POA: Diagnosis present

## 2023-04-16 DIAGNOSIS — Z148 Genetic carrier of other disease: Secondary | ICD-10-CM | POA: Diagnosis not present

## 2023-04-16 DIAGNOSIS — G43909 Migraine, unspecified, not intractable, without status migrainosus: Secondary | ICD-10-CM | POA: Diagnosis present

## 2023-04-16 DIAGNOSIS — K219 Gastro-esophageal reflux disease without esophagitis: Secondary | ICD-10-CM | POA: Diagnosis present

## 2023-04-16 DIAGNOSIS — O321XX0 Maternal care for breech presentation, not applicable or unspecified: Secondary | ICD-10-CM | POA: Diagnosis present

## 2023-04-16 DIAGNOSIS — O24414 Gestational diabetes mellitus in pregnancy, insulin controlled: Secondary | ICD-10-CM | POA: Diagnosis not present

## 2023-04-16 DIAGNOSIS — O4693 Antepartum hemorrhage, unspecified, third trimester: Secondary | ICD-10-CM | POA: Diagnosis present

## 2023-04-16 DIAGNOSIS — O4402 Placenta previa specified as without hemorrhage, second trimester: Secondary | ICD-10-CM

## 2023-04-16 DIAGNOSIS — Z98891 History of uterine scar from previous surgery: Secondary | ICD-10-CM

## 2023-04-16 DIAGNOSIS — D62 Acute posthemorrhagic anemia: Secondary | ICD-10-CM | POA: Diagnosis not present

## 2023-04-16 DIAGNOSIS — O4593 Premature separation of placenta, unspecified, third trimester: Secondary | ICD-10-CM | POA: Diagnosis not present

## 2023-04-16 DIAGNOSIS — O24415 Gestational diabetes mellitus in pregnancy, controlled by oral hypoglycemic drugs: Secondary | ICD-10-CM | POA: Diagnosis not present

## 2023-04-16 HISTORY — DX: Cannabis use, unspecified, in remission: F12.91

## 2023-04-16 LAB — CBC
HCT: 34.5 % — ABNORMAL LOW (ref 36.0–46.0)
Hemoglobin: 10.9 g/dL — ABNORMAL LOW (ref 12.0–15.0)
MCH: 25.2 pg — ABNORMAL LOW (ref 26.0–34.0)
MCHC: 31.6 g/dL (ref 30.0–36.0)
MCV: 79.9 fL — ABNORMAL LOW (ref 80.0–100.0)
Platelets: 325 10*3/uL (ref 150–400)
RBC: 4.32 MIL/uL (ref 3.87–5.11)
RDW: 15.3 % (ref 11.5–15.5)
WBC: 13 10*3/uL — ABNORMAL HIGH (ref 4.0–10.5)
nRBC: 0.4 % — ABNORMAL HIGH (ref 0.0–0.2)

## 2023-04-16 LAB — TYPE AND SCREEN
ABO/RH(D): B POS
Antibody Screen: NEGATIVE

## 2023-04-16 MED ORDER — METFORMIN HCL 500 MG PO TABS
500.0000 mg | ORAL_TABLET | Freq: Two times a day (BID) | ORAL | Status: DC
  Administered 2023-04-17 – 2023-04-18 (×3): 500 mg via ORAL
  Filled 2023-04-16 (×3): qty 1

## 2023-04-16 MED ORDER — DOCUSATE SODIUM 100 MG PO CAPS
100.0000 mg | ORAL_CAPSULE | Freq: Two times a day (BID) | ORAL | Status: DC | PRN
  Administered 2023-04-22: 100 mg via ORAL
  Filled 2023-04-16: qty 1

## 2023-04-16 MED ORDER — PRENATAL MULTIVITAMIN CH
1.0000 | ORAL_TABLET | Freq: Every day | ORAL | Status: DC
  Administered 2023-04-17 – 2023-04-22 (×6): 1 via ORAL
  Filled 2023-04-16 (×6): qty 1

## 2023-04-16 MED ORDER — INSULIN ASPART 100 UNIT/ML IJ SOLN
0.0000 [IU] | Freq: Three times a day (TID) | INTRAMUSCULAR | Status: DC
  Administered 2023-04-16: 3 [IU] via SUBCUTANEOUS
  Administered 2023-04-17 (×2): 2 [IU] via SUBCUTANEOUS

## 2023-04-16 MED ORDER — CALCIUM CARBONATE ANTACID 500 MG PO CHEW
2.0000 | CHEWABLE_TABLET | ORAL | Status: DC | PRN
  Administered 2023-04-16 – 2023-04-22 (×4): 400 mg via ORAL
  Filled 2023-04-16 (×5): qty 2

## 2023-04-16 MED ORDER — SODIUM CHLORIDE 0.9% FLUSH
3.0000 mL | Freq: Two times a day (BID) | INTRAVENOUS | Status: DC
  Administered 2023-04-16 – 2023-04-22 (×10): 3 mL via INTRAVENOUS

## 2023-04-16 MED ORDER — SODIUM CHLORIDE 0.9% FLUSH: 3.0000 mL | INTRAVENOUS | Status: DC | PRN

## 2023-04-16 MED ORDER — MOMETASONE FURO-FORMOTEROL FUM 100-5 MCG/ACT IN AERO
2.0000 | INHALATION_SPRAY | Freq: Two times a day (BID) | RESPIRATORY_TRACT | Status: DC
  Administered 2023-04-16 – 2023-04-26 (×18): 2 via RESPIRATORY_TRACT
  Filled 2023-04-16: qty 8.8

## 2023-04-16 MED ORDER — ASPIRIN 81 MG PO TBEC
81.0000 mg | DELAYED_RELEASE_TABLET | Freq: Every day | ORAL | Status: DC
  Administered 2023-04-17 – 2023-04-22 (×6): 81 mg via ORAL
  Filled 2023-04-16 (×6): qty 1

## 2023-04-16 MED ORDER — SODIUM CHLORIDE 0.9 % IV SOLN: 250.0000 mL | INTRAVENOUS | Status: AC | PRN

## 2023-04-16 MED ORDER — CYCLOBENZAPRINE HCL 10 MG PO TABS
10.0000 mg | ORAL_TABLET | Freq: Three times a day (TID) | ORAL | Status: DC | PRN
  Administered 2023-04-17 – 2023-04-23 (×14): 10 mg via ORAL
  Filled 2023-04-16 (×14): qty 1

## 2023-04-16 MED ORDER — DOCUSATE SODIUM 100 MG PO CAPS: 100.0000 mg | ORAL_CAPSULE | Freq: Every day | ORAL | Status: DC

## 2023-04-16 MED ORDER — ZOLPIDEM TARTRATE 5 MG PO TABS
5.0000 mg | ORAL_TABLET | Freq: Every evening | ORAL | Status: DC | PRN
Start: 2023-04-16 — End: 2023-04-23
  Administered 2023-04-18 – 2023-04-22 (×5): 5 mg via ORAL
  Filled 2023-04-16 (×5): qty 1

## 2023-04-16 MED ORDER — ALBUTEROL SULFATE (2.5 MG/3ML) 0.083% IN NEBU
2.5000 mg | INHALATION_SOLUTION | Freq: Four times a day (QID) | RESPIRATORY_TRACT | Status: DC | PRN
  Filled 2023-04-16: qty 3

## 2023-04-16 MED ORDER — ACETAMINOPHEN 325 MG PO TABS
650.0000 mg | ORAL_TABLET | ORAL | Status: DC | PRN
  Administered 2023-04-16 – 2023-04-22 (×10): 650 mg via ORAL
  Filled 2023-04-16 (×10): qty 2

## 2023-04-16 NOTE — Plan of Care (Signed)
   Problem: Education: Goal: Knowledge of General Education information will improve Description: Including pain rating scale, medication(s)/side effects and non-pharmacologic comfort measures Outcome: Completed/Met
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
## 2023-04-16 NOTE — MAU Note (Signed)
.  Victoria Cline is a 33 y.o. at [redacted]w[redacted]d here in MAU reporting: vaginal bleeding that started 10-20 minutes ago. Went to the bathroom and after she voided and then bright red blood continued to come out. Denies any abnormal discharge. Is having lower abdominal pressure 7/10 that started a few minutes after, pain is constant. Also has HA 7/10. Denies intercourse or trauma. Less FM since then. Does have known previa.  Previously admitted for vaginal bleeding and received Magnesium  and BMZ. Discharged on 1/2. Bleeding had stopped at that point.   Onset of complaint: today Pain score: 7/10 Vitals:   04/16/23 1648  BP: 131/72  Pulse: (!) 107  Resp: 20  SpO2: 99%     FHT:165 Lab orders placed from triage: N/A

## 2023-04-17 DIAGNOSIS — Z3A29 29 weeks gestation of pregnancy: Secondary | ICD-10-CM

## 2023-04-17 DIAGNOSIS — O24415 Gestational diabetes mellitus in pregnancy, controlled by oral hypoglycemic drugs: Secondary | ICD-10-CM

## 2023-04-17 DIAGNOSIS — O4403 Placenta previa specified as without hemorrhage, third trimester: Secondary | ICD-10-CM

## 2023-04-17 LAB — GLUCOSE, CAPILLARY
Glucose-Capillary: 92 mg/dL (ref 70–99)
Glucose-Capillary: 86 mg/dL (ref 70–99)
Glucose-Capillary: 110 mg/dL — ABNORMAL HIGH (ref 70–99)

## 2023-04-17 MED ORDER — LIDOCAINE 5 % EX PTCH
1.0000 | MEDICATED_PATCH | CUTANEOUS | Status: DC
Start: 2023-04-17 — End: 2023-04-21
  Administered 2023-04-17 – 2023-04-21 (×5): 1 via TRANSDERMAL
  Filled 2023-04-17 (×6): qty 1

## 2023-04-17 NOTE — Inpatient Diabetes Management (Addendum)
 ADA Standards of Care 2023 Diabetes in Pregnancy Target Glucose Ranges:  Fasting: 70 - 95 mg/dL 1 hr postprandial:  889 - 140mg /dL (from first bite of meal) 2 hr postprandial:  100 - 120 mg/dL (from first bit of meal)    Latest Reference Range & Units 04/17/23 09:21  Glucose-Capillary 70 - 99 mg/dL 92     Patient [redacted]w[redacted]d admitted with vaginal bleeding Received Betamethasone  12 mg on 12/27 and 12/28 Was just admitted 12/28 thru 01/02 Pregnancy complicated by Posterior Previa   Diabetes history: New GDM diagnosis 04/10/2023  Outpatient Diabetes meds: Metformin  500 mg BID                Freestyle Libre 3 CGM  Current orders: Metformin  500 mg BID     Novolog  0-16 units TID after meals     CBG 92 this AM  Starting Metformin  and Novolog  Correction this AM    Addendum 2:15pm--I was notified via Secure Chat that pt would like to talk with the Diabetes Coordinator.  I am the only Diabetes Coordinator covering the entire health system this weekend and asked RN if I could call pt as I am currently at [facility] seeing pts.  RN stated it was OK to call pt's cell phone.  When I called, husband answered and told me pt was sleeping.  I told husband I will call pt back tomorrow (Sunday) AM and asked him to let her know I will try to call again tomorrow--Husband stated he would let pt know.  I also alerted RN to this via [place].  Of note, Diabetes Coordinator spoke with pt twice during last admission.     --Will follow patient during hospitalization--  Adina Rudolpho Arrow RN, MSN, CDCES Diabetes Coordinator Inpatient Glycemic Control Team Team Pager: [phone] (8a-5p)
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
## 2023-04-17 NOTE — Plan of Care (Signed)
  Problem: Health Behavior/Discharge Planning: Goal: Ability to manage health-related needs will improve Outcome: Progressing   Problem: Clinical Measurements: Goal: Ability to maintain clinical measurements within normal limits will improve Outcome: Progressing Goal: Will remain free from infection Outcome: Progressing Goal: Diagnostic test results will improve Outcome: Progressing Goal: Respiratory complications will improve Outcome: Progressing Goal: Cardiovascular complication will be avoided Outcome: Progressing   Problem: Activity: Goal: Risk for activity intolerance will decrease Outcome: Progressing   Problem: Nutrition: Goal: Adequate nutrition will be maintained Outcome: Progressing   Problem: Coping: Goal: Level of anxiety will decrease Outcome: Progressing   Problem: Elimination: Goal: Will not experience complications related to bowel motility Outcome: Progressing Goal: Will not experience complications related to urinary retention Outcome: Progressing   Problem: Pain Management: Goal: General experience of comfort will improve Outcome: Progressing   Problem: Safety: Goal: Ability to remain free from injury will improve Outcome: Progressing   Problem: Skin Integrity: Goal: Risk for impaired skin integrity will decrease Outcome: Progressing   Problem: Education: Goal: Knowledge of disease or condition will improve Outcome: Progressing Goal: Knowledge of the prescribed therapeutic regimen will improve Outcome: Progressing Goal: Individualized Educational Video(s) Outcome: Progressing   Problem: Clinical Measurements: Goal: Complications related to the disease process, condition or treatment will be avoided or minimized Outcome: Progressing   Problem: Education: Goal: Ability to describe self-care measures that may prevent or decrease complications (Diabetes Survival Skills Education) will improve Outcome: Progressing Goal: Individualized  Educational Video(s) Outcome: Progressing   Problem: Coping: Goal: Ability to adjust to condition or change in health will improve Outcome: Progressing   Problem: Fluid Volume: Goal: Ability to maintain a balanced intake and output will improve Outcome: Progressing   Problem: Health Behavior/Discharge Planning: Goal: Ability to identify and utilize available resources and services will improve Outcome: Progressing Goal: Ability to manage health-related needs will improve Outcome: Progressing   Problem: Metabolic: Goal: Ability to maintain appropriate glucose levels will improve Outcome: Progressing   Problem: Nutritional: Goal: Maintenance of adequate nutrition will improve Outcome: Progressing Goal: Progress toward achieving an optimal weight will improve Outcome: Progressing   Problem: Skin Integrity: Goal: Risk for impaired skin integrity will decrease Outcome: Progressing   Problem: Tissue Perfusion: Goal: Adequacy of tissue perfusion will improve Outcome: Progressing

## 2023-04-18 ENCOUNTER — Inpatient Hospital Stay (HOSPITAL_COMMUNITY): Payer: No Typology Code available for payment source

## 2023-04-18 DIAGNOSIS — O4413 Placenta previa with hemorrhage, third trimester: Secondary | ICD-10-CM

## 2023-04-18 DIAGNOSIS — Z3A29 29 weeks gestation of pregnancy: Secondary | ICD-10-CM | POA: Diagnosis not present

## 2023-04-18 LAB — IRON AND TIBC
Iron: 83 ug/dL (ref 28–170)
Saturation Ratios: 14 % (ref 10.4–31.8)
TIBC: 585 ug/dL — ABNORMAL HIGH (ref 250–450)
UIBC: 502 ug/dL

## 2023-04-18 LAB — GLUCOSE, CAPILLARY
Glucose-Capillary: 117 mg/dL — ABNORMAL HIGH (ref 70–99)
Glucose-Capillary: 141 mg/dL — ABNORMAL HIGH (ref 70–99)
Glucose-Capillary: 136 mg/dL — ABNORMAL HIGH (ref 70–99)
Glucose-Capillary: 129 mg/dL — ABNORMAL HIGH (ref 70–99)

## 2023-04-18 LAB — FOLATE: Folate: >40 ng/mL (ref >5.9)

## 2023-04-18 LAB — VITAMIN B12: Vitamin B-12: 266 pg/mL (ref 180–914)

## 2023-04-18 LAB — FERRITIN: Ferritin: 11 ng/mL (ref 11–307)

## 2023-04-18 MED ORDER — INSULIN ASPART 100 UNIT/ML IJ SOLN
0.0000 [IU] | Freq: Three times a day (TID) | INTRAMUSCULAR | Status: DC
  Administered 2023-04-18 – 2023-04-21 (×2): 2 [IU] via SUBCUTANEOUS
  Administered 2023-04-22: 4 [IU] via SUBCUTANEOUS

## 2023-04-18 MED ORDER — INSULIN ASPART 100 UNIT/ML IJ SOLN
0.0000 [IU] | Freq: Every day | INTRAMUSCULAR | Status: DC
  Administered 2023-04-20: 2 [IU] via SUBCUTANEOUS

## 2023-04-18 NOTE — Plan of Care (Signed)
  Problem: Health Behavior/Discharge Planning: Goal: Ability to manage health-related needs will improve Outcome: Progressing   Problem: Clinical Measurements: Goal: Ability to maintain clinical measurements within normal limits will improve Outcome: Progressing Goal: Will remain free from infection Outcome: Progressing Goal: Diagnostic test results will improve Outcome: Progressing Goal: Respiratory complications will improve Outcome: Progressing Goal: Cardiovascular complication will be avoided Outcome: Progressing   Problem: Activity: Goal: Risk for activity intolerance will decrease Outcome: Progressing   Problem: Nutrition: Goal: Adequate nutrition will be maintained Outcome: Progressing   Problem: Coping: Goal: Level of anxiety will decrease Outcome: Progressing   Problem: Elimination: Goal: Will not experience complications related to bowel motility Outcome: Progressing Goal: Will not experience complications related to urinary retention Outcome: Progressing   Problem: Pain Management: Goal: General experience of comfort will improve Outcome: Progressing   Problem: Safety: Goal: Ability to remain free from injury will improve Outcome: Progressing   Problem: Skin Integrity: Goal: Risk for impaired skin integrity will decrease Outcome: Progressing   Problem: Education: Goal: Knowledge of disease or condition will improve Outcome: Progressing Goal: Knowledge of the prescribed therapeutic regimen will improve Outcome: Progressing Goal: Individualized Educational Video(s) Outcome: Progressing   Problem: Clinical Measurements: Goal: Complications related to the disease process, condition or treatment will be avoided or minimized Outcome: Progressing   Problem: Education: Goal: Ability to describe self-care measures that may prevent or decrease complications (Diabetes Survival Skills Education) will improve Outcome: Progressing Goal: Individualized  Educational Video(s) Outcome: Progressing   Problem: Coping: Goal: Ability to adjust to condition or change in health will improve Outcome: Progressing   Problem: Fluid Volume: Goal: Ability to maintain a balanced intake and output will improve Outcome: Progressing   Problem: Health Behavior/Discharge Planning: Goal: Ability to identify and utilize available resources and services will improve Outcome: Progressing Goal: Ability to manage health-related needs will improve Outcome: Progressing   Problem: Metabolic: Goal: Ability to maintain appropriate glucose levels will improve Outcome: Progressing   Problem: Nutritional: Goal: Maintenance of adequate nutrition will improve Outcome: Progressing Goal: Progress toward achieving an optimal weight will improve Outcome: Progressing   Problem: Skin Integrity: Goal: Risk for impaired skin integrity will decrease Outcome: Progressing   Problem: Tissue Perfusion: Goal: Adequacy of tissue perfusion will improve Outcome: Progressing

## 2023-04-18 NOTE — Inpatient Diabetes Management (Signed)
 ADA Standards of Care 2023 Diabetes in Pregnancy Target Glucose Ranges:  Fasting: 70 - 95 mg/dL 1 hr postprandial:  889 - 140mg /dL (from first bite of meal) 2 hr postprandial:  100 - 120 mg/dL (from first bit of meal)      Patient [redacted]w[redacted]d admitted with vaginal bleeding Received Betamethasone  12 mg on 12/27 and 12/28 Was just admitted 12/28 thru 01/02 Pregnancy complicated by Posterior Previa     Diabetes history: New GDM diagnosis 04/10/2023   Outpatient Diabetes meds: Metformin  500 mg BID                                              Freestyle Libre 3 CGM   Current orders: Metformin  500 mg BID                           Novolog  0-16 units TID after meals    MD- Note pt had CGM reading 68 mg/dl yesterday at 8541 after receiving 2 units Novolog  at 12pm  May consider reducing the Correction Novolog  scale to the 0-14 unit scale TID after meals    --Will follow patient during hospitalization--  Adina Rudolpho Arrow RN, MSN, CDCES Diabetes Coordinator Inpatient Glycemic Control Team Team Pager: [phone] (8a-5p)

## 2023-04-18 NOTE — Progress Notes (Signed)
 Patient ID: Victoria Cline, female   DOB: 17-Sep-1990, 33 y.o.   MRN: [id number] FACULTY PRACTICE ANTEPARTUM(COMPREHENSIVE) NOTE  Victoria Cline is a 33 y.o. G3P0020 at [redacted]w[redacted]d by best clinical estimate who is admitted for bleeding previa.   Fetal presentation is cephalic. Length of Stay:  2  Days  ASSESSMENT: Principal Problem:   Placenta previa antepartum in third trimester Active Problems:   GDM (gestational diabetes mellitus)   PLAN: On metformin  - CBGs are good Has SSI if needed No bleeding but 2nd bleed and needs to remain in house until delivery  Subjective: Doing well. Patient reports the fetal movement as active. Patient reports uterine contraction  activity as none. Patient reports  vaginal bleeding as none. Patient describes fluid per vagina as None.  Vitals:  Blood pressure (!) 109/54, pulse (!) 103, temperature 98.3 F (36.8 C), temperature source Oral, resp. rate 19, height 5' 3 (1.6 m), weight 73.9 kg, last menstrual period 08/28/2022, SpO2 100%. Physical Examination:  General appearance - alert, well appearing, and in no distress Chest - normal effort Abdomen - gravid, non-tender Fundal Height:  size equals dates Extremities: Homans sign is negative, no sign of DVT  Membranes:intact  Fetal Monitoring:  Baseline: 160 bpm, Variability: Good {> 6 bpm), Accelerations: Reactive, and Decelerations: Absent  Labs:  Results for orders placed or performed during the hospital encounter of 04/16/23 (from the past 24 hours)  Glucose, capillary   Collection Time: 04/17/23  9:21 AM  Result Value Ref Range   Glucose-Capillary 92 70 - 99 mg/dL  Glucose, capillary   Collection Time: 04/17/23  3:00 PM  Result Value Ref Range   Glucose-Capillary 86 70 - 99 mg/dL  Glucose, capillary   Collection Time: 04/17/23  9:55 PM  Result Value Ref Range   Glucose-Capillary 110 (H) 70 - 99 mg/dL  Glucose, capillary   Collection Time: 04/18/23  6:04 AM  Result Value Ref  Range   Glucose-Capillary 117 (H) 70 - 99 mg/dL    Imaging Studies:      Medications:  Scheduled  aspirin  EC  81 mg Oral QHS   insulin  aspart  0-16 Units Subcutaneous TID PC   lidocaine   1 patch Transdermal Q24H   metFORMIN   500 mg Oral BID WC   mometasone -formoterol   2 puff Inhalation BID   prenatal multivitamin  1 tablet Oral Q1200   sodium chloride  flush  3 mL Intravenous Q12H   I have reviewed the patient's current medications.   Glenys GORMAN Birk, MD 04/18/2023,7:51 AM

## 2023-04-19 LAB — TYPE AND SCREEN
ABO/RH(D): B POS
Antibody Screen: NEGATIVE

## 2023-04-19 LAB — CBC
HCT: 32.3 % — ABNORMAL LOW (ref 36.0–46.0)
Hemoglobin: 10.3 g/dL — ABNORMAL LOW (ref 12.0–15.0)
MCH: 25.3 pg — ABNORMAL LOW (ref 26.0–34.0)
MCHC: 31.9 g/dL (ref 30.0–36.0)
MCV: 79.4 fL — ABNORMAL LOW (ref 80.0–100.0)
Platelets: 344 10*3/uL (ref 150–400)
RBC: 4.07 MIL/uL (ref 3.87–5.11)
RDW: 14.7 % (ref 11.5–15.5)
WBC: 12.3 10*3/uL — ABNORMAL HIGH (ref 4.0–10.5)
nRBC: 0.7 % — ABNORMAL HIGH (ref 0.0–0.2)

## 2023-04-19 LAB — GLUCOSE, CAPILLARY
Glucose-Capillary: 117 mg/dL — ABNORMAL HIGH (ref 70–99)
Glucose-Capillary: 98 mg/dL (ref 70–99)
Glucose-Capillary: 117 mg/dL — ABNORMAL HIGH (ref 70–99)

## 2023-04-19 MED ORDER — FERROUS SULFATE 325 (65 FE) MG PO TABS
325.0000 mg | ORAL_TABLET | ORAL | Status: DC
  Administered 2023-04-20 – 2023-04-22 (×2): 325 mg via ORAL
  Filled 2023-04-19 (×2): qty 1
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
## 2023-04-19 NOTE — Inpatient Diabetes Management (Signed)
 Inpatient Diabetes Program Recommendations  Diabetes Treatment Program Recommendations  ADA Standards of Care Diabetes in Pregnancy Target Glucose Ranges:  Fasting: 70 - 95 mg/dL 1 hr postprandial: Less than 140mg /dL (from first bite of meal) 2 hr postprandial: Less than 120 mg/dL (from first bite of meal)     Lab Results  Component Value Date   GLUCAP 98 04/19/2023   HGBA1C 5.5 12/23/2022    Review of Glycemic Control  Latest Reference Range & Units 04/18/23 19:48 04/18/23 22:12 04/19/23 07:56 04/19/23 11:01  Glucose-Capillary 70 - 99 mg/dL 863 (H) 870 (H) 882 (H) 98  (H): Data is abnormally high Diabetes history: New GDM diagnosis 04/10/2023   Outpatient Diabetes meds: Metformin  500 mg BID                                              Freestyle Libre 3 CGM   Current orders: Metformin  500 mg BID                           Novolog  0-16 units TID after meals    Inpatient Diabetes Program Recommendations:    Spoke with patient regarding CGM values compared to fingersticks. Patient reports confusion on purpose of CGM if staff continuing to check fingersticks.  Reviewed current glucose trends, current CGM values, target goals, and CHo intake. Most CGM readings within 20% of fingersticks. Not requiring consistent dosing of insulin . Recommend continuing to use CGM for ordered CBGs with fingerstick Qshift.  Secure chat sent to RN & Md. No further questions at this time.   Thanks, Tinnie Minus, MSN, RNC-OB Diabetes Coordinator [phone] (8a-5p)

## 2023-04-19 NOTE — Progress Notes (Signed)
 IV restarted due to pt high risk pregnancy and need for IV access for possible emergent delivery. Discussed PICC line with pt. Pt states she does not want a PICC line.

## 2023-04-20 LAB — GLUCOSE, CAPILLARY: Glucose-Capillary: 124 mg/dL — ABNORMAL HIGH (ref 70–99)

## 2023-04-20 MED ORDER — OXYCODONE-ACETAMINOPHEN 5-325 MG PO TABS
1.0000 | ORAL_TABLET | Freq: Four times a day (QID) | ORAL | Status: DC | PRN
  Administered 2023-04-20 – 2023-04-21 (×4): 2 via ORAL
  Filled 2023-04-20 (×4): qty 2

## 2023-04-20 MED ORDER — ONDANSETRON 4 MG PO TBDP
8.0000 mg | ORAL_TABLET | Freq: Three times a day (TID) | ORAL | Status: DC | PRN
  Administered 2023-04-21: 8 mg via ORAL

## 2023-04-20 MED ORDER — DIPHENHYDRAMINE HCL 50 MG/ML IJ SOLN
25.0000 mg | Freq: Once | INTRAMUSCULAR | Status: AC
  Administered 2023-04-21: 25 mg via INTRAVENOUS
  Filled 2023-04-20: qty 1

## 2023-04-20 MED ORDER — MAGNESIUM SULFATE 2 GM/50ML IV SOLN
2.0000 g | Freq: Once | INTRAVENOUS | Status: AC
  Administered 2023-04-21: 2 g via INTRAVENOUS
  Filled 2023-04-20: qty 50

## 2023-04-20 MED ORDER — SUMATRIPTAN SUCCINATE 50 MG PO TABS
50.0000 mg | ORAL_TABLET | ORAL | Status: AC | PRN
  Administered 2023-04-20 (×2): 50 mg via ORAL
  Filled 2023-04-20 (×4): qty 1

## 2023-04-20 MED ORDER — BUTALBITAL-APAP-CAFFEINE 50-325-40 MG PO TABS
2.0000 | ORAL_TABLET | Freq: Three times a day (TID) | ORAL | Status: DC | PRN
  Administered 2023-04-20: 2 via ORAL
  Filled 2023-04-20: qty 2

## 2023-04-20 MED ORDER — LACTATED RINGERS IV BOLUS
1000.0000 mL | Freq: Once | INTRAVENOUS | Status: AC
  Administered 2023-04-20: 1000 mL via INTRAVENOUS

## 2023-04-20 MED ORDER — VITAMIN B-12 1000 MCG PO TABS
1000.0000 ug | ORAL_TABLET | Freq: Every day | ORAL | Status: DC
  Administered 2023-04-20 – 2023-04-21 (×2): 1000 ug via ORAL
  Filled 2023-04-20 (×4): qty 1

## 2023-04-20 MED ORDER — METOCLOPRAMIDE HCL 5 MG/ML IJ SOLN
10.0000 mg | Freq: Four times a day (QID) | INTRAMUSCULAR | Status: DC | PRN
  Administered 2023-04-21 (×2): 10 mg via INTRAVENOUS
  Filled 2023-04-20 (×2): qty 2

## 2023-04-20 NOTE — Progress Notes (Signed)
 FACULTY PRACTICE ANTEPARTUM COMPREHENSIVE PROGRESS NOTE  Victoria Cline is a 33 y.o. G3P0020 at [redacted]w[redacted]d who is admitted for placenta previa - bleed #2.  Estimated Date of Delivery: 07/02/23 Fetal presentation is breech.  Length of Stay:  4 Days. Admitted 04/16/2023  Subjective: HA this morning, just received tylenol  and flexeril . No visual changes, fevers/chills.  No further bleeding. No LOF/pain/ctx. +FM  Vitals:  Blood pressure 114/71, pulse 86, temperature 97.7 F (36.5 C), temperature source Oral, resp. rate 18, height 5' 3 (1.6 m), weight 73.9 kg, last menstrual period 08/28/2022, SpO2 99%. Physical Examination: CONSTITUTIONAL: Well-developed, well-nourished female in no acute distress.  CARDIOVASCULAR: Normal heart rate noted, RESPIRATORY: Effort normal, no problems with respiration noted ABDOMEN: Soft, nontender, nondistended, gravid.  Fetal monitoring: FHR: 150 bpm, Variability: moderate, Accelerations: 10x10, Decelerations: Absent  Uterine activity: flat contractions per hour  Results for orders placed or performed during the hospital encounter of 04/16/23 (from the past 48 hours)  Vitamin B12     Status: None   Collection Time: 04/18/23  2:28 PM  Result Value Ref Range   Vitamin B-12 266 180 - 914 pg/mL    Comment: (NOTE) This assay is not validated for testing neonatal or myeloproliferative syndrome specimens for Vitamin B12 levels. Performed at [facility], [address], [place], [place] [address]   Folate     Status: None   Collection Time: 04/18/23  2:28 PM  Result Value Ref Range   Folate >40.0 >5.9 ng/mL    Comment: RESULT CONFIRMED BY AUTOMATED DILUTION Performed at [facility], [address] [place], [place] [address]   Iron and TIBC     Status: Abnormal   Collection Time: 04/18/23  2:28 PM  Result Value Ref Range   Iron 83 28 - 170 ug/dL   TIBC 414 (H) 749 - 549 ug/dL   Saturation Ratios 14 10.4 - 31.8 %   UIBC 502 ug/dL     Comment: Performed at [facility], [address] [place], [place] [address]  Ferritin     Status: None   Collection Time: 04/18/23  2:28 PM  Result Value Ref Range   Ferritin 11 11 - 307 ng/mL    Comment: Performed at [facility], [address], [place], [place] [address]  Glucose, capillary     Status: Abnormal   Collection Time: 04/18/23  2:53 PM  Result Value Ref Range   Glucose-Capillary 141 (H) 70 - 99 mg/dL    Comment: Glucose reference range applies only to samples taken after fasting for at least 8 hours.  Glucose, capillary     Status: Abnormal   Collection Time: 04/18/23  7:48 PM  Result Value Ref Range   Glucose-Capillary 136 (H) 70 - 99 mg/dL    Comment: Glucose reference range applies only to samples taken after fasting for at least 8 hours.  Glucose, capillary     Status: Abnormal   Collection Time: 04/18/23 10:12 PM  Result Value Ref Range   Glucose-Capillary 129 (H) 70 - 99 mg/dL    Comment: Glucose reference range applies only to samples taken after fasting for at least 8 hours.  Type and screen [facility]     Status: None   Collection Time: 04/19/23  1:19 AM  Result Value Ref Range   ABO/RH(D) B POS    Antibody Screen NEG    Sample Expiration      04/22/2023,2359 Performed at [facility], [address], [place], [place] [address]   CBC     Status: Abnormal   Collection Time: 04/19/23  1:20 AM  Result Value Ref Range   WBC 12.3 (H) 4.0 - 10.5 K/uL   RBC 4.07 3.87 - 5.11 MIL/uL   Hemoglobin 10.3 (L) 12.0 - 15.0 g/dL   HCT 67.6 (L) 63.9 - 53.9 %   MCV 79.4 (L) 80.0 - 100.0 fL   MCH 25.3 (L) 26.0 - 34.0 pg   MCHC 31.9 30.0 - 36.0 g/dL   RDW 85.2 88.4 - 84.4 %   Platelets 344 150 - 400 K/uL   nRBC 0.7 (H) 0.0 - 0.2 %    Comment: Performed at [facility], [address] [place], [place] [address]  Glucose, capillary     Status: Abnormal   Collection Time: 04/19/23  7:56 AM  Result Value Ref Range    Glucose-Capillary 117 (H) 70 - 99 mg/dL    Comment: Glucose reference range applies only to samples taken after fasting for at least 8 hours.  Glucose, capillary     Status: None   Collection Time: 04/19/23 11:01 AM  Result Value Ref Range   Glucose-Capillary 98 70 - 99 mg/dL    Comment: Glucose reference range applies only to samples taken after fasting for at least 8 hours.  Glucose, capillary     Status: Abnormal   Collection Time: 04/19/23  7:18 PM  Result Value Ref Range   Glucose-Capillary 117 (H) 70 - 99 mg/dL    Comment: Glucose reference range applies only to samples taken after fasting for at least 8 hours.    US  MFM OB FOLLOW UP Result Date: 04/18/2023 ----------------------------------------------------------------------  OBSTETRICS REPORT                       (Signed Final 04/18/2023 09:40 pm) ---------------------------------------------------------------------- Patient Info  ID #:       [id number]                          D.O.B.:  1990/04/23 (32 yrs)(F)  Name:       Victoria Cline            Visit Date: 04/18/2023 02:55 pm ---------------------------------------------------------------------- Performed By  Attending:        Steffan Keys MD         Ref. Address:      [address]                                                              [address]  Performed By:     Heron Gay        Secondary Phy.:    [facility] MAU/Triage                    RDMS  Referred By:      [facility]       [facility] ---------------------------------------------------------------------- Orders  #  Description                           Code        Ordered By  1  US  MFM OB FOLLOW UP                   [id number]    Victoria Cline ----------------------------------------------------------------------  #  Order #                     Accession #                Episode #  1  [id number]                   [id number]                  [id number] ---------------------------------------------------------------------- Indications  Vaginal bleeding in pregnancy, third trimester  O46.93  Placenta previa with hemorrhage, third          O44.13  trimester  [redacted] weeks gestation of pregnancy                 Z3A.29 ---------------------------------------------------------------------- Fetal Evaluation  Num Of Fetuses:          1  Fetal Heart Rate(bpm):   134  Cardiac Activity:        Observed  Presentation:            Breech  Placenta:                Posterior previa  Amniotic Fluid  AFI FV:      Within normal limits  AFI Sum(cm)     %Tile       Largest Pocket(cm)  16.             58          4.8  RUQ(cm)       RLQ(cm)       LUQ(cm)        LLQ(cm)  4.8           3.8           3.3            4.1 ---------------------------------------------------------------------- Biometry  BPD:      75.4  mm     G. Age:  30w 2d         69  %    CI:        77.11   %    70 - 86                                                          FL/HC:       20.5  %    19.6 - 20.8  HC:      271.9  mm     G. Age:  29w 5d         27  %    HC/AC:       1.01       0.99 - 1.21  AC:      268.3  mm     G. Age:  43w 0d  87  %    FL/BPD:      74.0  %    71 - 87  FL:       55.8  mm     G. Age:  29w 3d         37  %    FL/AC:       20.8  %    20 - 24  CER:      36.9  mm     G. Age:  30w 4d         84  %  Est. FW:    1540   gm     3 lb 6 oz     73  % ---------------------------------------------------------------------- OB History  Gravidity:    3         Term:   0         SAB:   2 ---------------------------------------------------------------------- Gestational Age  LMP:           33w 2d        Date:  08/28/22                  EDD:   06/04/23  U/S Today:     30w 1d                                        EDD:   06/26/23  Best:          29w 2d     Det. By:  Early Ultrasound         EDD:   07/02/23                                      (11/11/22)  ---------------------------------------------------------------------- Anatomy  Heart:                 Appears normal         Abdominal Wall:         Appears nml (cord                         (4CH, axis, and                                insert, abd wall)                         situs)  Diaphragm:             Appears normal         Kidneys:                Appear normal  Stomach:               Appears normal, left   Bladder:                Appears normal                         sided  Abdomen:               Appears normal ---------------------------------------------------------------------- Comments  This patient has been hospitalized due to vaginal bleeding  with placenta  previa.  The overall EFW obtained today was 3 pounds 6 ounces  (73rd percentile for her gestational age).  Normal amniotic fluid with a total AFI of 16 cm is noted.  A posterior placenta previa continues to be noted.  The fetus is in the breech presentation. ----------------------------------------------------------------------                  Steffan Keys, MD Electronically Signed Final Report   04/18/2023 09:40 pm ----------------------------------------------------------------------    Current scheduled medications  aspirin  EC  81 mg Oral QHS   vitamin B-12  1,000 mcg Oral Daily   ferrous sulfate   325 mg Oral QODAY   insulin  aspart  0-12 Units Subcutaneous Q0600   insulin  aspart  0-16 Units Subcutaneous TID PC   lidocaine   1 patch Transdermal Q24H   mometasone -formoterol   2 puff Inhalation BID   prenatal multivitamin  1 tablet Oral Q1200   sodium chloride  flush  3 mL Intravenous Q12H    I have reviewed the patient's current medications.  ASSESSMENT: Principal Problem:   Placenta previa antepartum in third trimester Active Problems:   GDM (gestational diabetes mellitus)   PLAN: Anticipate inpatient management until 7 days without bleeding S/p BMZ & mag NICU consult today F/u DM coordinator recs Start PO iron & B12  supplements for anemia  Continue routine antenatal care.  Kieth Carolin, MD Obstetrician & Gynecologist, [facility] for [employer], [facility]
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
## 2023-04-20 NOTE — Consult Note (Signed)
 ..Consultation Service: Neonatology   Dr. Eveline has asked for consultation on Victoria Cline with MRN# [id number] regarding the care of a premature infant at 29+[redacted] weeks GA. Thank you for inviting us  to see this patient.   Reason for consult:  Explain the possible complications, the prognosis, and the care of a premature infant at 29-[redacted] weeks gestation.   My key findings of this patient's HPI are:  I have reviewed the patient's chart and have met with her. The salient information is as follows:   Victoria Cline is a 33 y.o. female G56P0020 presenting for onset of heavy vaginal bleeding on 1/3, after a previous admission 12/28-1/2 for same complaint.  She was discharged home after 4 days without bleeding, and now readmitted with recurrent ble3eding in setting of a known posterior placenta previa.  Her pregnancy has otherwise been complicated by GDMA2 req Metformin  -> now insulin , Asthma, alpha thal trait, h/o mood disorder no current meds.  A recent ultrasound on 1/5 gave EFW 1540 grams, 73%ile.  She is not in labor, membranes are intact, fetal status is reassuring, and plan is for inpatient observation until delivery.   Prenatal labs: B+ Ab neg, RPR NR on 12/28, Hep B S Ag Neg, HIV NR, HCV NR, GCC Neg, GBS UNK   Prenatal care:   good Maternal antibiotics: no antibiotics, membranes are intact Maternal Steroids: BMZ complete 12/30   Most recent dose:  12/29    My recommendations for this patient and my actions included:   1. In the presence of Victoria Cline, I spent 30 minutes discussing the possible complications and outcomes of prematurity at this gestational age. I discussed the potential need for resuscitation at birth, mechanical ventilation and surfactant administration for respiratory distress, IV fluids pending establishment of enteral feeds (encouraged breast milk feeding to which she planned to do), antibiotics for possible sepsis, temperature support, and monitoring. I also discussed the  potential risk of complications such as intracranial hemorrhage, retinopathy, hearing deficit, and chronic lung disease. I also discussed the potential length of stay in the neonatal intensive care unit for about 6-10 weeks. I discussed this with parents in detail and they expressed an understanding of the risks and complications of prematurity.   2. I also discussed the expected survival of an infant born at 44 weeks, which is near 80-90%. We further discussed that some of neonates born at this age have severe neurological complications and that some have school difficulties. She expressed an understanding of this information.   3. I informed her that the NICU team would be present at the delivery. She was counseled on the potential immediate measures which may be required to resuscitate her infant at the delivery. I stressed that we will make every effort to let her see her baby before transfer to NICU, but that, depending on the clinical condition of her child, that may not be possible.  I recommended that a support person/ parent accompany us  to the NICU during the transfer, and reviewed that a member of the care team would return to update her as soon as possible after stabilization of her baby.    4. A visit to the NICU by the infant's mother and/or a significant other was encouraged. Visitation policy was discussed.  The parents are planning on naming their baby [name].   Final Impression:  33 yo G19P0010  female with a viable IUP who is has a high risk medical condition (bleeding previa) which may prompt preterm delivery, and who  now understands the possible complications and prognosis of her infant.   Thank you for this consultation request and please alert the neonatology team if further questions or concerns arise, or the plan for delivery abruptly changes where additional prenatal counseling would benefit this patient.    Face to face consultation time: 25 minutes Chart Review time: 20  minutes Total Consultation time: 45 minutes   Earnie Marina, MD NICU Attending

## 2023-04-21 ENCOUNTER — Encounter (HOSPITAL_COMMUNITY): Payer: Self-pay | Admitting: Family Medicine

## 2023-04-21 ENCOUNTER — Other Ambulatory Visit (HOSPITAL_COMMUNITY): Payer: Self-pay

## 2023-04-21 ENCOUNTER — Encounter: Payer: No Typology Code available for payment source | Admitting: Family Medicine

## 2023-04-21 DIAGNOSIS — O24414 Gestational diabetes mellitus in pregnancy, insulin controlled: Secondary | ICD-10-CM

## 2023-04-21 LAB — CBC
HCT: 32.2 % — ABNORMAL LOW (ref 36.0–46.0)
Hemoglobin: 10.2 g/dL — ABNORMAL LOW (ref 12.0–15.0)
MCH: 25.2 pg — ABNORMAL LOW (ref 26.0–34.0)
MCHC: 31.7 g/dL (ref 30.0–36.0)
MCV: 79.7 fL — ABNORMAL LOW (ref 80.0–100.0)
Platelets: 342 10*3/uL (ref 150–400)
RBC: 4.04 MIL/uL (ref 3.87–5.11)
RDW: 14.8 % (ref 11.5–15.5)
WBC: 13.3 10*3/uL — ABNORMAL HIGH (ref 4.0–10.5)
nRBC: 0.8 % — ABNORMAL HIGH (ref 0.0–0.2)

## 2023-04-21 LAB — GLUCOSE, CAPILLARY: Glucose-Capillary: 115 mg/dL — ABNORMAL HIGH (ref 70–99)

## 2023-04-21 MED ORDER — METOCLOPRAMIDE HCL 5 MG/ML IJ SOLN
10.0000 mg | Freq: Four times a day (QID) | INTRAMUSCULAR | Status: DC | PRN
  Administered 2023-04-22 – 2023-04-23 (×4): 10 mg via INTRAVENOUS
  Filled 2023-04-21 (×3): qty 2

## 2023-04-21 MED ORDER — ONDANSETRON 4 MG PO TBDP
ORAL_TABLET | ORAL | Status: AC
  Filled 2023-04-21: qty 1

## 2023-04-21 MED ORDER — MAGNESIUM SULFATE 2 GM/50ML IV SOLN
2.0000 g | Freq: Once | INTRAVENOUS | Status: AC
  Administered 2023-04-21: 2 g via INTRAVENOUS
  Filled 2023-04-21: qty 50

## 2023-04-21 MED ORDER — DIPHENHYDRAMINE HCL 50 MG/ML IJ SOLN
25.0000 mg | Freq: Once | INTRAMUSCULAR | Status: AC
  Administered 2023-04-21: 25 mg via INTRAVENOUS
  Filled 2023-04-21: qty 1

## 2023-04-21 MED ORDER — LACTATED RINGERS IV BOLUS
1000.0000 mL | Freq: Once | INTRAVENOUS | Status: AC
  Administered 2023-04-21: 1000 mL via INTRAVENOUS

## 2023-04-21 NOTE — Progress Notes (Signed)
 RN attempted to do qshift monitoring, pt declined and stated she does not want to be monitored at this time.

## 2023-04-21 NOTE — Progress Notes (Signed)
 OB Note Called to see patient re: HA and plan of care. Patient with HA since yesterday. No prior h/o HA and has been ongoing since then. It was relieved with oxycodone  but then came back later this afternoon. Patient smokes about 1-2 cigarettes per day and hasn't smoked any since being admitted to the hospital; she denies any marijuana usage during this pregnancy. She didn't sleep much last night or today during the day.   I d/w her that could be lack of sleep or nicotine  withdrawal and recommended she consider nicotine  patch or gum. I also recommended medication to help her to sleep as well. I told her if her HA worsens then recommend MRA/MRV but recommended to try the above interventions first. Risk of needing to use contrast d/w her.  Patient to consider options.   Bebe Izell Raddle MD Attending Center for [employer] (Faculty Practice) 04/21/2023 Time: 2148

## 2023-04-21 NOTE — Progress Notes (Signed)
 Patient ID: Victoria Cline, female   DOB: December 13, 1990, 33 y.o.   MRN: [id number] FACULTY PRACTICE ANTEPARTUM COMPREHENSIVE PROGRESS NOTE  Victoria Cline is a 33 y.o. G3P0020 at [redacted]w[redacted]d who is admitted for placenta previa - bleed #2.  Estimated Date of Delivery: 07/02/23 Fetal presentation is breech.  Length of Stay:  5 Days. Admitted 04/16/2023  Subjective: Patient reports return of HA this morning following headache cocktail. Patient reports little improvement with percocet. No visual changes, fevers/chills. Patient is tolerating a regular diet overnight. She reports good fetal movement. She denies any episodes of vaginal bleeding, leakage of fluid or contractions   Vitals:  Blood pressure 127/70, pulse 98, temperature 98.4 F (36.9 C), temperature source Oral, resp. rate 18, height 5' 3 (1.6 m), weight 73.9 kg, last menstrual period 08/28/2022, SpO2 99%. Physical Examination: CONSTITUTIONAL: Well-developed, well-nourished female in no acute distress.  CARDIOVASCULAR: Normal heart rate noted, RESPIRATORY: Effort normal, no problems with respiration noted ABDOMEN: Soft, nontender, nondistended, gravid.  Fetal monitoring: FHR: 145 bpm, Variability: moderate, Accelerations: 15x15, Decelerations: Absent  Uterine activity: none  Results for orders placed or performed during the hospital encounter of 04/16/23 (from the past 48 hours)  Glucose, capillary     Status: Abnormal   Collection Time: 04/19/23  7:56 AM  Result Value Ref Range   Glucose-Capillary 117 (H) 70 - 99 mg/dL    Comment: Glucose reference range applies only to samples taken after fasting for at least 8 hours.  Glucose, capillary     Status: None   Collection Time: 04/19/23 11:01 AM  Result Value Ref Range   Glucose-Capillary 98 70 - 99 mg/dL    Comment: Glucose reference range applies only to samples taken after fasting for at least 8 hours.  Glucose, capillary     Status: Abnormal   Collection Time: 04/19/23  7:18  PM  Result Value Ref Range   Glucose-Capillary 117 (H) 70 - 99 mg/dL    Comment: Glucose reference range applies only to samples taken after fasting for at least 8 hours.  Glucose, capillary     Status: Abnormal   Collection Time: 04/20/23  8:50 AM  Result Value Ref Range   Glucose-Capillary 124 (H) 70 - 99 mg/dL    Comment: Glucose reference range applies only to samples taken after fasting for at least 8 hours.  CBC     Status: Abnormal   Collection Time: 04/21/23  1:30 AM  Result Value Ref Range   WBC 13.3 (H) 4.0 - 10.5 K/uL   RBC 4.04 3.87 - 5.11 MIL/uL   Hemoglobin 10.2 (L) 12.0 - 15.0 g/dL   HCT 67.7 (L) 63.9 - 53.9 %   MCV 79.7 (L) 80.0 - 100.0 fL   MCH 25.2 (L) 26.0 - 34.0 pg   MCHC 31.7 30.0 - 36.0 g/dL   RDW 85.1 88.4 - 84.4 %   Platelets 342 150 - 400 K/uL   nRBC 0.8 (H) 0.0 - 0.2 %    Comment: Performed at [facility], [address], [place], [place] [address]  Glucose, capillary     Status: Abnormal   Collection Time: 04/21/23  5:58 AM  Result Value Ref Range   Glucose-Capillary 115 (H) 70 - 99 mg/dL    Comment: Glucose reference range applies only to samples taken after fasting for at least 8 hours.    No results found.   Current scheduled medications  aspirin  EC  81 mg Oral QHS   vitamin B-12  1,000 mcg Oral Daily   diphenhydrAMINE   25 mg Intravenous Once   ferrous sulfate   325 mg Oral QODAY   insulin  aspart  0-12 Units Subcutaneous Q0600   insulin  aspart  0-16 Units Subcutaneous TID PC   lidocaine   1 patch Transdermal Q24H   mometasone -formoterol   2 puff Inhalation BID   prenatal multivitamin  1 tablet Oral Q1200   sodium chloride  flush  3 mL Intravenous Q12H    I have reviewed the patient's current medications.  ASSESSMENT: Principal Problem:   Placenta previa antepartum in third trimester Active Problems:   GDM (gestational diabetes mellitus)   PLAN: Patient completed  BMZ & mag NICU consult appreciated F/u DM coordinator recs- CBGs  well controlled for the most part. Patient ate pasta after midnight Will treat headache with reglan , magnesium  and benadryl , followed by an LR bolus Encourage po hydration Continue routine antenatal care. Continue inpatient monitoring due to bleeding previa  Winton Felt, MD Obstetrician & Gynecologist, [facility] for [employer], [facility]
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
## 2023-04-21 NOTE — Social Work (Signed)
 CSW received consult Pt reports trouble obtaining medications. CSW met with MOB to offer support and complete assessment.    CSW followed up with MOB at bedside and assessed further. MOB reported that she cannot afford the $40 co-pay for her medications. CSW asked MOB if the insurance on file ([employer] & Medicaid) were active. MOB reported that she thought they were both active. MOB reported in the past Medicaid has covered her co-pay but is not sure why she has to pay now. CSW encouraged MOB to contact Medicaid to determine the change. MOB reported that she would contact them. CSW reached out to [facility] for support.   Nat Quiet, MSW, LCSW Clinical Social Worker  [phone] 04/21/2023  3:43 PM

## 2023-04-21 NOTE — Progress Notes (Signed)
 RN attempted to do qshift monitoring, pt declined. Pt states she will do her monitoring when her headache is better and she gets "fresh air".

## 2023-04-21 NOTE — Progress Notes (Signed)
 Patient continues to decline shift fetal monitoring.

## 2023-04-22 ENCOUNTER — Ambulatory Visit: Payer: No Typology Code available for payment source

## 2023-04-22 ENCOUNTER — Other Ambulatory Visit (HOSPITAL_COMMUNITY): Payer: Self-pay

## 2023-04-22 LAB — CBC
HCT: 29.6 % — ABNORMAL LOW (ref 36.0–46.0)
Hemoglobin: 9.5 g/dL — ABNORMAL LOW (ref 12.0–15.0)
MCH: 25.5 pg — ABNORMAL LOW (ref 26.0–34.0)
MCHC: 32.1 g/dL (ref 30.0–36.0)
MCV: 79.6 fL — ABNORMAL LOW (ref 80.0–100.0)
Platelets: 326 10*3/uL (ref 150–400)
RBC: 3.72 MIL/uL — ABNORMAL LOW (ref 3.87–5.11)
RDW: 14.8 % (ref 11.5–15.5)
WBC: 13.1 10*3/uL — ABNORMAL HIGH (ref 4.0–10.5)
nRBC: 0.7 % — ABNORMAL HIGH (ref 0.0–0.2)

## 2023-04-22 LAB — GLUCOSE, CAPILLARY
Glucose-Capillary: 97 mg/dL (ref 70–99)
Glucose-Capillary: 162 mg/dL — ABNORMAL HIGH (ref 70–99)

## 2023-04-22 MED ORDER — HYDROXYZINE HCL 10 MG PO TABS
10.0000 mg | ORAL_TABLET | Freq: Three times a day (TID) | ORAL | Status: DC | PRN
  Administered 2023-04-22: 10 mg via ORAL
  Filled 2023-04-22: qty 1

## 2023-04-22 NOTE — Progress Notes (Signed)
 Pt off of unit, will try NST later

## 2023-04-22 NOTE — Progress Notes (Signed)
 Patient ID: Victoria Cline, female   DOB: 14-Feb-1991, 33 y.o.   MRN: [id number] FACULTY PRACTICE ANTEPARTUM COMPREHENSIVE PROGRESS NOTE  Chardae Mulkern is a 33 y.o. G3P0020 at [redacted]w[redacted]d who is admitted for placenta previa - bleed #2.  Estimated Date of Delivery: 07/02/23 Fetal presentation is breech.  Length of Stay:  6 Days. Admitted 04/16/2023  Subjective: Patient reports feeling well, denying a HA this morning. She admits to not sleeping well. She is trying to increase her fluid intake. She reports good fetal movement. She denies any episodes of vaginal bleeding, leakage of fluid or contractions   Vitals:  Blood pressure 127/70, pulse (!) 111, temperature 98.5 F (36.9 C), temperature source Oral, resp. rate 18, height 5' 3 (1.6 m), weight 73.9 kg, last menstrual period 08/28/2022, SpO2 97%. Physical Examination: CONSTITUTIONAL: Well-developed, well-nourished female in no acute distress.  CARDIOVASCULAR: Normal heart rate noted, RESPIRATORY: Effort normal, no problems with respiration noted ABDOMEN: Soft, nontender, nondistended, gravid.  Fetal monitoring: FHR: 140 bpm, Variability: moderate, Accelerations: absent, Decelerations: Absent  Uterine activity: none  Results for orders placed or performed during the hospital encounter of 04/16/23 (from the past 48 hours)  CBC     Status: Abnormal   Collection Time: 04/21/23  1:30 AM  Result Value Ref Range   WBC 13.3 (H) 4.0 - 10.5 K/uL   RBC 4.04 3.87 - 5.11 MIL/uL   Hemoglobin 10.2 (L) 12.0 - 15.0 g/dL   HCT 67.7 (L) 63.9 - 53.9 %   MCV 79.7 (L) 80.0 - 100.0 fL   MCH 25.2 (L) 26.0 - 34.0 pg   MCHC 31.7 30.0 - 36.0 g/dL   RDW 85.1 88.4 - 84.4 %   Platelets 342 150 - 400 K/uL   nRBC 0.8 (H) 0.0 - 0.2 %    Comment: Performed at [facility], [address], [place], [place] [address]  Glucose, capillary     Status: Abnormal   Collection Time: 04/21/23  5:58 AM  Result Value Ref Range   Glucose-Capillary 115 (H) 70 - 99  mg/dL    Comment: Glucose reference range applies only to samples taken after fasting for at least 8 hours.  Type and screen [facility]     Status: None   Collection Time: 04/22/23 12:07 AM  Result Value Ref Range   ABO/RH(D) B POS    Antibody Screen NEG    Sample Expiration      04/25/2023,2359 Performed at [facility], [address] [place], [place] [address]   CBC     Status: Abnormal   Collection Time: 04/22/23 12:09 AM  Result Value Ref Range   WBC 13.1 (H) 4.0 - 10.5 K/uL   RBC 3.72 (L) 3.87 - 5.11 MIL/uL   Hemoglobin 9.5 (L) 12.0 - 15.0 g/dL   HCT 70.3 (L) 63.9 - 53.9 %   MCV 79.6 (L) 80.0 - 100.0 fL   MCH 25.5 (L) 26.0 - 34.0 pg   MCHC 32.1 30.0 - 36.0 g/dL   RDW 85.1 88.4 - 84.4 %   Platelets 326 150 - 400 K/uL   nRBC 0.7 (H) 0.0 - 0.2 %    Comment: Performed at [facility], [address] [place], [place] [address]  Glucose, capillary     Status: None   Collection Time: 04/22/23  6:27 AM  Result Value Ref Range   Glucose-Capillary 97 70 - 99 mg/dL    Comment: Glucose reference range applies  only to samples taken after fasting for at least 8 hours.    No results found.   Current scheduled medications  aspirin  EC  81 mg Oral QHS   vitamin B-12  1,000 mcg Oral Daily   ferrous sulfate   325 mg Oral QODAY   insulin  aspart  0-12 Units Subcutaneous Q0600   insulin  aspart  0-16 Units Subcutaneous TID PC   mometasone -formoterol   2 puff Inhalation BID   prenatal multivitamin  1 tablet Oral Q1200   sodium chloride  flush  3 mL Intravenous Q12H    I have reviewed the patient's current medications.  ASSESSMENT: Principal Problem:   Placenta previa antepartum in third trimester Active Problems:   GDM (gestational diabetes mellitus)   PLAN: Patient completed  BMZ & mag NICU consult appreciated F/u DM coordinator recs- CBGs well controlled for the most part. Patient not fasting overnight- discussed with patient need to fast for 8  hours Encourage po hydration Continue routine antenatal care. Continue inpatient monitoring due to bleeding previa  Winton Felt, MD Obstetrician & Gynecologist, [facility] for [employer], [facility]

## 2023-04-22 NOTE — TOC Transition Note (Signed)
 Transition of Care [facility]) - Discharge Note   Patient Details  Name: Victoria Cline MRN: [id number] Date of Birth: [birth year]  [facility] of [facility] CM/SW Contact:  Charlene Julian Daring, RN Phone Number: 04/22/2023, 3:54 PM   Clinical Narrative:             Patient Goals and CMS Choice    Victoria Cline is a 33 y.o. G3P0020 at [redacted]w[redacted]d who is admitted for placenta previa - bleed #2.  Estimated Date of Delivery: 07/02/23       CM spoke to patient and she does not have PCP and she wanted CM to check Dr. WENDI Reese and CM called their office and spoke to staff and they are not in contract with patient's primary insurance. CM shared info with patient and she did not have preference after that and CM was able to get an appointment on 07/02/23 at 0800 with Dr. Tanda.  Prior to that after discharge patient will follow up with the OBGYN clinic.  Patient had some other questions regarding medications coverage. CM shared info with Reyes Fenton- advocate team specialist and he shared that primary insurance was covering the medicines and she was paying co pays but the medicines was not in formulary with her secondary insurance and that is why they did not pick up the copay. CM shared this information with patient and also shared this with primary OB doctor to see if any medications out patient could be changed to a formulary drug moving forward. No barriers with transportation.   CSW also seeing patient and following patient.           Social Drivers of Health (SDOH) Interventions SDOH Screenings   Food Insecurity: No Food Insecurity (04/16/2023)  Housing: Low Risk  (04/16/2023)  Transportation Needs: No Transportation Needs (04/16/2023)  Utilities: Not At Risk (04/16/2023)  Depression (PHQ2-9): Medium Risk (04/08/2023)  Social Connections: Unknown (08/24/2021)   Received from [facility]  Tobacco Use: High Risk (04/16/2023)     Readmission Risk Interventions      No data to display

## 2023-04-22 NOTE — Progress Notes (Signed)
 Pt off unit at this time. Will try NST later

## 2023-04-22 NOTE — Progress Notes (Signed)
 Pt off the unit at this time. Will try NST again later

## 2023-04-22 NOTE — Progress Notes (Signed)
 Pt refused fetal monitoring at this time.

## 2023-04-23 ENCOUNTER — Ambulatory Visit: Payer: No Typology Code available for payment source

## 2023-04-23 ENCOUNTER — Encounter (HOSPITAL_COMMUNITY): Payer: Self-pay | Admitting: Family Medicine

## 2023-04-23 ENCOUNTER — Other Ambulatory Visit: Payer: Self-pay

## 2023-04-23 ENCOUNTER — Encounter (HOSPITAL_COMMUNITY)
Admission: AD | Disposition: A | Discharge status: Home / Self Care | Payer: Self-pay | Source: Home / Self Care | Attending: Family Medicine

## 2023-04-23 ENCOUNTER — Inpatient Hospital Stay (HOSPITAL_COMMUNITY): Payer: No Typology Code available for payment source | Admitting: Anesthesiology

## 2023-04-23 DIAGNOSIS — Z3A30 30 weeks gestation of pregnancy: Secondary | ICD-10-CM

## 2023-04-23 DIAGNOSIS — Z98891 History of uterine scar from previous surgery: Secondary | ICD-10-CM

## 2023-04-23 DIAGNOSIS — O4420 Partial placenta previa NOS or without hemorrhage, unspecified trimester: Secondary | ICD-10-CM

## 2023-04-23 DIAGNOSIS — O24424 Gestational diabetes mellitus in childbirth, insulin controlled: Secondary | ICD-10-CM

## 2023-04-23 DIAGNOSIS — O4403 Placenta previa specified as without hemorrhage, third trimester: Secondary | ICD-10-CM

## 2023-04-23 DIAGNOSIS — O4593 Premature separation of placenta, unspecified, third trimester: Secondary | ICD-10-CM

## 2023-04-23 LAB — CBC
HCT: 31 % — ABNORMAL LOW (ref 36.0–46.0)
Hemoglobin: 10 g/dL — ABNORMAL LOW (ref 12.0–15.0)
MCH: 25.4 pg — ABNORMAL LOW (ref 26.0–34.0)
MCHC: 32.3 g/dL (ref 30.0–36.0)
MCV: 78.9 fL — ABNORMAL LOW (ref 80.0–100.0)
Platelets: 333 10*3/uL (ref 150–400)
RBC: 3.93 MIL/uL (ref 3.87–5.11)
RDW: 14.8 % (ref 11.5–15.5)
WBC: 12.1 10*3/uL — ABNORMAL HIGH (ref 4.0–10.5)
nRBC: 0.4 % — ABNORMAL HIGH (ref 0.0–0.2)

## 2023-04-23 LAB — GLUCOSE, CAPILLARY
Glucose-Capillary: 89 mg/dL (ref 70–99)
Glucose-Capillary: 106 mg/dL — ABNORMAL HIGH (ref 70–99)

## 2023-04-23 LAB — PREPARE RBC (CROSSMATCH)

## 2023-04-23 SURGERY — Surgical Case
Anesthesia: Spinal | Site: Abdomen

## 2023-04-23 MED ORDER — MORPHINE SULFATE (PF) 0.5 MG/ML IJ SOLN
INTRAMUSCULAR | Status: DC | PRN
  Administered 2023-04-23: .15 mg via INTRATHECAL

## 2023-04-23 MED ORDER — SENNOSIDES-DOCUSATE SODIUM 8.6-50 MG PO TABS
2.0000 | ORAL_TABLET | Freq: Every day | ORAL | Status: DC
  Administered 2023-04-24 – 2023-04-25 (×2): 2 via ORAL
  Filled 2023-04-23 (×2): qty 2

## 2023-04-23 MED ORDER — WITCH HAZEL-GLYCERIN EX PADS: 1.0000 | MEDICATED_PAD | CUTANEOUS | Status: DC | PRN

## 2023-04-23 MED ORDER — ENOXAPARIN SODIUM 40 MG/0.4ML IJ SOSY
40.0000 mg | PREFILLED_SYRINGE | INTRAMUSCULAR | Status: DC
  Administered 2023-04-23 – 2023-04-25 (×3): 40 mg via SUBCUTANEOUS
  Filled 2023-04-23 (×3): qty 0.4

## 2023-04-23 MED ORDER — MORPHINE SULFATE (PF) 0.5 MG/ML IJ SOLN
INTRAMUSCULAR | Status: AC
  Filled 2023-04-23: qty 10

## 2023-04-23 MED ORDER — FENTANYL CITRATE (PF) 100 MCG/2ML IJ SOLN
INTRAMUSCULAR | Status: AC
  Filled 2023-04-23: qty 2

## 2023-04-23 MED ORDER — COCONUT OIL OIL: 1.0000 | TOPICAL_OIL | Status: DC | PRN

## 2023-04-23 MED ORDER — OXYTOCIN-SODIUM CHLORIDE 30-0.9 UT/500ML-% IV SOLN: 2.5000 [IU]/h | INTRAVENOUS | Status: AC

## 2023-04-23 MED ORDER — IBUPROFEN 600 MG PO TABS
600.0000 mg | ORAL_TABLET | Freq: Four times a day (QID) | ORAL | Status: DC
  Administered 2023-04-24 – 2023-04-26 (×8): 600 mg via ORAL
  Filled 2023-04-23 (×8): qty 1

## 2023-04-23 MED ORDER — SIMETHICONE 80 MG PO CHEW
80.0000 mg | CHEWABLE_TABLET | Freq: Three times a day (TID) | ORAL | Status: DC
  Administered 2023-04-23 – 2023-04-25 (×8): 80 mg via ORAL
  Filled 2023-04-23 (×8): qty 1

## 2023-04-23 MED ORDER — PRENATAL MULTIVITAMIN CH
1.0000 | ORAL_TABLET | Freq: Every day | ORAL | Status: DC
  Administered 2023-04-24 – 2023-04-25 (×2): 1 via ORAL
  Filled 2023-04-23 (×2): qty 1

## 2023-04-23 MED ORDER — DEXAMETHASONE SODIUM PHOSPHATE 4 MG/ML IJ SOLN
INTRAMUSCULAR | Status: DC | PRN
  Administered 2023-04-23: 8 mg via INTRAVENOUS

## 2023-04-23 MED ORDER — CEFAZOLIN SODIUM-DEXTROSE 2-3 GM-%(50ML) IV SOLR
INTRAVENOUS | Status: DC | PRN
  Administered 2023-04-23: 2 g via INTRAVENOUS

## 2023-04-23 MED ORDER — KETOROLAC TROMETHAMINE 30 MG/ML IJ SOLN
30.0000 mg | Freq: Four times a day (QID) | INTRAMUSCULAR | Status: AC
  Administered 2023-04-23 – 2023-04-24 (×4): 30 mg via INTRAVENOUS
  Filled 2023-04-23 (×4): qty 1

## 2023-04-23 MED ORDER — SOD CITRATE-CITRIC ACID 500-334 MG/5ML PO SOLN: 30.0000 mL | Freq: Once | ORAL | Status: AC

## 2023-04-23 MED ORDER — MENTHOL 3 MG MT LOZG: 1.0000 | LOZENGE | OROMUCOSAL | Status: DC | PRN

## 2023-04-23 MED ORDER — ACETAMINOPHEN 500 MG PO TABS
1000.0000 mg | ORAL_TABLET | Freq: Four times a day (QID) | ORAL | Status: DC
  Administered 2023-04-23 – 2023-04-26 (×11): 1000 mg via ORAL
  Filled 2023-04-23 (×13): qty 2

## 2023-04-23 MED ORDER — BUPIVACAINE IN DEXTROSE 0.75-8.25 % IT SOLN
INTRATHECAL | Status: DC | PRN
  Administered 2023-04-23: 1.7 mL via INTRATHECAL

## 2023-04-23 MED ORDER — PHENYLEPHRINE HCL-NACL 20-0.9 MG/250ML-% IV SOLN
INTRAVENOUS | Status: DC | PRN
  Administered 2023-04-23: 60 ug/min via INTRAVENOUS

## 2023-04-23 MED ORDER — FENTANYL CITRATE (PF) 100 MCG/2ML IJ SOLN: 25.0000 ug | INTRAMUSCULAR | Status: DC | PRN

## 2023-04-23 MED ORDER — FENTANYL CITRATE (PF) 100 MCG/2ML IJ SOLN
INTRAMUSCULAR | Status: DC | PRN
  Administered 2023-04-23: 35 ug via INTRAVENOUS

## 2023-04-23 MED ORDER — DIBUCAINE (PERIANAL) 1 % EX OINT: 1.0000 | TOPICAL_OINTMENT | CUTANEOUS | Status: DC | PRN

## 2023-04-23 MED ORDER — DIPHENHYDRAMINE HCL 25 MG PO CAPS: 25.0000 mg | ORAL_CAPSULE | Freq: Four times a day (QID) | ORAL | Status: DC | PRN

## 2023-04-23 MED ORDER — FENTANYL CITRATE (PF) 100 MCG/2ML IJ SOLN
INTRAMUSCULAR | Status: DC | PRN
  Administered 2023-04-23: 15 ug via INTRATHECAL

## 2023-04-23 MED ORDER — ONDANSETRON HCL 4 MG/2ML IJ SOLN
INTRAMUSCULAR | Status: DC | PRN
  Administered 2023-04-23: 4 mg via INTRAVENOUS

## 2023-04-23 MED ORDER — LACTATED RINGERS IV SOLN: INTRAVENOUS | Status: DC

## 2023-04-23 MED ORDER — SODIUM CHLORIDE 0.9% IV SOLUTION
Freq: Once | INTRAVENOUS | Status: DC
Start: 2023-04-23 — End: 2023-04-23

## 2023-04-23 MED ORDER — OXYTOCIN-SODIUM CHLORIDE 30-0.9 UT/500ML-% IV SOLN
INTRAVENOUS | Status: DC | PRN
  Administered 2023-04-23 (×2): 100 mL via INTRAVENOUS

## 2023-04-23 MED ORDER — SODIUM CHLORIDE 0.9 % IR SOLN
Status: DC | PRN
  Administered 2023-04-23: 1

## 2023-04-23 MED ORDER — OXYCODONE HCL 5 MG PO TABS
5.0000 mg | ORAL_TABLET | ORAL | Status: DC | PRN
  Administered 2023-04-23 – 2023-04-25 (×6): 10 mg via ORAL
  Filled 2023-04-23 (×6): qty 2

## 2023-04-23 MED ORDER — SOD CITRATE-CITRIC ACID 500-334 MG/5ML PO SOLN
ORAL | Status: AC
  Administered 2023-04-23: 30 mL
  Filled 2023-04-23: qty 30

## 2023-04-23 MED ORDER — LACTATED RINGERS IV BOLUS
500.0000 mL | Freq: Once | INTRAVENOUS | Status: AC
  Administered 2023-04-23: 500 mL via INTRAVENOUS

## 2023-04-23 MED ORDER — SIMETHICONE 80 MG PO CHEW: 80.0000 mg | CHEWABLE_TABLET | ORAL | Status: DC | PRN

## 2023-04-23 MED ORDER — HYDROMORPHONE HCL 1 MG/ML IJ SOLN
0.2000 mg | INTRAMUSCULAR | Status: DC | PRN
Start: 2023-04-23 — End: 2023-04-26
  Administered 2023-04-24 – 2023-04-25 (×4): 0.6 mg via INTRAVENOUS
  Filled 2023-04-23 (×4): qty 1

## 2023-04-23 MED ORDER — TRANEXAMIC ACID 1000 MG/10ML IV SOLN
INTRAVENOUS | Status: DC | PRN
  Administered 2023-04-23: 1000 mg via INTRAVENOUS

## 2023-04-23 MED ORDER — DIPHENHYDRAMINE HCL 50 MG/ML IJ SOLN
INTRAMUSCULAR | Status: DC | PRN
  Administered 2023-04-23: 25 mg via INTRAVENOUS

## 2023-04-23 MED ORDER — ZOLPIDEM TARTRATE 5 MG PO TABS
5.0000 mg | ORAL_TABLET | Freq: Every evening | ORAL | Status: DC | PRN
  Administered 2023-04-23 – 2023-04-24 (×2): 5 mg via ORAL
  Filled 2023-04-23 (×2): qty 1

## 2023-04-23 MED ORDER — MEPERIDINE HCL 25 MG/ML IJ SOLN: 6.2500 mg | INTRAMUSCULAR | Status: DC | PRN

## 2023-04-23 MED ORDER — STERILE WATER FOR IRRIGATION IR SOLN
Status: DC | PRN
  Administered 2023-04-23: 1000 mL

## 2023-04-23 SURGICAL SUPPLY — 29 items
BENZOIN TINCTURE PRP APPL 2/3 (GAUZE/BANDAGES/DRESSINGS) ×1 IMPLANT
CANISTER SUCT 3000ML PPV (MISCELLANEOUS) ×1 IMPLANT
CHLORAPREP W/TINT 26 (MISCELLANEOUS) ×2 IMPLANT
CLAMP UMBILICAL CORD (MISCELLANEOUS) ×1 IMPLANT
DRSG OPSITE POSTOP 4X10 (GAUZE/BANDAGES/DRESSINGS) ×1 IMPLANT
ELECT REM PT RETURN 9FT ADLT (ELECTROSURGICAL) ×1
ELECTRODE REM PT RTRN 9FT ADLT (ELECTROSURGICAL) ×1 IMPLANT
EXTRACTOR VACUUM KIWI (MISCELLANEOUS) ×1 IMPLANT
GAUZE SPONGE 4X4 12PLY STRL LF (GAUZE/BANDAGES/DRESSINGS) IMPLANT
GLOVE BIOGEL PI IND STRL 7.0 (GLOVE) ×2 IMPLANT
GLOVE BIOGEL PI IND STRL 7.5 (GLOVE) ×1 IMPLANT
GLOVE SURG SS PI 7.0 STRL IVOR (GLOVE) ×1 IMPLANT
GOWN STRL REUS W/ TWL LRG LVL3 (GOWN DISPOSABLE) ×2 IMPLANT
GOWN STRL REUS W/ TWL XL LVL3 (GOWN DISPOSABLE) ×1 IMPLANT
NS IRRIG 1000ML POUR BTL (IV SOLUTION) ×1 IMPLANT
PACK C SECTION WH (CUSTOM PROCEDURE TRAY) ×1 IMPLANT
PAD ABD 7.5X8 STRL (GAUZE/BANDAGES/DRESSINGS) ×1 IMPLANT
PAD OB MATERNITY 4.3X12.25 (PERSONAL CARE ITEMS) ×1 IMPLANT
PAD PREP 24X48 CUFFED NSTRL (MISCELLANEOUS) ×1 IMPLANT
RETRACTOR WND ALEXIS 25 LRG (MISCELLANEOUS) ×1 IMPLANT
RTRCTR WOUND ALEXIS 25CM LRG (MISCELLANEOUS) ×1
STRIP CLOSURE SKIN 1/2X4 (GAUZE/BANDAGES/DRESSINGS) ×1 IMPLANT
SUT MNCRL 0 VIOLET CTX 36 (SUTURE) ×2 IMPLANT
SUT MON AB 4-0 PS1 27 (SUTURE) ×1 IMPLANT
SUT PLAIN 2 0 XLH (SUTURE) ×1 IMPLANT
SUT VIC AB 0 CT1 36 (SUTURE) ×2 IMPLANT
SUT VIC AB 3-0 CT1 TAPERPNT 27 (SUTURE) ×1 IMPLANT
TOWEL OR 17X24 6PK STRL BLUE (TOWEL DISPOSABLE) ×2 IMPLANT
WATER STERILE IRR 1000ML POUR (IV SOLUTION) ×1 IMPLANT

## 2023-04-23 NOTE — Progress Notes (Signed)
 OB Note  Patient now endorsing regular UCs which we are seeing on the monitor, and she states they are painful. Baby category I with accels and UCs q2-43m.  H/H stable at 10, but pad and introitus has blood on them for probably about another .   Given now feeling painful UCs with vaginal bleeding, likely abrupting. Given this and continued bleeding, recommend c/s, which she is amenable to.  OR called  Victoria Izell Raddle MD Attending Center for [employer] (Faculty Practice) 04/23/2023 Time: [phone]

## 2023-04-23 NOTE — Lactation Note (Signed)
 This note was copied from a baby's chart. Lactation Consultation Note  Patient Name: Victoria Cline Unijb'd Date: 04/23/2023 Age:31 hours   LC has attempted twice to visit with P1 Mom of preterm baby in the NICU.  Twice in her room and once in baby's room.  LC noted that a DEBP was set up in the room and Mom had pumped once, per support person in the room.  LC set up washing and drying bins and wrote a plan on the dry erase board.  LC will F/U later to educate on pumping and collecting colostrum for her baby.  [name] RN IBCLC 04/23/2023, 3:48 PM

## 2023-04-23 NOTE — Inpatient Diabetes Management (Signed)
 Inpatient Diabetes Program Recommendations  AACE/ADA: New Consensus Statement on Inpatient Glycemic Control (2015)  Target Ranges:  Prepandial:   less than 140 mg/dL      Peak postprandial:   less than 180 mg/dL (1-2 hours)      Critically ill patients:  140 - 180 mg/dL   Lab Results  Component Value Date   GLUCAP 89 04/23/2023   HGBA1C 5.5 12/23/2022    Review of Glycemic Control  Diabetes history: A2GDM Outpatient Diabetes medications: metformin  500 mg bid Current orders for Inpatient glycemic control:  Novolog  0-12 units Daily Novolog  0-16 units tid after meals  Inpatient Diabetes Program Recommendations:    Note Pt received decadron  8 mg during procedure. Post delivery adjust insulin  settings  -   d/c current insulin  orders -   use glycemic control order set -   Check CBGs tid and hs -   select Novolog  0-9 units tid + hs  Thanks,  Clotilda Bull RN, MSN, BC-ADM Inpatient Diabetes Coordinator Team Pager [phone] (8a-5p)

## 2023-04-23 NOTE — Anesthesia Postprocedure Evaluation (Signed)
 Anesthesia Post Note  Patient: Victoria Cline  Procedure(s) Performed: CESAREAN SECTION (Abdomen)     Patient location during evaluation: PACU Anesthesia Type: Spinal Level of consciousness: oriented and awake and alert Pain management: pain level controlled Vital Signs Assessment: post-procedure vital signs reviewed and stable Respiratory status: spontaneous breathing, respiratory function stable and nonlabored ventilation Cardiovascular status: blood pressure returned to baseline and stable Postop Assessment: no headache, no backache, no apparent nausea or vomiting, spinal receding and patient able to bend at knees Anesthetic complications: no   No notable events documented.  Last Vitals:  Vitals:   04/23/23 0615 04/23/23 0630  BP: 122/74 133/72  Pulse: 71 85  Resp: 15 17  Temp:    SpO2: 100% 98%    Last Pain:  Vitals:   04/23/23 0630  TempSrc:   PainSc: 0-No pain   Pain Goal: Patients Stated Pain Goal: 3 (04/22/23 2010)  LLE Motor Response: Purposeful movement (04/23/23 0630) LLE Sensation: Increased (04/23/23 0630) RLE Motor Response: Purposeful movement (04/23/23 0630) RLE Sensation: Increased (04/23/23 0630)     Epidural/Spinal Function Cutaneous sensation: Able to Wiggle Toes (04/23/23 0630), Patient able to flex knees: No (04/23/23 0630), Patient able to lift hips off bed: No (04/23/23 0630), Back pain beyond tenderness at insertion site: No (04/23/23 0630), Progressively worsening motor and/or sensory loss: No (04/23/23 0630), Bowel and/or bladder incontinence post epidural: No (04/23/23 0630)  Timathy Newberry A.

## 2023-04-23 NOTE — Anesthesia Preprocedure Evaluation (Signed)
 Anesthesia Evaluation  Patient identified by MRN, date of birth, ID band Patient awake    Reviewed: Allergy & Precautions, NPO status , Patient's Chart, lab work & pertinent test results  Airway Mallampati: II  TM Distance: >3 FB     Dental no notable dental hx. (+) Teeth Intact, Dental Advisory Given   Pulmonary asthma , Current Smoker and Patient abstained from smoking.   Pulmonary exam normal breath sounds clear to auscultation       Cardiovascular negative cardio ROS Normal cardiovascular exam Rhythm:Regular Rate:Normal     Neuro/Psych  PSYCHIATRIC DISORDERS Anxiety     negative neurological ROS     GI/Hepatic Neg liver ROS,GERD  ,,  Endo/Other  diabetes, Well Controlled, Gestational    Renal/GU negative Renal ROS  negative genitourinary   Musculoskeletal negative musculoskeletal ROS (+)    Abdominal   Peds  Hematology  (+) Blood dyscrasia, anemia   Anesthesia Other Findings   Reproductive/Obstetrics (+) Pregnancy 29 6/7 weeks Painful uterine contractions Suspected placental abruption Placenta previa                              Anesthesia Physical Anesthesia Plan  ASA: 3 and emergent  Anesthesia Plan: Spinal   Post-op Pain Management: Regional block* and Minimal or no pain anticipated   Induction: Intravenous  PONV Risk Score and Plan: 4 or greater and Treatment may vary due to age or medical condition and Scopolamine patch - Pre-op  Airway Management Planned: Natural Airway  Additional Equipment: None and Fetal Monitoring  Intra-op Plan:   Post-operative Plan:   Informed Consent: I have reviewed the patients History and Physical, chart, labs and discussed the procedure including the risks, benefits and alternatives for the proposed anesthesia with the patient or authorized representative who has indicated his/her understanding and acceptance.     Dental advisory  given  Plan Discussed with: CRNA and Anesthesiologist  Anesthesia Plan Comments:          Anesthesia Quick Evaluation

## 2023-04-23 NOTE — Lactation Note (Signed)
 This note was copied from a baby's chart.  NICU Lactation Consultation Note  Patient Name: Victoria Cline Date: 04/23/2023 Age:31 hours  Reason for consult: Initial assessment; NICU baby; Primapara; 1st time breastfeeding; Maternal endocrine disorder; Preterm <34wks; Infant < 6lbs Type of Endocrine Disorder?: Diabetes (GDM)  SUBJECTIVE  LC in to visit with P1 Mom of preterm infant [name] in the NICU.  Mom initiated double pumping at 4 hrs post delivery.  Mom encouraged to continue her consistent pumping every 3 hrs to support her milk supply.  Reassured her that expressing volume in the bottles is not to be expected a few days.  Encouraged breast massage along with double pumping.  LC set up a washing and drying bin and provided education on how to properly clean, rinse and air dry pump parts.    Mom reports she doesn't have a pump at home, but does have WIC.  [facility] referral sent.  Mom made aware of [facility] loaner available to her since she is active on [facility] and may not get in to receive pump on day of discharge.  OBJECTIVE Infant data: No data recorded O2 Device: CPAP FiO2 (%): 21 %  Infant feeding assessment No data recorded  Maternal data: [id number] C-Section, Low Transverse Has patient been taught Hand Expression?: Yes Hand Expression Comments: drops noted Significant Breast History:: ++ breast changes Current breast feeding challenges:: infant separation Does the patient have breastfeeding experience prior to this delivery?: No Pumping frequency: Initiated pumping at 4 hrs post delivery Pumped volume: 0 mL (drop) Flange Size: 18 Hands-free pumping top sizes: Small/Medium (Blue) Risk factor for low/delayed milk supply:: infant separation, admission to NICU  WIC Program: Yes WIC Referral Sent?: Yes What county?: [place]  ASSESSMENT Infant:  No data recorded Maternal: Milk volume: Normal  INTERVENTIONS/PLAN Interventions: Interventions: Breast feeding  basics reviewed; Skin to skin; Breast massage; Hand express; DEBP; Education; CDC Guidelines for Breast Pump Cleaning; LC Services brochure Tools: Pump; Flanges; Hands-free pumping top Pump Education: Milk Storage; Setup, frequency, and cleaning  Plan: 1- STS with baby when able to 2- Massage breasts and hand express often 3- Pump both breasts on initiation setting for 15 mins every 2-3 hrs/day and 3-4 hrs/night.  Consult Status: NICU follow-up NICU Follow-up type: New admission follow up   [name] 04/23/2023, 5:37 PM

## 2023-04-23 NOTE — Progress Notes (Addendum)
 OB Note CTSP for VB  Patient went to the bathroom to void and had passage of clots and bleeding. Approximately in toilet hat and about another 80mL on pad. Baby is category I and appropriate for GA. Patient denies any pain and no contractions seen on toco.   New pad clean and dry. Abdomen, soft, nttp and VS normal and stable. T&S UTD and repeat CBC ordered. Last PO of snacks at 2200. Patient made NPO and 500mL bolus and MIVF ordered.      Latest Ref Rng & Units 04/22/2023   12:09 AM 04/21/2023    1:30 AM 04/19/2023    1:20 AM  CBC  WBC 4.0 - 10.5 K/uL 13.1  13.3  12.3   Hemoglobin 12.0 - 15.0 g/dL 9.5  89.7  89.6   Hematocrit 36.0 - 46.0 % 29.6  32.2  32.3   Platelets 150 - 400 K/uL 326  342  344      I told her if VB continues then she would need delivery via c-section, which she understands. Patient consented. I also asked her if she'd be okay with a blood transfusion, if we thought it necessary, and she said that she is accepting. 2u PRBC crossmatch ordered.   Bebe Izell Raddle MD Attending Center for [employer] (Faculty Practice) 04/23/2023 Time: [phone]

## 2023-04-23 NOTE — Transfer of Care (Signed)
 Immediate Anesthesia Transfer of Care Note  Patient: Victoria Cline  Procedure(s) Performed: CESAREAN SECTION (Abdomen)  Patient Location: PACU  Anesthesia Type:Spinal  Level of Consciousness: awake, alert , and oriented  Airway & Oxygen Therapy: Patient Spontanous Breathing  Post-op Assessment: Report given to RN and Post -op Vital signs reviewed and stable  Post vital signs: Reviewed and stable  Last Vitals:  Vitals Value Taken Time  BP    Temp    Pulse 96 04/23/23 0605  Resp 17 04/23/23 0605  SpO2 100 % 04/23/23 0605  Vitals shown include unfiled device data.  Last Pain:  Vitals:   04/22/23 2340  TempSrc: Oral  PainSc:       Patients Stated Pain Goal: 3 (04/22/23 2010)  Complications: No notable events documented.

## 2023-04-23 NOTE — Op Note (Signed)
 Operative Note   SURGERY DATE: 04/23/2023  PRE-OP DIAGNOSIS:  *Pregnancy at 30/0 *Vaginal bleeding with a known previa *Uterine contractions *Concern for abruption  POST-OP DIAGNOSIS: Same. Delivered. Preterm labor   PROCEDURE: Urgent primary low transverse cesarean section via pfannenstiel skin incision with double layer uterine closure  SURGEON: Surgeons and Role:    * Izell Harari, MD - Primary  ASSISTANT:    DEWAINE Erik Kieth JAYSON, MD - Assisting  An experienced assistant was required given the standard of surgical care given the complexity of the case.  This assistant was needed for exposure, dissection, suctioning, retraction, instrument exchange, assisting with delivery with administration of fundal pressure, and for overall help during the procedure.  ANESTHESIA: spinal  ESTIMATED BLOOD LOSS:   DRAINS: UOP via indwelling foley  TOTAL IV FLUIDS: crystalloid  VTE PROPHYLAXIS: SCDs to bilateral lower extremities  ANTIBIOTICS: Two grams of Cefazolin  were given., within 1 hour of skin incision  SPECIMENS: placenta to pathology  COMPLICATIONS: none  FINDINGS: No intra-abdominal adhesions were noted. Grossly normal uterus, tubes and ovaries. Thinned and elongated lower uterine segment consistent with labor. Clear amniotic fluid, cephalic, female infant, weight 7969hf, APGARs 8/9, intact placenta.  PROCEDURE IN DETAIL: The patient was taken to the operating room where anesthesia was administered and normal fetal heart tones were confirmed. She was then prepped and draped in the normal fashion in the dorsal supine position with a leftward tilt.  After a time out was performed, a pfannensteil skin incision was made with the scalpel and carried through to the underlying layer of fascia. The fascia was then incised at the midline and this incision was extended laterally with the mayo scissors. Attention was turned to the superior aspect of the fascial incision  which was grasped with the kocher clamps x 2, tented up and the rectus muscles were dissected off bluntly. In a similar fashion the inferior aspect of the fascial incision was grasped with the kocher clamps, tented up and the rectus muscles dissected off with the mayo scissors. The rectus muscles were then separated in the midline and the peritoneum was entered bluntly. The bladder blade was inserted and the vesicouterine peritoneum was identified, tented up and entered with the metzenbaum scissors. This incision was extended laterally and the bladder flap was created digitally. The bladder blade was reinserted.  A low transverse hysterotomy was made with the scalpel until the endometrial cavity was breached and the amniotic sac ruptured, yielding clear amniotic fluid. This incision was extended bluntly and the infant's head, shoulders and body were delivered atraumatically.The cord was clamped x 2 and cut, and the infant was handed to the awaiting pediatricians, after delayed cord clamping was don.  The placenta was then gradually expressed from the uterus and then the uterus was exteriorized and cleared of all clots and debris. The hysterotomy was repaired with a running suture of 1-0 monocryl. A second imbricating layer of 1-0 monocryl suture was then placed to achieve excellent hemostasis.   The uterus and adnexa were then returned to the abdomen, and the hysterotomy and all operative sites were reinspected and excellent hemostasis was noted after irrigation and suction of the abdomen with warm saline.  The peritoneum was closed with a running stitch of 3-0 Vicryl. The fascia was reapproximated with 0 Vicryl in a simple running fashion bilaterally. The subcutaneous layer was then reapproximated with interrupted sutures of 2-0 plain gut, and the skin was then closed with 4-0 monocryl, in a subcuticular  fashion.  The patient  tolerated the procedure well. Sponge, lap, needle, and instrument counts were  correct x 2. The patient was transferred to the recovery room awake, alert and breathing independently in stable condition.  Bebe Izell Overcast MD Attending Center for [facility])

## 2023-04-23 NOTE — Anesthesia Procedure Notes (Signed)
 Spinal  Patient location during procedure: OR Start time: 04/23/2023 4:56 AM End time: 04/23/2023 4:58 AM Reason for block: surgical anesthesia Staffing Performed: anesthesiologist  Anesthesiologist: Jerrye Sharper, MD Performed by: Jerrye Sharper, MD Authorized by: Jerrye Sharper, MD   Preanesthetic Checklist Completed: patient identified, IV checked, site marked, risks and benefits discussed, surgical consent, monitors and equipment checked, pre-op evaluation and timeout performed Spinal Block Patient position: sitting Prep: DuraPrep and site prepped and draped Patient monitoring: heart rate, cardiac monitor, continuous pulse ox and blood pressure Approach: midline Location: L3-4 Injection technique: single-shot Needle Needle type: Pencan  Needle gauge: 24 G Needle length: 9 cm Needle insertion depth: 5 cm Assessment Sensory level: T4 Events: CSF return Additional Notes Patient tolerated procedure well. Adequate sensory level.

## 2023-04-23 NOTE — Discharge Summary (Addendum)
 Postpartum Discharge Summary  Date of Service updated    Patient Name: Victoria Cline DOB: 1990/07/28 MRN: [id number]  Date of admission: 04/16/2023 Delivery date:04/23/2023 Delivering provider: IZELL HARARI Date of discharge: 04/26/2023  Admitting diagnosis: Placenta previa antepartum in third trimester [O44.03] Intrauterine pregnancy: [redacted]w[redacted]d     Secondary diagnosis:  Principal Problem:   Placenta previa antepartum in third trimester Active Problems:   GDM (gestational diabetes mellitus)   S/P cesarean section   Acute blood loss anemia  Additional problems: None    Discharge diagnosis: Preterm Pregnancy Delivered and GDM A2                                              Post partum procedures:blood transfusion Augmentation: N/A Complications: None  Hospital course: Onset of Labor With Unplanned C/S   33 y.o. yo [id number] at [redacted]w[redacted]d was admitted for 2nd vaginal bleed with placenta previa on 04/16/2023. On 1/10, patient started having significant bleeding and cramping and decision was made to proceed with delivery. She had previously received BMZ and magnesium  on 12/28-29. The patient went for cesarean section due to  placenta previa . Delivery details as follows: Membrane Rupture Time/Date:  ,   Delivery Method:C-Section, Low Transverse Operative Delivery:N/A Details of operation can be found in separate operative note. Patient had a postpartum course complicated by postop anemia. She was given 2 units of PRBC which brought up her hemoglobin appropriately.  She also had postop constipation that was ultimately aided with enema.  She is ambulating,tolerating a regular diet, passing flatus, and urinating well.  Patient is discharged home in stable condition 04/26/23.  Newborn Data: Birth date:04/23/2023 Birth time:5:14 AM Gender:Female Living status:Living Apgars:8 ,9  Weight:2030 g  Magnesium  Sulfate received: Yes: Neuroprotection BMZ received:  Yes Rhophylac:N/A MMR:N/A T-DaP:Given prenatally Flu: No RSV Vaccine received: No Transfusion:Yes - 2 units  Immunizations received: Immunization History  Administered Date(s) Administered   Tdap 06/09/2022, 04/08/2023    Physical exam  Vitals:   04/25/23 1211 04/25/23 2022 04/26/23 0526 04/26/23 0808  BP: 127/71 133/75 132/72 130/81  Pulse: 92 (!) 106 98 (!) 107  Resp: 19 18 20 18   Temp: 98.1 F (36.7 C) 98.3 F (36.8 C) 97.8 F (36.6 C) 98.1 F (36.7 C)  TempSrc: Oral Oral Oral   SpO2: 100% 99% 98%   Weight:      Height:       General: alert, cooperative, and no distress Lochia: appropriate Uterine Fundus: firm Incision: Healing well with no significant drainage DVT Evaluation: No evidence of DVT seen on physical exam. Labs: Lab Results  Component Value Date   WBC 13.7 (H) 04/25/2023   HGB 9.7 (L) 04/25/2023   HCT 29.3 (L) 04/25/2023   MCV 80.1 04/25/2023   PLT 260 04/25/2023      Latest Ref Rng & Units 04/25/2023    8:15 AM  CMP  Glucose 70 - 99 mg/dL 86   BUN 6 - 20 mg/dL 10   Creatinine 9.55 - 1.00 mg/dL 9.49   Sodium 864 - 854 mmol/L 135   Potassium 3.5 - 5.1 mmol/L 4.2   Chloride 98 - 111 mmol/L 104   CO2 22 - 32 mmol/L 23   Calcium  8.9 - 10.3 mg/dL 8.7   Total Protein 6.5 - 8.1 g/dL 5.0   Total Bilirubin 0.0 - 1.2 mg/dL 0.6  Alkaline Phos 38 - 126 U/L 50   AST 15 - 41 U/L 20   ALT 0 - 44 U/L 18    Edinburgh Score:    04/25/2023    9:00 AM  Edinburgh Postnatal Depression Scale Screening Tool  I have been able to laugh and see the funny side of things. 0  I have looked forward with enjoyment to things. 0  I have blamed myself unnecessarily when things went wrong. 1  I have been anxious or worried for no good reason. 1  I have felt scared or panicky for no good reason. 2  Things have been getting on top of me. 0  I have been so unhappy that I have had difficulty sleeping. 0  I have felt sad or miserable. 0  I have been so unhappy that I  have been crying. 0  The thought of harming myself has occurred to me. 0  Edinburgh Postnatal Depression Scale Total 4   Edinburgh Postnatal Depression Scale Total: 4   After visit meds:  Allergies as of 04/26/2023   No Known Allergies      Medication List     STOP taking these medications    aspirin  EC 81 MG tablet   cyclobenzaprine  10 MG tablet Commonly known as: FLEXERIL    cyclobenzaprine  5 MG tablet Commonly known as: FLEXERIL    docusate sodium  100 MG capsule Commonly known as: COLACE   metFORMIN  500 MG tablet Commonly known as: GLUCOPHAGE        TAKE these medications    acetaminophen  500 MG tablet Commonly known as: TYLENOL  Take 2 tablets (1,000 mg total) by mouth every 6 (six) hours. What changed:  how much to take when to take this reasons to take this   albuterol  108 (90 Base) MCG/ACT inhaler Commonly known as: VENTOLIN  HFA Inhale 2 puffs into the lungs every 6 (six) hours as needed for wheezing or shortness of breath.   budesonide -formoterol  80-4.5 MCG/ACT inhaler Commonly known as: Symbicort  Inhale 2 puffs into the lungs in the morning and at bedtime.   gabapentin  300 MG capsule Commonly known as: NEURONTIN  Take 1 capsule (300 mg total) by mouth 3 (three) times daily.   ibuprofen  600 MG tablet Commonly known as: ADVIL  Take 1 tablet (600 mg total) by mouth every 6 (six) hours.   Oxycodone  HCl 10 MG Tabs Take 1-1.5 tablets (10-15 mg total) by mouth every 4 (four) hours as needed for moderate pain (pain score 4-6).   polyethylene glycol powder 17 GM/SCOOP powder Commonly known as: GLYCOLAX /MIRALAX  Take 1 capful (17 g) with water  by mouth daily.   Prenatal Vitamin 27-0.8 MG Tabs Take 1 tablet by mouth daily.   senna-docusate 8.6-50 MG tablet Commonly known as: Senokot-S Take 2 tablets by mouth daily.   simethicone  80 MG chewable tablet Commonly known as: MYLICON Chew 1 tablet (80 mg total) by mouth 3 (three) times daily after meals.          Discharge home in stable condition Infant Feeding:  NICU Infant Disposition:NICU Discharge instruction: per After Visit Summary and Postpartum booklet. Activity: Advance as tolerated. Pelvic rest for 6 weeks.  Diet: routine diet Future Appointments: Future Appointments  Date Time Provider Department Center  04/30/2023 10:45 AM [facility] NURSE [facility]  06/01/2023  9:00 AM [facility] LAB [facility]  06/01/2023  9:35 AM Izell Harari, MD [facility]  07/02/2023  8:00 AM Tanda Bleacher, MD [facility] None   Follow up Visit:  Follow-up Information  [facility] Follow up on 07/02/2023.   Why: appointment is on 07/02/23 at 8:00 am. appointment is with Dr. Tanda for PCP- primary care doctor after delivery. Contact information: [address], [address] [place]  [id number]        [facility] Follow up in 1 week(s).   Specialty: Obstetrics and Gynecology Contact information: [address] [place]  [address] [phone]                Message sent 1/10 by Erik  Please schedule this patient for a In person postpartum visit in 4-6 weeks with the following provider: Any provider. Additional Postpartum F/U:2 hour GTT  High risk pregnancy complicated by: GDM & placenta previa Delivery mode:  Cesarean Anticipated Birth Control:  Unsure   04/26/2023 Gloris Hugger, MD

## 2023-04-24 LAB — CBC
HCT: 21.2 % — ABNORMAL LOW (ref 36.0–46.0)
HCT: 26.8 % — ABNORMAL LOW (ref 36.0–46.0)
Hemoglobin: 6.7 g/dL — CL (ref 12.0–15.0)
Hemoglobin: 8.7 g/dL — ABNORMAL LOW (ref 12.0–15.0)
MCH: 25.3 pg — ABNORMAL LOW (ref 26.0–34.0)
MCH: 26.2 pg (ref 26.0–34.0)
MCHC: 31.6 g/dL (ref 30.0–36.0)
MCHC: 32.5 g/dL (ref 30.0–36.0)
MCV: 80 fL (ref 80.0–100.0)
MCV: 80.7 fL (ref 80.0–100.0)
Platelets: 242 10*3/uL (ref 150–400)
Platelets: 231 10*3/uL (ref 150–400)
RBC: 2.65 MIL/uL — ABNORMAL LOW (ref 3.87–5.11)
RBC: 3.32 MIL/uL — ABNORMAL LOW (ref 3.87–5.11)
RDW: 14.7 % (ref 11.5–15.5)
RDW: 14.9 % (ref 11.5–15.5)
WBC: 14.8 10*3/uL — ABNORMAL HIGH (ref 4.0–10.5)
WBC: 14 10*3/uL — ABNORMAL HIGH (ref 4.0–10.5)
nRBC: 0.2 % (ref 0.0–0.2)
nRBC: 0.4 % — ABNORMAL HIGH (ref 0.0–0.2)

## 2023-04-24 LAB — PREPARE RBC (CROSSMATCH)

## 2023-04-24 MED ORDER — SODIUM CHLORIDE 0.9% IV SOLUTION: Freq: Once | INTRAVENOUS | Status: AC

## 2023-04-24 MED ORDER — FERROUS SULFATE 325 (65 FE) MG PO TABS
325.0000 mg | ORAL_TABLET | ORAL | Status: DC
  Administered 2023-04-24: 325 mg via ORAL
  Filled 2023-04-24: qty 1

## 2023-04-24 NOTE — Plan of Care (Signed)
  Problem: Health Behavior/Discharge Planning: Goal: Ability to manage health-related needs will improve Outcome: Progressing   Problem: Clinical Measurements: Goal: Ability to maintain clinical measurements within normal limits will improve Outcome: Progressing Goal: Will remain free from infection Outcome: Progressing Goal: Diagnostic test results will improve Outcome: Progressing Goal: Respiratory complications will improve Outcome: Progressing Goal: Cardiovascular complication will be avoided Outcome: Progressing   Problem: Activity: Goal: Risk for activity intolerance will decrease Outcome: Progressing   Problem: Nutrition: Goal: Adequate nutrition will be maintained Outcome: Progressing   Problem: Coping: Goal: Level of anxiety will decrease Outcome: Progressing   Problem: Elimination: Goal: Will not experience complications related to bowel motility Outcome: Progressing Goal: Will not experience complications related to urinary retention Outcome: Progressing   Problem: Safety: Goal: Ability to remain free from injury will improve Outcome: Progressing   Problem: Skin Integrity: Goal: Risk for impaired skin integrity will decrease Outcome: Progressing   Problem: Education: Goal: Knowledge of the prescribed therapeutic regimen will improve Outcome: Progressing Goal: Understanding of sexual limitations or changes related to disease process or condition will improve Outcome: Progressing Goal: Individualized Educational Video(s) Outcome: Progressing   Problem: Self-Concept: Goal: Communication of feelings regarding changes in body function or appearance will improve Outcome: Progressing   Problem: Skin Integrity: Goal: Demonstration of wound healing without infection will improve Outcome: Progressing   Problem: Education: Goal: Knowledge of condition will improve Outcome: Progressing Goal: Individualized Educational Video(s) Outcome: Progressing Goal:  Individualized Newborn Educational Video(s) Outcome: Progressing   Problem: Activity: Goal: Will verbalize the importance of balancing activity with adequate rest periods Outcome: Progressing Goal: Ability to tolerate increased activity will improve Outcome: Progressing   Problem: Coping: Goal: Ability to identify and utilize available resources and services will improve Outcome: Progressing   Problem: Life Cycle: Goal: Chance of risk for complications during the postpartum period will decrease Outcome: Progressing   Problem: Role Relationship: Goal: Ability to demonstrate positive interaction with newborn will improve Outcome: Progressing   Problem: Skin Integrity: Goal: Demonstration of wound healing without infection will improve Outcome: Progressing

## 2023-04-24 NOTE — Lactation Note (Signed)
 This note was copied from a baby's chart.  NICU Lactation Consultation Note  Patient Name: Victoria Cline Date: 04/24/2023 Age:31 hours  Reason for consult: Follow-up assessment; NICU baby; Primapara; 1st time breastfeeding; Preterm <34wks; Infant < 6lbs; Maternal endocrine disorder Type of Endocrine Disorder?: Diabetes  SUBJECTIVE  LC in to visit with P1 Mom of preterm baby Victoria Cline.  Mom has been consistently double pumping, still not getting any colostrum to take to baby.  Reassured her that it can take 4-6 days.  Mom also has a low Hgb and will be getting PRBCs this am.    Encouraged continued pumping and breast massaging.  Mom is very tired, so encouraged sleep also.   Mom aware of [facility] loaner available to her if DC'd on a weekend, or before [facility] can provide her with a pump.  Mom also aware of DEBP in baby's room that she can use if she chooses to room-in.  OBJECTIVE Infant data: No data recorded O2 Device: CPAP FiO2 (%): 21 %  Infant feeding assessment No data recorded  Maternal data: [id number] C-Section, Low Transverse Has patient been taught Hand Expression?: Yes Hand Expression Comments: drops noted Significant Breast History:: ++ breast changes Current breast feeding challenges:: infant separation Does the patient have breastfeeding experience prior to this delivery?: No Pumping frequency: 8 times per 24 hrs Pumped volume: 0 mL Flange Size: 18 Hands-free pumping top sizes: Small/Medium (Blue) Risk factor for low/delayed milk supply:: infant separation, admission to NICU  WIC Program: Yes WIC Referral Sent?: Yes What county?: [place]  ASSESSMENT Infant:  No data recorded Maternal: Milk volume: Normal  INTERVENTIONS/PLAN Interventions: Interventions: Breast feeding basics reviewed; Skin to skin; Breast massage; Hand express; DEBP Tools: Pump; Flanges; Hands-free pumping top Pump Education: Setup, frequency, and cleaning; Milk  Storage  Plan: Consult Status: NICU follow-up NICU Follow-up type: Verify onset of copious milk; Verify absence of engorgement; Verify DEBP issuance; Maternal D/C visit   Victoria Cline 04/24/2023, 9:16 AM

## 2023-04-24 NOTE — Progress Notes (Signed)
 POD1 s/p 1LTCS d/t VB in setting of previa  Subjective: up ad lib, voiding, tolerating PO, + flatus, and notes fatigue. Some trouble with pumping.   Objective: Blood pressure (!) 111/53, pulse 86, temperature 98.3 F (36.8 C), temperature source Oral, resp. rate 18, height 5' 3 (1.6 m), weight 73.9 kg, last menstrual period 08/28/2022, SpO2 99%, unknown if currently breastfeeding.  Physical Exam:  General: alert, cooperative, and no distress Lochia: appropriate Uterine Fundus: firm Incision: healing well DVT Evaluation: No evidence of DVT seen on physical exam.  Recent Labs    04/23/23 0332 04/24/23 0550  HGB 10.0* 6.7*  HCT 31.0* 21.2*    Assessment/Plan: Postpartum - Contraception: Undecided - MOF: Breast - Rh status: Positive - Rubella status: Immune - Dispo: POD3 - Consults: Lactation  Acute blood loss anemia - Will transfuse 2 units. Reviewed risks/benefits and pt gave verbal consent.  - Will start PO iron  Neonatal - Doing well   LOS: 8 days   Vina Solian 04/24/2023, 7:25 AM

## 2023-04-24 NOTE — Progress Notes (Signed)
 Progress Note  Called to see patient for uncontrolled pain despite receiving tylenol /ibuprofen  as scheduled and oxycodone  10mg  2 hours ago.   Patient reports that when she got up to the bathroom, she noticed sharp severe RLQ pain. Improved as she was voiding, but then returned when she got up to walk back from the bathroom. No nausea/vomiting, chills, CP/SOB, lightheadedness/dizziness. Vital signs overall reassuring - mild tachycardia to 109, BP 117/75, 98% on RA. Abd soft, mildly distended, moderate RLQ tenderness without rebound or guarding.  She is s/p 2u pRBCs ~ 30-62min ago. Will repeat CBC now. Can give IV dilaudid  x 1 for breakthrough pain. Abdominal binder ordered.   Low threshold for repeat evaluation if symptoms do not improve, vital signs change, or inappropriate rise in Hgb.   Kieth Carolin, MD Obstetrician & Gynecologist, [facility] for [employer], [facility]

## 2023-04-25 LAB — COMPREHENSIVE METABOLIC PANEL
ALT: 18 U/L (ref 0–44)
AST: 20 U/L (ref 15–41)
Albumin: 2.3 g/dL — ABNORMAL LOW (ref 3.5–5.0)
Alkaline Phosphatase: 50 U/L (ref 38–126)
Anion gap: 8 (ref 5–15)
BUN: 10 mg/dL (ref 6–20)
CO2: 23 mmol/L (ref 22–32)
Calcium: 8.7 mg/dL — ABNORMAL LOW (ref 8.9–10.3)
Chloride: 104 mmol/L (ref 98–111)
Creatinine, Ser: 0.5 mg/dL (ref 0.44–1.00)
GFR, Estimated: >60 mL/min (ref >60)
Glucose, Bld: 86 mg/dL (ref 70–99)
Potassium: 4.2 mmol/L (ref 3.5–5.1)
Sodium: 135 mmol/L (ref 135–145)
Total Bilirubin: 0.6 mg/dL (ref 0.0–1.2)
Total Protein: 5 g/dL — ABNORMAL LOW (ref 6.5–8.1)

## 2023-04-25 LAB — BPAM RBC
Blood Product Expiration Date: 202502022359
Blood Product Expiration Date: 202502012359
ISSUE DATE / TIME: 202501110921
ISSUE DATE / TIME: 202501111236
Unit Type and Rh: 202502022359
Unit Type and Rh: 7300
Unit Type and Rh: 7300

## 2023-04-25 LAB — TYPE AND SCREEN
ABO/RH(D): B POS
Antibody Screen: NEGATIVE
Unit division: 0
Unit division: 0

## 2023-04-25 LAB — CBC
HCT: 29.3 % — ABNORMAL LOW (ref 36.0–46.0)
Hemoglobin: 9.7 g/dL — ABNORMAL LOW (ref 12.0–15.0)
MCH: 26.5 pg (ref 26.0–34.0)
MCHC: 33.1 g/dL (ref 30.0–36.0)
MCV: 80.1 fL (ref 80.0–100.0)
Platelets: 260 10*3/uL (ref 150–400)
RBC: 3.66 MIL/uL — ABNORMAL LOW (ref 3.87–5.11)
RDW: 15.1 % (ref 11.5–15.5)
WBC: 13.7 10*3/uL — ABNORMAL HIGH (ref 4.0–10.5)
nRBC: 1.5 % — ABNORMAL HIGH (ref 0.0–0.2)

## 2023-04-25 MED ORDER — POLYETHYLENE GLYCOL 3350 17 G PO PACK
17.0000 g | PACK | Freq: Every day | ORAL | Status: DC
Start: 2023-04-25 — End: 2023-04-26
  Administered 2023-04-25: 17 g via ORAL
  Filled 2023-04-25: qty 1

## 2023-04-25 MED ORDER — OXYCODONE HCL 5 MG PO TABS
10.0000 mg | ORAL_TABLET | ORAL | Status: DC | PRN
  Administered 2023-04-25 – 2023-04-26 (×5): 15 mg via ORAL
  Filled 2023-04-25 (×5): qty 3

## 2023-04-25 MED ORDER — GABAPENTIN 300 MG PO CAPS
300.0000 mg | ORAL_CAPSULE | Freq: Three times a day (TID) | ORAL | Status: DC
  Administered 2023-04-25 – 2023-04-26 (×4): 300 mg via ORAL
  Filled 2023-04-25 (×4): qty 1

## 2023-04-25 NOTE — Progress Notes (Addendum)
 POD3 s/p 1LTCS d/t VB in setting of previa  Subjective: Ambulating, voiding and + flatus. Pain overall improved but had severe pain this morning due to constipation. She is now s/p enema and feels much improved.   Objective: Blood pressure 132/72, pulse 98, temperature 97.8 F (36.6 C), temperature source Oral, resp. rate 20, height 5' 3 (1.6 m), weight 73.9 kg, last menstrual period 08/28/2022, SpO2 98%, unknown if currently breastfeeding.  Physical Exam:  General: alert, cooperative, and no distress Lochia: appropriate Uterine Fundus: firm Abdomen: Soft, nontender, distended Incision: healing well DVT Evaluation: No evidence of DVT seen on physical exam.  Recent Labs    04/24/23 1707 04/25/23 0815  HGB 8.7* 9.7*  HCT 26.8* 29.3*    Assessment/Plan: Postpartum - Contraception: Depo - MOF: Breast - Rh status: Positive - Rubella status: Immune - Dispo: POD3 - Consults: Lactation  Abdominal pain - Adjust pain medication dosages. Much improved s/p enema and s/p medication adjustments yesterday. Reviewed causes of constipation.  - Add miralax  to bowel regimen.  - RN will encourage ambulation.   Acute blood loss anemia - s/p 2 units PRBC - PO iron started  Neonatal - Doing well in NICU - She desires circ prior to discharge. See consent on baby's chart.   Anticipate d/c home today but will PM check on her to ensure she is doing well.    LOS: 10 days   Vina Solian 04/26/2023, 7:29 AM

## 2023-04-26 ENCOUNTER — Ambulatory Visit (HOSPITAL_COMMUNITY): Payer: Self-pay

## 2023-04-26 ENCOUNTER — Other Ambulatory Visit (HOSPITAL_COMMUNITY): Payer: Self-pay

## 2023-04-26 DIAGNOSIS — D62 Acute posthemorrhagic anemia: Secondary | ICD-10-CM | POA: Diagnosis not present

## 2023-04-26 LAB — SURGICAL PATHOLOGY

## 2023-04-26 MED ORDER — IBUPROFEN 600 MG PO TABS
600.0000 mg | ORAL_TABLET | Freq: Four times a day (QID) | ORAL | 0 refills | Status: DC
  Filled 2023-04-26: qty 30, 8d supply, fill #0

## 2023-04-26 MED ORDER — POLYETHYLENE GLYCOL 3350 17 GM/SCOOP PO POWD
17.0000 g | Freq: Every day | ORAL | 0 refills | Status: DC
  Filled 2023-04-26: qty 238, 14d supply, fill #0

## 2023-04-26 MED ORDER — OXYCODONE HCL 10 MG PO TABS
10.0000 mg | ORAL_TABLET | ORAL | 0 refills | Status: DC | PRN
  Filled 2023-04-26: qty 30, 5d supply, fill #0

## 2023-04-26 MED ORDER — ACETAMINOPHEN 500 MG PO TABS
1000.0000 mg | ORAL_TABLET | Freq: Four times a day (QID) | ORAL | 0 refills | Status: DC
  Filled 2023-04-26: qty 30, 4d supply, fill #0

## 2023-04-26 MED ORDER — SIMETHICONE 80 MG PO CHEW
80.0000 mg | CHEWABLE_TABLET | Freq: Three times a day (TID) | ORAL | 0 refills | Status: DC
  Filled 2023-04-26: qty 30, 10d supply, fill #0

## 2023-04-26 MED ORDER — GABAPENTIN 300 MG PO CAPS
300.0000 mg | ORAL_CAPSULE | Freq: Three times a day (TID) | ORAL | 0 refills | Status: DC
  Filled 2023-04-26: qty 90, 30d supply, fill #0

## 2023-04-26 MED ORDER — SENNOSIDES-DOCUSATE SODIUM 8.6-50 MG PO TABS
2.0000 | ORAL_TABLET | Freq: Every day | ORAL | 0 refills | Status: DC
  Filled 2023-04-26: qty 30, 15d supply, fill #0

## 2023-04-26 NOTE — Progress Notes (Signed)
 Enema given to patient. Patient now experiencing relief from constipation.

## 2023-04-26 NOTE — Lactation Note (Signed)
 This note was copied from a baby's chart.  NICU Lactation Consultation Note  Patient Name: Victoria Cline Unijb'd Date: 04/26/2023 Age:31 days  Reason for consult: Follow-up assessment; Primapara; 1st time breastfeeding; NICU baby; Preterm <34wks; Infant < 6lbs; Maternal endocrine disorder Type of Endocrine Disorder?: Diabetes (GDM)  SUBJECTIVE  LC in to visit with P1 Mom of baby [name] in the NICU.  Mom was discharged from [facility] today without a DEBP for home use.  LC assisted Mom to pump using the Medela Symphony in baby's room and provided her with another pumping top.  No engorgement noted.  Last pumping, Mom expressed 35 ml.  Mom aware of the importance of continued consistent double pumping to support her milk supply.  LC offered a [facility] loaner pump, but Mom declined.  Mom has a [employer]inoh pump at home and a hand pump.  WIC did call her today and they are going to set up something, hopefully tomorrow.   Engorgement prevention and treatment reviewed.    Mom encouraged to do STS when able to and pump after when her milk producing hormones are stimulated.   OBJECTIVE Infant data: Mother's Current Feeding Choice: Breast Milk and Donor Milk  O2 Device: HHFNC O2 Flow Rate (L/min): 4 L/min FiO2 (%): 21 %  Infant feeding assessment No data recorded  Maternal data: [id number] C-Section, Low Transverse Pumping frequency: 8 times per 24 hrs Pumped volume: 35 mL Flange Size: 18 Hands-free pumping top sizes: Small/Medium (Blue)  WIC Program: Yes WIC Referral Sent?: Yes What county?: [place] (WIC called Mom) Pump: Manual (declined the [facility] loaner from the hospital)  ASSESSMENT Infant: Feeding Status: Scheduled 9-12-3-6 Feeding method: Tube/Gavage (Bolus)  Maternal: Milk volume: Normal  INTERVENTIONS/PLAN Interventions: Interventions: Skin to skin; Breast massage; Hand express; DEBP; Hand pump; Education Discharge Education: Engorgement and breast care Tools: Pump; Flanges;  Hands-free pumping top Pump Education: Setup, frequency, and cleaning; Milk Storage  Plan: 1- STS with baby when able to 2- Pump both breasts every 3 hrs, goal of 8 times per 24 hrs 3-breast massage during pumping 4- Obtain a DEBP from [facility]  Consult Status: NICU follow-up NICU Follow-up type: Verify absence of engorgement; Verify onset of copious milk; Verify DEBP issuance   [name] 04/26/2023, 5:55 PM

## 2023-04-26 NOTE — Lactation Note (Addendum)
 This note was copied from a baby's chart. Lactation Consultation Note  Patient Name: Victoria Cline Date: 04/26/2023 Age:31 hours   LC attempted to visit with P1 Mom of baby in the NICU.  Mom was discharged from [facility] this am, room empty and not cleaned yet.  LC noticed the pump tubing still on pump in room.  LC took the tubing to baby's room where she found the 2 basins for washing and drying.  RN feels that Mom is returning today to stay with baby.  RN aware of Mom not having a DEBP at home and will call [facility] when Mom returns.  [name] RN IBCLC 04/26/2023, 1:01 PM

## 2023-04-30 ENCOUNTER — Ambulatory Visit: Payer: No Typology Code available for payment source

## 2023-04-30 VITALS — BP 128/85 | HR 96 | Wt 167.6 lb

## 2023-04-30 DIAGNOSIS — O4403 Placenta previa specified as without hemorrhage, third trimester: Secondary | ICD-10-CM

## 2023-04-30 NOTE — Progress Notes (Signed)
Subjective:  Victoria Cline is a 33 y.o. female here for BP check and incision check.  Hypertension ROS: taking medications as instructed, no medication side effects noted, no TIA's, no chest pain on exertion, and no dyspnea on exertion.   Pt reports incision covered  Objective:  LMP 08/28/2022   Appearance alert, well appearing, and in no distress. Incision healing well   Assessment:   Blood Pressure today in office well controlled.  Incision healed nicely, no signs of infection, scar looks good  Plan:  Current treatment plan is effective, no change in therapy.[name]

## 2023-05-04 ENCOUNTER — Encounter: Payer: Self-pay | Admitting: *Deleted

## 2023-05-04 ENCOUNTER — Ambulatory Visit (HOSPITAL_COMMUNITY): Payer: Self-pay

## 2023-05-04 NOTE — Lactation Note (Signed)
This note was copied from a baby's chart.  NICU Lactation Consultation Note  Patient Name: Victoria Cline [id number] Date: 05/04/2023 Age:31 days  Reason for consult: Weekly NICU follow-up; Primapara; 1st time breastfeeding; NICU baby; Preterm <34wks; Infant < 6lbs; Maternal endocrine disorder; Other (Comment) (Cord (+) for THC) Type of Endocrine Disorder?: Diabetes (GDM)  SUBJECTIVE Visited with family of 31 23/83 weeks old AGA NICU female; Victoria Cline is a P1 and reports she's been pumping consistently, her supply has increased, praised her for her efforts. Her plan is to take baby "Victoria Cline" to breast once he's ready. She voiced to NICU RN Victoria Cline that she would like to exclusively breastfeed but that she wasn't going to be able to stay for more than one feeding/day. Let he know that we can assist with latching and positioning; she's aware that baby would also need to PO if he's ready preferably at the breast if she is here, if that is not possible, then via bottle. Reviewed pumping schedule and strategies to increase supply.   OBJECTIVE Infant data: Mother's Current Feeding Choice: Breast Milk  O2 Device: Room Air O2 Flow Rate (L/min): 2 L/min FiO2 (%): 21 %  Infant feeding assessment IDFTS - Readiness: 3 IDFTS - Quality: 5   Maternal data: [id number] C-Section, Low Transverse Current breast feeding challenges:: NICU admission Pumping frequency: 7 times/24 hours Pumped volume: 90 mL (60-90 ml; mostly 90 ml) Risk factor for low/delayed milk supply:: C/S, primipara, prematurity, GDM, infant separation  WIC Program: Yes WIC Referral Sent?: Yes What county?: [place] (WIC called Mom) Pump: WIC Pump  ASSESSMENT Infant: Feeding Status: Scheduled 9-12-3-6 Feeding method: Tube/Gavage (Bolus)  Maternal: Milk volume: Normal (borderline low)  INTERVENTIONS/PLAN Interventions: Interventions: Breast feeding basics reviewed; DEBP; Education  Plan: Encouraged pumping every 3 hours,  ideally 8 pumping sessions/24 hours She'll try power pumping in the AM STS care whenever possible  She'll call for assistance when baby Victoria Cline is ready to go to breast  FOB and female visitor present. All questions and concerns answered, family to contact [facility] services PRN.  Consult Status: NICU follow-up NICU Follow-up type: Weekly NICU follow up   Victoria Cline 05/04/2023, 6:15 PM

## 2023-05-05 ENCOUNTER — Encounter: Payer: No Typology Code available for payment source | Admitting: Family Medicine

## 2023-05-11 ENCOUNTER — Ambulatory Visit (HOSPITAL_COMMUNITY): Payer: Self-pay

## 2023-05-11 NOTE — Lactation Note (Signed)
This note was copied from a baby's chart.  NICU Lactation Consultation Note  Patient Name: Victoria Cline [id number] Date: 05/11/2023 Age:31 wk.o.  Reason for consult: Follow-up assessment; NICU baby; Primapara; 1st time breastfeeding; Preterm <34wks; Infant < 6lbs; Maternal endocrine disorder Type of Endocrine Disorder?: Diabetes (GDM) Umbilical cord drug screen, + THC  SUBJECTIVE LC in to visit with P1 Mom of baby "Victoria Cline" in the NICU.  Mom holding a swaddled baby while in recliner.  LC asked if she would like to hold baby STS.  Mom was concerned about baby just being bathed and getting cold.  LC reassured her that baby would be the warmest STS.  Mom and LC unwrapped baby, but Mom did not want to take baby down to a diaper.  Baby sleeping comfortably on Mom's chest.  Educated Mom on the benefits to baby and to her doing STS often.   Mom states she wants to breastfeed baby.  LC assured her that doing STS (diaper only) is the first step to breastfeeding.  Talked about hand expressing drops onto nipple and touching baby's lips to the drops in the next week or two.  Talked about feeding cues and what to look for.   Mom denies having any questions.  Encouraged her to ask for LC prn.  OBJECTIVE Infant data: No data recorded O2 Device: Room Air  Infant feeding assessment No data recorded  Maternal data: [id number] C-Section, Low Transverse Pumping frequency: 8 times per 24 hrs Pumped volume: 90 mL Flange Size: 18 Hands-free pumping top sizes: Small/Medium (Blue)  WIC Program: Yes WIC Referral Sent?: Yes What county?: [place] (WIC called Mom) Pump: WIC Pump  ASSESSMENT Infant:  Feeding Status: Scheduled 9-12-3-6 Feeding method: Tube/Gavage (Bolus)  Maternal: Milk volume: Normal  INTERVENTIONS/PLAN Interventions: Interventions: Skin to skin; Breast massage; Hand express; DEBP Tools: Pump; Flanges; Hands-free pumping top  Plan: 1- STS with baby during care times 2- breast  massage and hand express often 3- Pump both breasts 8 times per 24 hrs  Consult Status: NICU follow-up NICU Follow-up type: Weekly NICU follow up   Judee Clara 05/11/2023, 4:40 PM

## 2023-05-19 ENCOUNTER — Encounter: Payer: No Typology Code available for payment source | Admitting: Obstetrics & Gynecology

## 2023-05-22 ENCOUNTER — Ambulatory Visit (HOSPITAL_COMMUNITY): Payer: Self-pay

## 2023-05-22 NOTE — Lactation Note (Signed)
 This note was copied from a baby's chart.  NICU Lactation Consultation Note  Patient Name: Victoria Cline Date: 05/22/2023 Age:31 wk.o.  Reason for consult: Follow-up assessment; NICU baby; Primapara; 1st time breastfeeding; Late-preterm 34-36.6wks; Maternal endocrine disorder Type of Endocrine Disorder?: Diabetes (GDM)  SUBJECTIVE  LC in to visit with P1 Mom of baby [name] in the NICU.  Baby was made ad lib today.  LC asked Mom about offering the breast today or tomorrow.  Mom politely declined saying baby is doing well on bottles.  Mom currently pacing baby with his feeding by bottle.  Mom admits to slowing down her pumping frequency.  Mom states she still expresses 60 ml, but isn't pumping as often.    LC reminded Mom that she still could offer the breast and ask for lactation support.  Mom said she understands.  OBJECTIVE Infant data: No data recorded O2 Device: Room Air  Infant feeding assessment IDFTS - Readiness: 2 IDFTS - Quality: 2   Maternal data: [id number] C-Section, Low Transverse Pumping frequency: 6 times per 24 hrs Pumped volume: 60 mL Flange Size: 18  WIC Program: Yes WIC Referral Sent?: Yes What county?: [place] (WIC called Mom) Pump: WIC Pump  ASSESSMENT Infant:   Feeding Status: Ad lib Feeding method: Bottle Nipple Type: Nfant Extra Slow Flow (gold) (with Dr. Delores system)  Maternal: Milk volume: Normal (low normal)  INTERVENTIONS/PLAN Interventions: Interventions: Breast feeding basics reviewed; Skin to skin; Breast massage; Hand express; DEBP Tools: Pump; Flanges Pump Education: Setup, frequency, and cleaning; Milk Storage  Plan: Consult Status: NICU follow-up NICU Follow-up type: Weekly NICU follow up   [name] 05/22/2023, 4:19 PM

## 2023-05-24 ENCOUNTER — Ambulatory Visit (HOSPITAL_COMMUNITY): Payer: Self-pay

## 2023-05-24 NOTE — Lactation Note (Signed)
 This note was copied from a baby's chart.  NICU Lactation Consultation Note  Patient Name: Victoria Cline [id number] Date: 05/24/2023 Age:31 wk.o.  Reason for consult: Weekly NICU follow-up; Primapara; 1st time breastfeeding; NICU baby; Late-preterm 34-36.6wks; Other (Comment); Exclusive pumping and bottle feeding (Cord (+) for THC) Type of Endocrine Disorder?: Diabetes (GDM)  SUBJECTIVE Visited with family of 72 48/57 weeks old AGA NICU female; Victoria Cline is a P1 and reported she's pumping for baby "Victoria Cline" praised her for her efforts. Noted that pumping frequency has decreased, explained the importance of pumping whenever baby is getting a bottle to protect her supply, she voiced she's planning on doing both breastmilk and formula and will use it as supplementation to her breastmilk. Parents are taking baby "Victoria Cline" home today today. Reviewed discharge education and the importance of consistent pumping to protect her supply. She politely declined a referral to [facility] OP as her plan is to exclusively pump and bottle feeding. Victoria Cline present. All questions and concerns answered; family to contact [facility] services PRN.  OBJECTIVE Infant data: Mother's Current Feeding Choice: Breast Milk and Formula  O2 Device: Room Air  Infant feeding assessment IDFTS - Readiness: 2 IDFTS - Quality: 2   Maternal data: [id number] C-Section, Low Transverse Pumping frequency: 4 times/24 hours Pumped volume: 80 mL  WIC Program: Yes WIC Referral Sent?: Yes What county?: [place] (WIC called Mom) Pump: WIC Pump  ASSESSMENT Infant: Feeding Status: Ad lib Feeding method: Bottle Nipple Type: Nfant Extra Slow Flow (gold) (in dr brown bottle)  Maternal: Milk volume: Low  INTERVENTIONS/PLAN Interventions: Discharge Education: Outpatient recommendation  Plan: Consult Status: Complete   Abigaile Rossie Newman Bare 05/24/2023, 12:38 PM

## 2023-05-27 ENCOUNTER — Other Ambulatory Visit (HOSPITAL_BASED_OUTPATIENT_CLINIC_OR_DEPARTMENT_OTHER): Payer: Self-pay

## 2023-06-01 ENCOUNTER — Other Ambulatory Visit: Payer: No Typology Code available for payment source

## 2023-06-01 ENCOUNTER — Ambulatory Visit: Payer: No Typology Code available for payment source | Admitting: Obstetrics and Gynecology

## 2023-06-02 ENCOUNTER — Encounter: Payer: No Typology Code available for payment source | Admitting: Family Medicine

## 2023-06-08 ENCOUNTER — Ambulatory Visit: Payer: No Typology Code available for payment source | Admitting: Obstetrics & Gynecology

## 2023-06-08 ENCOUNTER — Other Ambulatory Visit: Payer: No Typology Code available for payment source

## 2023-06-09 ENCOUNTER — Encounter: Payer: No Typology Code available for payment source | Admitting: Family Medicine

## 2023-07-02 ENCOUNTER — Ambulatory Visit: Payer: No Typology Code available for payment source | Admitting: Family Medicine

## 2023-07-02 ENCOUNTER — Inpatient Hospital Stay: Admit: 2023-07-02 | Payer: No Typology Code available for payment source

## 2023-07-15 ENCOUNTER — Encounter: Payer: Self-pay | Admitting: *Deleted

## 2023-07-20 ENCOUNTER — Ambulatory Visit: Admitting: Obstetrics & Gynecology

## 2023-11-15 ENCOUNTER — Other Ambulatory Visit (HOSPITAL_BASED_OUTPATIENT_CLINIC_OR_DEPARTMENT_OTHER): Payer: Self-pay

## 2024-01-29 ENCOUNTER — Other Ambulatory Visit: Payer: Self-pay

## 2024-01-29 ENCOUNTER — Encounter: Payer: Self-pay | Admitting: *Deleted

## 2024-01-29 ENCOUNTER — Ambulatory Visit
Admission: EM | Admit: 2024-01-29 | Discharge: 2024-01-29 | Disposition: A | Discharge status: Home / Self Care | Attending: Emergency Medicine | Admitting: Emergency Medicine

## 2024-01-29 ENCOUNTER — Ambulatory Visit

## 2024-01-29 DIAGNOSIS — R0789 Other chest pain: Secondary | ICD-10-CM | POA: Diagnosis not present

## 2024-01-29 DIAGNOSIS — M546 Pain in thoracic spine: Secondary | ICD-10-CM | POA: Diagnosis not present

## 2024-01-29 DIAGNOSIS — M545 Low back pain, unspecified: Secondary | ICD-10-CM

## 2024-01-29 DIAGNOSIS — M542 Cervicalgia: Secondary | ICD-10-CM

## 2024-01-29 MED ORDER — CYCLOBENZAPRINE HCL 10 MG PO TABS: 10.0000 mg | ORAL_TABLET | Freq: Two times a day (BID) | ORAL | 0 refills | Status: AC | PRN

## 2024-01-29 MED ORDER — KETOROLAC TROMETHAMINE 30 MG/ML IJ SOLN: 30.0000 mg | Freq: Once | INTRAMUSCULAR | Status: DC

## 2024-01-29 MED ORDER — OXYCODONE-ACETAMINOPHEN 5-325 MG PO TABS: ORAL_TABLET | ORAL | 0 refills | Status: AC

## 2024-01-29 MED ORDER — KETOROLAC TROMETHAMINE 10 MG PO TABS: 10.0000 mg | ORAL_TABLET | Freq: Four times a day (QID) | ORAL | 0 refills | Status: AC | PRN

## 2024-01-29 MED ADMIN — Ketorolac Tromethamine Inj 15 MG/ML: 30 mg | INTRAMUSCULAR | NDC 72266023401
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
## 2024-01-29 NOTE — Discharge Instructions (Addendum)
 A Toradol  injection was given today. I have sent the Toradol  tablets to your pharmacy, to take 1 pill every 6 hours as needed for pain.  Please use for only 3 days in a row. Please do not use any NSAIDs (ibuprofen /Advil , naproxen /Aleve , etc) while taking.   You can take the muscle relaxer Flexeril  twice daily. If the medication makes you drowsy, take only at bed time.  I have also prescribed 3 tablets of Percocet.  Please note this is a narcotic medication. It has addictive properties and should be used sparingly. It will make you drowsy. Do not drink, drive, or make judgement decisions if you choose to take this medication.  Please go to the emergency department if symptoms worsen or become severe

## 2024-03-15 ENCOUNTER — Other Ambulatory Visit: Payer: Self-pay | Admitting: Medical Genetics
